# Patient Record
Sex: Female | Born: 1945 | Race: White | Hispanic: No | Marital: Married | Smoking: Never smoker
Health system: Southern US, Community
[De-identification: ages and names within clinical notes are randomized; demographics above are authoritative.]

## PROBLEM LIST (undated history)

## (undated) DIAGNOSIS — R011 Cardiac murmur, unspecified: Secondary | ICD-10-CM

## (undated) DIAGNOSIS — I10 Essential (primary) hypertension: Secondary | ICD-10-CM

## (undated) DIAGNOSIS — F419 Anxiety disorder, unspecified: Secondary | ICD-10-CM

## (undated) DIAGNOSIS — M1990 Unspecified osteoarthritis, unspecified site: Secondary | ICD-10-CM

## (undated) DIAGNOSIS — G43909 Migraine, unspecified, not intractable, without status migrainosus: Secondary | ICD-10-CM

## (undated) DIAGNOSIS — M810 Age-related osteoporosis without current pathological fracture: Secondary | ICD-10-CM

## (undated) DIAGNOSIS — E785 Hyperlipidemia, unspecified: Secondary | ICD-10-CM

## (undated) DIAGNOSIS — K649 Unspecified hemorrhoids: Secondary | ICD-10-CM

## (undated) HISTORY — DX: Unspecified osteoarthritis, unspecified site: M19.90

## (undated) HISTORY — PX: CERVICAL FUSION: SHX112

## (undated) HISTORY — DX: Migraine, unspecified, not intractable, without status migrainosus: G43.909

## (undated) HISTORY — DX: Essential (primary) hypertension: I10

## (undated) HISTORY — DX: Age-related osteoporosis without current pathological fracture: M81.0

## (undated) HISTORY — DX: Unspecified hemorrhoids: K64.9

## (undated) HISTORY — PX: UPPER GASTROINTESTINAL ENDOSCOPY: SHX188

## (undated) HISTORY — PX: HEMORRHOID BANDING: SHX5850

## (undated) HISTORY — DX: Hyperlipidemia, unspecified: E78.5

## (undated) HISTORY — DX: Anxiety disorder, unspecified: F41.9

## (undated) HISTORY — PX: COLONOSCOPY: SHX174

## (undated) HISTORY — PX: OTHER SURGICAL HISTORY: SHX169

## (undated) HISTORY — PX: LUMBAR DISC SURGERY: SHX700

## (undated) HISTORY — DX: Cardiac murmur, unspecified: R01.1

## (undated) HISTORY — PX: BUNIONECTOMY: SHX129

## (undated) HISTORY — PX: SPINE SURGERY: SHX786

## (undated) HISTORY — PX: POLYPECTOMY: SHX149

---

## 2008-10-15 ENCOUNTER — Ambulatory Visit: Payer: Self-pay | Admitting: Internal Medicine

## 2008-10-23 ENCOUNTER — Encounter: Payer: Self-pay | Admitting: Internal Medicine

## 2008-10-23 ENCOUNTER — Ambulatory Visit: Payer: Self-pay | Admitting: Internal Medicine

## 2008-10-26 ENCOUNTER — Encounter: Payer: Self-pay | Admitting: Internal Medicine

## 2009-04-05 ENCOUNTER — Encounter: Admission: RE | Admit: 2009-04-05 | Discharge: 2009-04-05 | Payer: Self-pay | Admitting: Chiropractic Medicine

## 2010-10-24 ENCOUNTER — Ambulatory Visit (AMBULATORY_SURGERY_CENTER): Payer: Managed Care, Other (non HMO) | Admitting: *Deleted

## 2010-10-24 ENCOUNTER — Encounter: Payer: Self-pay | Admitting: Internal Medicine

## 2010-10-24 VITALS — Ht 63.0 in | Wt 107.0 lb

## 2010-10-24 DIAGNOSIS — Z86010 Personal history of colonic polyps: Secondary | ICD-10-CM

## 2010-10-24 MED ORDER — PEG-KCL-NACL-NASULF-NA ASC-C 100 G PO SOLR: ORAL | Status: DC

## 2010-11-07 ENCOUNTER — Encounter: Payer: Self-pay | Admitting: Internal Medicine

## 2010-11-07 ENCOUNTER — Ambulatory Visit (AMBULATORY_SURGERY_CENTER): Payer: Managed Care, Other (non HMO) | Admitting: Internal Medicine

## 2010-11-07 VITALS — BP 129/82 | HR 58 | Temp 95.6°F | Resp 17 | Ht 63.0 in | Wt 105.0 lb

## 2010-11-07 DIAGNOSIS — Z1211 Encounter for screening for malignant neoplasm of colon: Secondary | ICD-10-CM

## 2010-11-07 DIAGNOSIS — Z85038 Personal history of other malignant neoplasm of large intestine: Secondary | ICD-10-CM

## 2010-11-07 DIAGNOSIS — D126 Benign neoplasm of colon, unspecified: Secondary | ICD-10-CM

## 2010-11-07 DIAGNOSIS — Z800 Family history of malignant neoplasm of digestive organs: Secondary | ICD-10-CM

## 2010-11-07 DIAGNOSIS — Z86010 Personal history of colonic polyps: Secondary | ICD-10-CM

## 2010-11-07 MED ORDER — SODIUM CHLORIDE 0.9 % IV SOLN: 500.0000 mL | INTRAVENOUS | Status: DC

## 2010-11-07 NOTE — Patient Instructions (Signed)
Discharged instructions given with verbal understanding. Hand outs on polyps and diverticulosis given. Resume previous medications. 

## 2010-11-10 ENCOUNTER — Telehealth: Payer: Self-pay | Admitting: *Deleted

## 2010-11-10 NOTE — Telephone Encounter (Signed)

## 2011-04-14 ENCOUNTER — Other Ambulatory Visit: Payer: Self-pay | Admitting: Rehabilitation

## 2011-04-14 DIAGNOSIS — M542 Cervicalgia: Secondary | ICD-10-CM

## 2011-04-20 ENCOUNTER — Ambulatory Visit
Admission: RE | Admit: 2011-04-20 | Discharge: 2011-04-20 | Disposition: A | Discharge status: Home / Self Care | Payer: Managed Care, Other (non HMO) | Source: Ambulatory Visit | Attending: Rehabilitation | Admitting: Rehabilitation

## 2011-04-20 DIAGNOSIS — M542 Cervicalgia: Secondary | ICD-10-CM

## 2013-05-10 DIAGNOSIS — L03019 Cellulitis of unspecified finger: Secondary | ICD-10-CM | POA: Insufficient documentation

## 2013-08-28 ENCOUNTER — Encounter: Payer: Self-pay | Admitting: Internal Medicine

## 2013-10-23 ENCOUNTER — Ambulatory Visit (AMBULATORY_SURGERY_CENTER): Payer: Self-pay

## 2013-10-23 VITALS — Ht 63.0 in | Wt 105.0 lb

## 2013-10-23 DIAGNOSIS — Z860100 Personal history of colon polyps, unspecified: Secondary | ICD-10-CM

## 2013-10-23 MED ORDER — MOVIPREP 100 G PO SOLR: 1.0000 | Freq: Once | ORAL | Status: DC

## 2013-10-23 NOTE — Progress Notes (Signed)
No past problems with anesthesia No home oxygen No diet/weight loss meds No allergies to eggs or soy Has email. Emmi instructions given for colonoscopy

## 2013-10-24 ENCOUNTER — Telehealth: Payer: Self-pay | Admitting: Internal Medicine

## 2013-10-24 NOTE — Telephone Encounter (Signed)
Called pt back, pt states insurance did not cover prep and had a list that they would cover, advised pt I could give her a prep free if she wanted to come by and pick it up from 4th floor, pt states she will pick it up Thursday 10/26/13, left prep with pt's name on it at 4th floor front desk.-adm

## 2013-11-06 ENCOUNTER — Encounter: Payer: Self-pay | Admitting: Internal Medicine

## 2013-11-06 ENCOUNTER — Ambulatory Visit (AMBULATORY_SURGERY_CENTER): Payer: Medicare Other | Admitting: Internal Medicine

## 2013-11-06 VITALS — BP 111/75 | HR 52 | Temp 96.0°F | Resp 10 | Ht 63.0 in | Wt 105.0 lb

## 2013-11-06 DIAGNOSIS — D128 Benign neoplasm of rectum: Secondary | ICD-10-CM

## 2013-11-06 DIAGNOSIS — Z86010 Personal history of colonic polyps: Secondary | ICD-10-CM

## 2013-11-06 DIAGNOSIS — D129 Benign neoplasm of anus and anal canal: Secondary | ICD-10-CM

## 2013-11-06 DIAGNOSIS — D126 Benign neoplasm of colon, unspecified: Secondary | ICD-10-CM

## 2013-11-06 MED ORDER — SODIUM CHLORIDE 0.9 % IV SOLN: 500.0000 mL | INTRAVENOUS | Status: DC

## 2013-11-06 NOTE — Progress Notes (Signed)
Report to pacu rn, vss, bbs=clear 

## 2013-11-06 NOTE — Progress Notes (Signed)
Called to room to assist during endoscopic procedure.  Patient ID and intended procedure confirmed with present staff. Received instructions for my participation in the procedure from the performing physician.  

## 2013-11-06 NOTE — Patient Instructions (Signed)
Discharge instructions given with verbal understanding. Handouts on polyps and diverticulosis. Resume previous medications. YOU HAD AN ENDOSCOPIC PROCEDURE TODAY AT THE [facility]: Refer to the procedure report that was given to you for any specific questions about what was found during the examination.  If the procedure report does not answer your questions, please call your gastroenterologist to clarify.  If you requested that your care partner not be given the details of your procedure findings, then the procedure report has been included in a sealed envelope for you to review at your convenience later.  YOU SHOULD EXPECT: Some feelings of bloating in the abdomen. Passage of more gas than usual.  Walking can help get rid of the air that was put into your GI tract during the procedure and reduce the bloating. If you had a lower endoscopy (such as a colonoscopy or flexible sigmoidoscopy) you may notice spotting of blood in your stool or on the toilet paper. If you underwent a bowel prep for your procedure, then you may not have a normal bowel movement for a few days.  DIET: Your first meal following the procedure should be a light meal and then it is ok to progress to your normal diet.  A half-sandwich or bowl of soup is an example of a good first meal.  Heavy or fried foods are harder to digest and may make you feel nauseous or bloated.  Likewise meals heavy in dairy and vegetables can cause extra gas to form and this can also increase the bloating.  Drink plenty of fluids but you should avoid alcoholic beverages for 24 hours.  ACTIVITY: Your care partner should take you home directly after the procedure.  You should plan to take it easy, moving slowly for the rest of the day.  You can resume normal activity the day after the procedure however you should NOT DRIVE or use heavy machinery for 24 hours (because of the sedation medicines used during the test).    SYMPTOMS TO REPORT  IMMEDIATELY: A gastroenterologist can be reached at any hour.  During normal business hours, 8:30 AM to 5:00 PM Monday through Friday, call [phone].  After hours and on weekends, please call the GI answering service at [phone] who will take a message and have the physician on call contact you.   Following lower endoscopy (colonoscopy or flexible sigmoidoscopy):  Excessive amounts of blood in the stool  Significant tenderness or worsening of abdominal pains  Swelling of the abdomen that is new, acute  Fever of 100F or higher  FOLLOW UP: If any biopsies were taken you will be contacted by phone or by letter within the next 1-3 weeks.  Call your gastroenterologist if you have not heard about the biopsies in 3 weeks.  Our staff will call the home number listed on your records the next business day following your procedure to check on you and address any questions or concerns that you may have at that time regarding the information given to you following your procedure. This is a courtesy call and so if there is no answer at the home number and we have not heard from you through the emergency physician on call, we will assume that you have returned to your regular daily activities without incident.  SIGNATURES/CONFIDENTIALITY: You and/or your care partner have signed paperwork which will be entered into your electronic medical record.  These signatures attest to the fact that that the information above on your After Visit Summary   has been reviewed and is understood.  Full responsibility of the confidentiality of this discharge information lies with you and/or your care-partner. 

## 2013-11-06 NOTE — Op Note (Signed)
[facility]  [address] [place], [id number]   COLONOSCOPY PROCEDURE REPORT  PATIENT: [name], Katelyn  MR#: [id number] BIRTHDATE: 1945/07/05 , 68  yrs. old GENDER: Female ENDOSCOPIST: Eustace Quail, MD REFERRED LF:[id number] Program Recall PROCEDURE DATE:  11/06/2013 PROCEDURE:   Colonoscopy with snare polypectomy x 4 First Screening Colonoscopy - Avg.  risk and is 50 yrs.  old or older - No.  Prior Negative Screening - Now for repeat screening. N/A  History of Adenoma - Now for follow-up colonoscopy & has been > or = to 3 yrs.  Yes hx of adenoma.  Has been 3 or more years since last colonoscopy.  Polyps Removed Today? Yes. ASA CLASS:   Class II INDICATIONS:Patient's personal history of adenomatous colon polyps. Multiple prior colonoscopies with multiple adenomas in [place] and [place].Marland Kitchen Last exam May 2012. Mother with colon cancer at age 67. MEDICATIONS: MAC sedation, administered by CRNA and propofol (Diprivan) 300mg  IV  DESCRIPTION OF PROCEDURE:   After the risks benefits and alternatives of the procedure were thoroughly explained, informed consent was obtained.  A digital rectal exam revealed hemorrhoids. The [id number]  endoscope was introduced through the anus and advanced to the cecum, which was identified by both the appendix and ileocecal valve. No adverse events experienced.   The quality of the prep was good, using MoviPrep  The instrument was then slowly withdrawn as the colon was fully examined.  COLON FINDINGS: Four polyps were found at the cecum, in the ascending colon, and transverse colon.  A polypectomy was performed with a cold snare.  The resection was complete and the polyp tissue was completely retrieved.   Mild diverticulosis was noted.   The colon mucosa was otherwise normal.  Retroflexed views revealed internal hemorrhoids. The time to cecum=4 minutes 30 seconds. Withdrawal time=16 minutes 35 seconds.  The scope was withdrawn  and the procedure completed. COMPLICATIONS: There were no complications.  ENDOSCOPIC IMPRESSION: 1.   Four polyps were found in the colon; polypectomy was performed with a cold snare 2.   Mild diverticulosis was noted 3.   The colon mucosa was otherwise normal  RECOMMENDATIONS: 1. Repeat Colonoscopy in 3 years.   eSigned:  Eustace Quail, MD 11/06/2013 9:42 AM   cc: Florina Ou, MD and The Patient

## 2013-11-07 ENCOUNTER — Telehealth: Payer: Self-pay

## 2013-11-07 NOTE — Telephone Encounter (Signed)
  Follow up Call-  Call back number 11/06/2013  Post procedure Call Back phone  # [phone]  Permission to leave phone message Yes     Patient questions:  Do you have a fever, pain , or abdominal swelling? no Pain Score  0 *  Have you tolerated food without any problems? yes  Have you been able to return to your normal activities? yes  Do you have any questions about your discharge instructions: Diet   no Medications  no Follow up visit  no  Do you have questions or concerns about your Care? no  Actions: * If pain score is 4 or above: No action needed, pain <4.   No problems per the pt. maw

## 2013-11-10 ENCOUNTER — Encounter: Payer: Self-pay | Admitting: Internal Medicine

## 2014-04-12 ENCOUNTER — Other Ambulatory Visit: Payer: Self-pay | Admitting: Orthopaedic Surgery

## 2014-04-12 DIAGNOSIS — M4712 Other spondylosis with myelopathy, cervical region: Secondary | ICD-10-CM

## 2014-04-21 ENCOUNTER — Ambulatory Visit
Admission: RE | Admit: 2014-04-21 | Discharge: 2014-04-21 | Disposition: A | Discharge status: Home / Self Care | Payer: Medicare Other | Source: Ambulatory Visit | Attending: Orthopaedic Surgery | Admitting: Orthopaedic Surgery

## 2014-04-21 DIAGNOSIS — M4712 Other spondylosis with myelopathy, cervical region: Secondary | ICD-10-CM

## 2014-05-31 ENCOUNTER — Encounter: Payer: Self-pay | Admitting: *Deleted

## 2014-08-01 DIAGNOSIS — M4712 Other spondylosis with myelopathy, cervical region: Secondary | ICD-10-CM | POA: Diagnosis not present

## 2014-08-29 DIAGNOSIS — M5416 Radiculopathy, lumbar region: Secondary | ICD-10-CM | POA: Diagnosis not present

## 2014-08-29 DIAGNOSIS — M4726 Other spondylosis with radiculopathy, lumbar region: Secondary | ICD-10-CM | POA: Diagnosis not present

## 2014-08-29 DIAGNOSIS — M47892 Other spondylosis, cervical region: Secondary | ICD-10-CM | POA: Diagnosis not present

## 2014-08-30 ENCOUNTER — Other Ambulatory Visit: Payer: Self-pay | Admitting: Rehabilitation

## 2014-08-30 DIAGNOSIS — M545 Low back pain: Secondary | ICD-10-CM

## 2014-09-05 DIAGNOSIS — M5126 Other intervertebral disc displacement, lumbar region: Secondary | ICD-10-CM | POA: Diagnosis not present

## 2014-09-05 DIAGNOSIS — M47816 Spondylosis without myelopathy or radiculopathy, lumbar region: Secondary | ICD-10-CM | POA: Diagnosis not present

## 2014-09-05 DIAGNOSIS — M419 Scoliosis, unspecified: Secondary | ICD-10-CM | POA: Diagnosis not present

## 2014-09-10 DIAGNOSIS — M47892 Other spondylosis, cervical region: Secondary | ICD-10-CM | POA: Diagnosis not present

## 2014-09-10 DIAGNOSIS — M4726 Other spondylosis with radiculopathy, lumbar region: Secondary | ICD-10-CM | POA: Diagnosis not present

## 2014-09-10 DIAGNOSIS — M961 Postlaminectomy syndrome, not elsewhere classified: Secondary | ICD-10-CM | POA: Diagnosis not present

## 2014-09-10 DIAGNOSIS — M545 Low back pain: Secondary | ICD-10-CM | POA: Diagnosis not present

## 2014-09-19 DIAGNOSIS — M5416 Radiculopathy, lumbar region: Secondary | ICD-10-CM | POA: Diagnosis not present

## 2014-09-20 ENCOUNTER — Other Ambulatory Visit: Payer: Medicare Other

## 2014-09-24 ENCOUNTER — Ambulatory Visit (INDEPENDENT_AMBULATORY_CARE_PROVIDER_SITE_OTHER): Payer: BLUE CROSS/BLUE SHIELD | Admitting: Family Medicine

## 2014-09-24 ENCOUNTER — Encounter: Payer: Self-pay | Admitting: Family Medicine

## 2014-09-24 VITALS — BP 100/70 | HR 60 | Temp 97.7°F | Resp 14 | Ht 64.0 in | Wt 106.0 lb

## 2014-09-24 DIAGNOSIS — Z7689 Persons encountering health services in other specified circumstances: Secondary | ICD-10-CM

## 2014-09-24 DIAGNOSIS — M4712 Other spondylosis with myelopathy, cervical region: Secondary | ICD-10-CM | POA: Diagnosis not present

## 2014-09-24 DIAGNOSIS — M810 Age-related osteoporosis without current pathological fracture: Secondary | ICD-10-CM

## 2014-09-24 DIAGNOSIS — Z0000 Encounter for general adult medical examination without abnormal findings: Secondary | ICD-10-CM | POA: Diagnosis not present

## 2014-09-24 DIAGNOSIS — M4126 Other idiopathic scoliosis, lumbar region: Secondary | ICD-10-CM | POA: Diagnosis not present

## 2014-09-24 DIAGNOSIS — Z23 Encounter for immunization: Secondary | ICD-10-CM

## 2014-09-24 DIAGNOSIS — Z7189 Other specified counseling: Secondary | ICD-10-CM | POA: Diagnosis not present

## 2014-09-24 DIAGNOSIS — M4726 Other spondylosis with radiculopathy, lumbar region: Secondary | ICD-10-CM | POA: Diagnosis not present

## 2014-09-24 DIAGNOSIS — E785 Hyperlipidemia, unspecified: Secondary | ICD-10-CM | POA: Diagnosis not present

## 2014-09-24 MED ORDER — ZOLPIDEM TARTRATE 10 MG PO TABS: 10.0000 mg | ORAL_TABLET | Freq: Every evening | ORAL | Status: DC | PRN

## 2014-09-24 NOTE — Progress Notes (Signed)
Subjective:    Patient ID: Katelyn [name], female    DOB: [birth year], 69 y.o.   MRN: [id number]  HPI Patient is a very pleasant 69 year old white female who is here today to establish care. Her only concern is her chronic neck pain. She's had 2 surgeries on her neck. The most recent surgery was performed in November 2015 by Dr. Patrice Paradise.  She is currently using Dilaudid 2 mg by mouth twice a day for pain. She is tried anti-inflammatories in the past, muscle relaxers, Cymbalta, Lyrica, and gabapentin without benefit. 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 It is multilevel degenerative disc disease with the most significant impingement at L4. She recently underwent epidural steroid injection at L4 with minimal benefit. She is scheduled to see her neurosurgeon next week to discuss her next option. Her past medical history is also significant for osteoporosis, hypertension, hyperlipidemia. Her most recent colonoscopy was in 2015. They found 4 polyps. She is due again in 2018. Her mammogram is not due until this summer. She is due for repeat bone density. Her last Pap smear was 2 years ago and is not due again until next year. Past Medical History  Diagnosis Date  . Arthritis   . Hypertension   . Hyperlipidemia   . Osteoporosis    Past Surgical History  Procedure Laterality Date  . Colonoscopy    . Polypectomy    . Upper gastrointestinal endoscopy    . Bunionectomy      right  . Cervical fusion      C5-6-7  . Spine surgery     Current Outpatient Prescriptions on File Prior to Visit  Medication Sig Dispense Refill  . Ascorbic Acid (VITAMIN C) 500 MG tablet Take 500 mg by mouth daily.      Marland Kitchen aspirin 81 MG tablet Take 81 mg by mouth daily.      . B Complex Vitamins (VITAMIN B COMPLEX PO) Take 1 tablet by mouth daily.      . calcium carbonate (OS-CAL) 600 MG TABS Take 600 mg by mouth 2 (two) times daily with a meal.      . fish oil-omega-3 fatty acids 1000 MG capsule Take 1  g by mouth 2 (two) times daily.      . Garlic 6712 MG CAPS Take 1 capsule by mouth daily.      Marland Kitchen glucosamine-chondroitin 500-400 MG tablet Take 1 tablet by mouth daily.      . hydrochlorothiazide 25 MG tablet Take 25 mg by mouth daily.      Marland Kitchen Lysine 500 MG CAPS Take 1 capsule by mouth daily.      . magnesium oxide (MAG-OX) 400 MG tablet Take 400 mg by mouth daily.      . Multiple Vitamins-Minerals (MULTIVITAMIN WITH MINERALS) tablet Take 1 tablet by mouth daily.      Marland Kitchen POTASSIUM GLUCONATE PO Take 1 tablet by mouth daily.      . simvastatin (ZOCOR) 20 MG tablet Take 20 mg by mouth at bedtime.      . SUMAtriptan (IMITREX) 100 MG tablet Take 50 mg by mouth every 2 (two) hours as needed for migraine or headache (cutsx tablet in half). May repeat in 2 hours if headache persists or recurs.    . vitamin E 400 UNIT capsule Take 400 Units by mouth daily.       No current facility-administered medications on file prior to visit.   Allergies  Allergen Reactions  . Prednisone Hives  .  Hydrocodone Palpitations  . Red Dye Rash   History   Social History  . Marital Status: Married    Spouse Name: N/A  . Number of Children: N/A  . Years of Education: N/A   Occupational History  . Not on file.   Social History Main Topics  . Smoking status: Never Smoker   . Smokeless tobacco: Never Used  . Alcohol Use: 4.2 oz/week    7 Glasses of wine per week  . Drug Use: No  . Sexual Activity: Yes   Other Topics Concern  . Not on file   Social History Narrative   Family History  Problem Relation Age of Onset  . Colon polyps Mother   . Arthritis Mother   . Hearing loss Mother   . Hyperlipidemia Mother   . Hypertension Mother   . Stroke Mother   . Colon polyps Father   . Arthritis Father   . Heart disease Father   . Hyperlipidemia Father   . Colon cancer Neg Hx   . Pancreatic cancer Neg Hx   . Stomach cancer Neg Hx       Review of Systems  All other systems reviewed and are  negative.      Objective:   Physical Exam  Constitutional: She is oriented to person, place, and time. She appears well-developed and well-nourished. No distress.  HENT:  Head: Normocephalic and atraumatic.  Right Ear: External ear normal.  Left Ear: External ear normal.  Nose: Nose normal.  Mouth/Throat: Oropharynx is clear and moist. No oropharyngeal exudate.  Eyes: Conjunctivae and EOM are normal. Pupils are equal, round, and reactive to light. Right eye exhibits no discharge. Left eye exhibits no discharge. No scleral icterus.  Neck: Normal range of motion. Neck supple. No JVD present. No tracheal deviation present. No thyromegaly present.  Cardiovascular: Normal rate, regular rhythm and intact distal pulses.  Exam reveals no gallop and no friction rub.   No murmur heard. Pulmonary/Chest: Effort normal and breath sounds normal. No stridor. No respiratory distress. She has no wheezes. She has no rales. She exhibits no tenderness.  Abdominal: Soft. Bowel sounds are normal. She exhibits no distension and no mass. There is no tenderness. There is no rebound and no guarding.  Musculoskeletal: Normal range of motion. She exhibits no edema or tenderness.  Lymphadenopathy:    She has no cervical adenopathy.  Neurological: She is alert and oriented to person, place, and time. She has normal reflexes. She displays normal reflexes. No cranial nerve deficit. She exhibits normal muscle tone. Coordination normal.  Skin: Skin is warm. No rash noted. She is not diaphoretic. No erythema. No pallor.  Psychiatric: She has a normal mood and affect. Her behavior is normal. Judgment and thought content normal.  Vitals reviewed.         Assessment & Plan:  Establishing care with new doctor, encounter for - Plan: CBC with Differential/Platelet, COMPLETE METABOLIC PANEL WITH GFR, Lipid panel, Vit D  25 hydroxy (rtn osteoporosis monitoring), TSH  Routine general medical examination at a health care  facility - Plan: CBC with Differential/Platelet, COMPLETE METABOLIC PANEL WITH GFR, Lipid panel, Vit D  25 hydroxy (rtn osteoporosis monitoring), TSH  HLD (hyperlipidemia) - Plan: COMPLETE METABOLIC PANEL WITH GFR, Lipid panel  Osteoporosis - Plan: Vit D  25 hydroxy (rtn osteoporosis monitoring), TSH, DG Bone Density  Need for prophylactic vaccination against Streptococcus pneumoniae (pneumococcus) - Plan: Pneumococcal polysaccharide vaccine 23-valent greater than or equal to 2yo subcutaneous/IM  Patient's  physical exam today is normal. I will schedule the patient for a bone density test. I will also check a CBC, CMP, fasting lipid panel, TSH, and vitamin D level. Patient received Pneumovax 23 today. I will give her Prevnar 37 Year. Her shingles vaccine is up-to-date. We did discuss the tetanus shot but she declined it at the present time.  I will gladly refill her Dilaudid in the future if necessary depending upon what she decides with her orthopedic surgeon.

## 2014-09-25 ENCOUNTER — Other Ambulatory Visit: Payer: Self-pay | Admitting: Orthopaedic Surgery

## 2014-09-25 DIAGNOSIS — M4712 Other spondylosis with myelopathy, cervical region: Secondary | ICD-10-CM

## 2014-09-25 LAB — COMPLETE METABOLIC PANEL WITH GFR
ALK PHOS: 73 U/L (ref 39–117)
ALT: 26 U/L (ref 0–35)
AST: 24 U/L (ref 0–37)
Albumin: 4.5 g/dL (ref 3.5–5.2)
BILIRUBIN TOTAL: 0.5 mg/dL (ref 0.2–1.2)
BUN: 13 mg/dL (ref 6–23)
CO2: 31 mEq/L (ref 19–32)
Calcium: 10.1 mg/dL (ref 8.4–10.5)
Chloride: 100 mEq/L (ref 96–112)
Creat: 0.65 mg/dL (ref 0.50–1.10)
GFR, Est African American: >89 mL/min
GFR, Est Non African American: >89 mL/min
Glucose, Bld: 90 mg/dL (ref 70–99)
POTASSIUM: 4.7 meq/L (ref 3.5–5.3)
SODIUM: 139 meq/L (ref 135–145)
Total Protein: 7 g/dL (ref 6.0–8.3)

## 2014-09-25 LAB — LIPID PANEL
CHOL/HDL RATIO: 1.9 ratio
CHOLESTEROL: 225 mg/dL — AB (ref 0–200)
HDL: 120 mg/dL (ref >=46)
LDL Cholesterol: 91 mg/dL (ref 0–99)
Triglycerides: 68 mg/dL (ref <150)
VLDL: 14 mg/dL (ref 0–40)

## 2014-09-25 LAB — CBC WITH DIFFERENTIAL/PLATELET
BASOS ABS: 0.1 10*3/uL (ref 0.0–0.1)
Basophils Relative: 1 % (ref 0–1)
EOS PCT: 2 % (ref 0–5)
Eosinophils Absolute: 0.1 10*3/uL (ref 0.0–0.7)
HEMATOCRIT: 42 % (ref 36.0–46.0)
HEMOGLOBIN: 14.1 g/dL (ref 12.0–15.0)
LYMPHS PCT: 40 % (ref 12–46)
Lymphs Abs: 2 10*3/uL (ref 0.7–4.0)
MCH: 30.5 pg (ref 26.0–34.0)
MCHC: 33.6 g/dL (ref 30.0–36.0)
MCV: 90.7 fL (ref 78.0–100.0)
MONOS PCT: 9 % (ref 3–12)
MPV: 10.4 fL (ref 8.6–12.4)
Monocytes Absolute: 0.5 10*3/uL (ref 0.1–1.0)
Neutro Abs: 2.4 10*3/uL (ref 1.7–7.7)
Neutrophils Relative %: 48 % (ref 43–77)
PLATELETS: 247 10*3/uL (ref 150–400)
RBC: 4.63 MIL/uL (ref 3.87–5.11)
RDW: 14.3 % (ref 11.5–15.5)
WBC: 5.1 10*3/uL (ref 4.0–10.5)

## 2014-09-25 LAB — VITAMIN D 25 HYDROXY (VIT D DEFICIENCY, FRACTURES): Vit D, 25-Hydroxy: 50 ng/mL (ref 30–100)

## 2014-09-25 LAB — TSH: TSH: 0.641 u[IU]/mL (ref 0.350–4.500)

## 2014-09-26 ENCOUNTER — Ambulatory Visit
Admission: RE | Admit: 2014-09-26 | Discharge: 2014-09-26 | Disposition: A | Discharge status: Home / Self Care | Payer: Medicare Other | Source: Ambulatory Visit | Attending: Orthopaedic Surgery | Admitting: Orthopaedic Surgery

## 2014-09-26 ENCOUNTER — Encounter: Payer: Self-pay | Admitting: Family Medicine

## 2014-09-26 DIAGNOSIS — M4713 Other spondylosis with myelopathy, cervicothoracic region: Secondary | ICD-10-CM | POA: Diagnosis not present

## 2014-09-26 DIAGNOSIS — M4322 Fusion of spine, cervical region: Secondary | ICD-10-CM | POA: Diagnosis not present

## 2014-09-26 DIAGNOSIS — M4712 Other spondylosis with myelopathy, cervical region: Secondary | ICD-10-CM

## 2014-10-04 DIAGNOSIS — M419 Scoliosis, unspecified: Secondary | ICD-10-CM | POA: Diagnosis not present

## 2014-10-04 DIAGNOSIS — M542 Cervicalgia: Secondary | ICD-10-CM | POA: Diagnosis not present

## 2014-10-04 DIAGNOSIS — M5001 Cervical disc disorder with myelopathy,  high cervical region: Secondary | ICD-10-CM | POA: Diagnosis not present

## 2014-10-09 DIAGNOSIS — M47816 Spondylosis without myelopathy or radiculopathy, lumbar region: Secondary | ICD-10-CM | POA: Diagnosis not present

## 2014-10-16 DIAGNOSIS — M47816 Spondylosis without myelopathy or radiculopathy, lumbar region: Secondary | ICD-10-CM | POA: Diagnosis not present

## 2014-10-17 DIAGNOSIS — M542 Cervicalgia: Secondary | ICD-10-CM | POA: Diagnosis not present

## 2014-10-17 DIAGNOSIS — M150 Primary generalized (osteo)arthritis: Secondary | ICD-10-CM | POA: Diagnosis not present

## 2014-10-17 DIAGNOSIS — M6249 Contracture of muscle, multiple sites: Secondary | ICD-10-CM | POA: Diagnosis not present

## 2014-10-17 DIAGNOSIS — M6281 Muscle weakness (generalized): Secondary | ICD-10-CM | POA: Diagnosis not present

## 2014-10-23 DIAGNOSIS — M6281 Muscle weakness (generalized): Secondary | ICD-10-CM | POA: Diagnosis not present

## 2014-10-23 DIAGNOSIS — M150 Primary generalized (osteo)arthritis: Secondary | ICD-10-CM | POA: Diagnosis not present

## 2014-10-23 DIAGNOSIS — M6249 Contracture of muscle, multiple sites: Secondary | ICD-10-CM | POA: Diagnosis not present

## 2014-10-23 DIAGNOSIS — M542 Cervicalgia: Secondary | ICD-10-CM | POA: Diagnosis not present

## 2014-10-24 DIAGNOSIS — M47816 Spondylosis without myelopathy or radiculopathy, lumbar region: Secondary | ICD-10-CM | POA: Diagnosis not present

## 2014-10-24 DIAGNOSIS — M545 Low back pain: Secondary | ICD-10-CM | POA: Diagnosis not present

## 2014-10-30 DIAGNOSIS — M542 Cervicalgia: Secondary | ICD-10-CM | POA: Diagnosis not present

## 2014-10-30 DIAGNOSIS — M6281 Muscle weakness (generalized): Secondary | ICD-10-CM | POA: Diagnosis not present

## 2014-10-30 DIAGNOSIS — M6249 Contracture of muscle, multiple sites: Secondary | ICD-10-CM | POA: Diagnosis not present

## 2014-10-30 DIAGNOSIS — M150 Primary generalized (osteo)arthritis: Secondary | ICD-10-CM | POA: Diagnosis not present

## 2014-11-01 DIAGNOSIS — M6249 Contracture of muscle, multiple sites: Secondary | ICD-10-CM | POA: Diagnosis not present

## 2014-11-01 DIAGNOSIS — M150 Primary generalized (osteo)arthritis: Secondary | ICD-10-CM | POA: Diagnosis not present

## 2014-11-01 DIAGNOSIS — M542 Cervicalgia: Secondary | ICD-10-CM | POA: Diagnosis not present

## 2014-11-01 DIAGNOSIS — M6281 Muscle weakness (generalized): Secondary | ICD-10-CM | POA: Diagnosis not present

## 2014-11-05 DIAGNOSIS — M150 Primary generalized (osteo)arthritis: Secondary | ICD-10-CM | POA: Diagnosis not present

## 2014-11-05 DIAGNOSIS — M6281 Muscle weakness (generalized): Secondary | ICD-10-CM | POA: Diagnosis not present

## 2014-11-05 DIAGNOSIS — M6249 Contracture of muscle, multiple sites: Secondary | ICD-10-CM | POA: Diagnosis not present

## 2014-11-05 DIAGNOSIS — M542 Cervicalgia: Secondary | ICD-10-CM | POA: Diagnosis not present

## 2014-11-12 ENCOUNTER — Encounter: Payer: Self-pay | Admitting: *Deleted

## 2014-11-28 DIAGNOSIS — Z79899 Other long term (current) drug therapy: Secondary | ICD-10-CM | POA: Diagnosis not present

## 2014-11-28 DIAGNOSIS — M47816 Spondylosis without myelopathy or radiculopathy, lumbar region: Secondary | ICD-10-CM | POA: Diagnosis not present

## 2014-11-28 DIAGNOSIS — M461 Sacroiliitis, not elsewhere classified: Secondary | ICD-10-CM | POA: Diagnosis not present

## 2014-11-28 DIAGNOSIS — G894 Chronic pain syndrome: Secondary | ICD-10-CM | POA: Diagnosis not present

## 2014-11-28 DIAGNOSIS — Z79891 Long term (current) use of opiate analgesic: Secondary | ICD-10-CM | POA: Diagnosis not present

## 2014-11-28 DIAGNOSIS — M542 Cervicalgia: Secondary | ICD-10-CM | POA: Diagnosis not present

## 2014-12-03 ENCOUNTER — Telehealth: Payer: Self-pay | Admitting: *Deleted

## 2014-12-03 ENCOUNTER — Telehealth: Payer: Self-pay | Admitting: Family Medicine

## 2014-12-03 ENCOUNTER — Other Ambulatory Visit: Payer: Self-pay

## 2014-12-03 ENCOUNTER — Other Ambulatory Visit: Payer: Self-pay | Admitting: Family Medicine

## 2014-12-03 MED ORDER — HYDROCHLOROTHIAZIDE 25 MG PO TABS: 25.0000 mg | ORAL_TABLET | Freq: Every day | ORAL | Status: DC

## 2014-12-03 MED ORDER — SUMATRIPTAN SUCCINATE 100 MG PO TABS
50.0000 mg | ORAL_TABLET | ORAL | Status: DC | PRN
Start: 2014-12-03 — End: 2014-12-03

## 2014-12-03 MED ORDER — SIMVASTATIN 20 MG PO TABS: 20.0000 mg | ORAL_TABLET | Freq: Every day | ORAL | Status: DC

## 2014-12-03 MED ORDER — SUMATRIPTAN SUCCINATE 100 MG PO TABS: 50.0000 mg | ORAL_TABLET | ORAL | Status: DC | PRN

## 2014-12-03 NOTE — Telephone Encounter (Signed)
Pt called stating that she has appt scheduled at [facility] on September 12 and wanted to have orders faxed to them before her appt. Order was faxed today.

## 2014-12-03 NOTE — Telephone Encounter (Signed)
Medication called/sent to requested pharmacy  

## 2014-12-03 NOTE — Telephone Encounter (Signed)
Patient is calling to ask if she can get referral for a bone density test if possible  [phone]

## 2014-12-03 NOTE — Telephone Encounter (Signed)
See phone noted dated 12/03/14

## 2014-12-03 NOTE — Telephone Encounter (Signed)
[phone] Pt is needing refills on hydrochlorothiazide 25 MG tablet  simvastatin (ZOCOR) 20 MG tablet  SUMAtriptan (IMITREX) 100 MG tablet [facility] [place]

## 2014-12-10 ENCOUNTER — Other Ambulatory Visit: Payer: Self-pay | Admitting: Family Medicine

## 2014-12-10 ENCOUNTER — Telehealth: Payer: Self-pay | Admitting: Family Medicine

## 2014-12-10 MED ORDER — SUMATRIPTAN SUCCINATE 100 MG PO TABS: 50.0000 mg | ORAL_TABLET | ORAL | Status: DC | PRN

## 2014-12-10 NOTE — Telephone Encounter (Signed)
Medication called/sent to requested pharmacy  

## 2014-12-10 NOTE — Telephone Encounter (Signed)
Patient states that she needs her imitrex called into [facility] in [place].

## 2015-01-01 DIAGNOSIS — M5124 Other intervertebral disc displacement, thoracic region: Secondary | ICD-10-CM | POA: Diagnosis not present

## 2015-01-01 DIAGNOSIS — M542 Cervicalgia: Secondary | ICD-10-CM | POA: Diagnosis not present

## 2015-01-01 DIAGNOSIS — M546 Pain in thoracic spine: Secondary | ICD-10-CM | POA: Diagnosis not present

## 2015-01-01 DIAGNOSIS — M47897 Other spondylosis, lumbosacral region: Secondary | ICD-10-CM | POA: Diagnosis not present

## 2015-01-28 ENCOUNTER — Telehealth: Payer: Self-pay | Admitting: Family Medicine

## 2015-01-28 DIAGNOSIS — M810 Age-related osteoporosis without current pathological fracture: Secondary | ICD-10-CM

## 2015-01-28 NOTE — Telephone Encounter (Signed)
error 

## 2015-02-04 DIAGNOSIS — M5000 Cervical disc disorder with myelopathy, unspecified cervical region: Secondary | ICD-10-CM | POA: Diagnosis not present

## 2015-02-04 DIAGNOSIS — M5032 Other cervical disc degeneration, mid-cervical region: Secondary | ICD-10-CM | POA: Diagnosis not present

## 2015-02-04 DIAGNOSIS — M47817 Spondylosis without myelopathy or radiculopathy, lumbosacral region: Secondary | ICD-10-CM | POA: Diagnosis not present

## 2015-02-20 ENCOUNTER — Other Ambulatory Visit: Payer: Self-pay | Admitting: *Deleted

## 2015-02-20 DIAGNOSIS — Z1231 Encounter for screening mammogram for malignant neoplasm of breast: Secondary | ICD-10-CM

## 2015-02-28 DIAGNOSIS — M5124 Other intervertebral disc displacement, thoracic region: Secondary | ICD-10-CM | POA: Diagnosis not present

## 2015-02-28 DIAGNOSIS — M47816 Spondylosis without myelopathy or radiculopathy, lumbar region: Secondary | ICD-10-CM | POA: Diagnosis not present

## 2015-02-28 DIAGNOSIS — M5032 Other cervical disc degeneration, mid-cervical region: Secondary | ICD-10-CM | POA: Diagnosis not present

## 2015-03-01 ENCOUNTER — Telehealth: Payer: Self-pay | Admitting: Family Medicine

## 2015-03-01 MED ORDER — SIMVASTATIN 20 MG PO TABS: 20.0000 mg | ORAL_TABLET | Freq: Every day | ORAL | Status: DC

## 2015-03-01 MED ORDER — HYDROCHLOROTHIAZIDE 25 MG PO TABS
25.0000 mg | ORAL_TABLET | Freq: Every day | ORAL | Status: DC
Start: 2015-03-01 — End: 2015-05-03

## 2015-03-01 NOTE — Telephone Encounter (Signed)
meds refilled and Dexa appt verified

## 2015-03-04 DIAGNOSIS — M810 Age-related osteoporosis without current pathological fracture: Secondary | ICD-10-CM | POA: Diagnosis not present

## 2015-03-04 DIAGNOSIS — Z1231 Encounter for screening mammogram for malignant neoplasm of breast: Secondary | ICD-10-CM | POA: Diagnosis not present

## 2015-03-04 LAB — HM DEXA SCAN

## 2015-03-04 LAB — HM MAMMOGRAPHY: HM MAMMO: NEGATIVE

## 2015-03-06 DIAGNOSIS — M5124 Other intervertebral disc displacement, thoracic region: Secondary | ICD-10-CM | POA: Diagnosis not present

## 2015-03-06 DIAGNOSIS — M5134 Other intervertebral disc degeneration, thoracic region: Secondary | ICD-10-CM | POA: Diagnosis not present

## 2015-03-06 DIAGNOSIS — M546 Pain in thoracic spine: Secondary | ICD-10-CM | POA: Diagnosis not present

## 2015-03-06 DIAGNOSIS — M4804 Spinal stenosis, thoracic region: Secondary | ICD-10-CM | POA: Diagnosis not present

## 2015-03-06 DIAGNOSIS — M47814 Spondylosis without myelopathy or radiculopathy, thoracic region: Secondary | ICD-10-CM | POA: Diagnosis not present

## 2015-03-06 DIAGNOSIS — M5032 Other cervical disc degeneration, mid-cervical region: Secondary | ICD-10-CM | POA: Diagnosis not present

## 2015-03-08 ENCOUNTER — Encounter: Payer: Self-pay | Admitting: Family Medicine

## 2015-03-14 DIAGNOSIS — M542 Cervicalgia: Secondary | ICD-10-CM | POA: Diagnosis not present

## 2015-03-14 DIAGNOSIS — M545 Low back pain: Secondary | ICD-10-CM | POA: Diagnosis not present

## 2015-03-14 DIAGNOSIS — M4126 Other idiopathic scoliosis, lumbar region: Secondary | ICD-10-CM | POA: Diagnosis not present

## 2015-03-15 ENCOUNTER — Encounter: Payer: Self-pay | Admitting: Family Medicine

## 2015-03-15 DIAGNOSIS — M1990 Unspecified osteoarthritis, unspecified site: Secondary | ICD-10-CM | POA: Insufficient documentation

## 2015-03-15 DIAGNOSIS — I10 Essential (primary) hypertension: Secondary | ICD-10-CM | POA: Insufficient documentation

## 2015-03-15 DIAGNOSIS — E785 Hyperlipidemia, unspecified: Secondary | ICD-10-CM | POA: Insufficient documentation

## 2015-03-15 DIAGNOSIS — M810 Age-related osteoporosis without current pathological fracture: Secondary | ICD-10-CM | POA: Insufficient documentation

## 2015-03-27 ENCOUNTER — Telehealth: Payer: Self-pay | Admitting: Family Medicine

## 2015-03-27 MED ORDER — ALENDRONATE SODIUM 70 MG PO TABS: 70.0000 mg | ORAL_TABLET | ORAL | Status: DC

## 2015-03-27 NOTE — Telephone Encounter (Signed)
Bone density scan done on 03/04/15 - and per Dr. Dennard Schaumann pt has osteoporosis and recommends Fosamax 70mg  q week.  Pt aware and med sent to [facility]

## 2015-04-15 DIAGNOSIS — M5124 Other intervertebral disc displacement, thoracic region: Secondary | ICD-10-CM | POA: Diagnosis not present

## 2015-04-15 DIAGNOSIS — M5001 Cervical disc disorder with myelopathy,  high cervical region: Secondary | ICD-10-CM | POA: Diagnosis not present

## 2015-04-15 DIAGNOSIS — M961 Postlaminectomy syndrome, not elsewhere classified: Secondary | ICD-10-CM | POA: Diagnosis not present

## 2015-04-15 DIAGNOSIS — M542 Cervicalgia: Secondary | ICD-10-CM | POA: Diagnosis not present

## 2015-04-16 DIAGNOSIS — M960 Pseudarthrosis after fusion or arthrodesis: Secondary | ICD-10-CM | POA: Diagnosis not present

## 2015-04-16 DIAGNOSIS — M4313 Spondylolisthesis, cervicothoracic region: Secondary | ICD-10-CM | POA: Diagnosis present

## 2015-04-16 DIAGNOSIS — M4804 Spinal stenosis, thoracic region: Secondary | ICD-10-CM | POA: Diagnosis not present

## 2015-04-16 DIAGNOSIS — M40209 Unspecified kyphosis, site unspecified: Secondary | ICD-10-CM | POA: Diagnosis present

## 2015-04-16 DIAGNOSIS — I10 Essential (primary) hypertension: Secondary | ICD-10-CM | POA: Diagnosis present

## 2015-04-16 DIAGNOSIS — M4010 Other secondary kyphosis, site unspecified: Secondary | ICD-10-CM | POA: Diagnosis not present

## 2015-04-16 DIAGNOSIS — M961 Postlaminectomy syndrome, not elsewhere classified: Secondary | ICD-10-CM | POA: Diagnosis not present

## 2015-04-16 DIAGNOSIS — R51 Headache: Secondary | ICD-10-CM | POA: Diagnosis present

## 2015-04-16 DIAGNOSIS — M5134 Other intervertebral disc degeneration, thoracic region: Secondary | ICD-10-CM | POA: Diagnosis not present

## 2015-04-16 DIAGNOSIS — E785 Hyperlipidemia, unspecified: Secondary | ICD-10-CM | POA: Diagnosis present

## 2015-04-16 DIAGNOSIS — E871 Hypo-osmolality and hyponatremia: Secondary | ICD-10-CM | POA: Diagnosis not present

## 2015-04-16 DIAGNOSIS — Z4889 Encounter for other specified surgical aftercare: Secondary | ICD-10-CM | POA: Diagnosis not present

## 2015-04-16 DIAGNOSIS — G8929 Other chronic pain: Secondary | ICD-10-CM | POA: Diagnosis present

## 2015-04-16 DIAGNOSIS — M5124 Other intervertebral disc displacement, thoracic region: Secondary | ICD-10-CM | POA: Diagnosis not present

## 2015-04-16 DIAGNOSIS — R54 Age-related physical debility: Secondary | ICD-10-CM | POA: Diagnosis not present

## 2015-04-16 DIAGNOSIS — M4800 Spinal stenosis, site unspecified: Secondary | ICD-10-CM | POA: Diagnosis not present

## 2015-04-16 DIAGNOSIS — G8928 Other chronic postprocedural pain: Secondary | ICD-10-CM | POA: Diagnosis not present

## 2015-04-16 DIAGNOSIS — M4803 Spinal stenosis, cervicothoracic region: Secondary | ICD-10-CM | POA: Diagnosis not present

## 2015-04-16 DIAGNOSIS — M47814 Spondylosis without myelopathy or radiculopathy, thoracic region: Secondary | ICD-10-CM | POA: Diagnosis present

## 2015-04-19 DIAGNOSIS — G43909 Migraine, unspecified, not intractable, without status migrainosus: Secondary | ICD-10-CM | POA: Diagnosis present

## 2015-04-19 DIAGNOSIS — T402X5A Adverse effect of other opioids, initial encounter: Secondary | ICD-10-CM | POA: Diagnosis present

## 2015-04-19 DIAGNOSIS — Z4889 Encounter for other specified surgical aftercare: Secondary | ICD-10-CM | POA: Diagnosis not present

## 2015-04-19 DIAGNOSIS — F419 Anxiety disorder, unspecified: Secondary | ICD-10-CM | POA: Diagnosis present

## 2015-04-19 DIAGNOSIS — K5903 Drug induced constipation: Secondary | ICD-10-CM | POA: Diagnosis present

## 2015-04-19 DIAGNOSIS — M40209 Unspecified kyphosis, site unspecified: Secondary | ICD-10-CM | POA: Diagnosis present

## 2015-04-19 DIAGNOSIS — I10 Essential (primary) hypertension: Secondary | ICD-10-CM | POA: Diagnosis present

## 2015-04-19 DIAGNOSIS — M4803 Spinal stenosis, cervicothoracic region: Secondary | ICD-10-CM | POA: Diagnosis not present

## 2015-04-19 DIAGNOSIS — M5134 Other intervertebral disc degeneration, thoracic region: Secondary | ICD-10-CM | POA: Diagnosis not present

## 2015-04-19 DIAGNOSIS — M4800 Spinal stenosis, site unspecified: Secondary | ICD-10-CM | POA: Diagnosis present

## 2015-04-19 DIAGNOSIS — G8929 Other chronic pain: Secondary | ICD-10-CM | POA: Diagnosis present

## 2015-04-19 DIAGNOSIS — E785 Hyperlipidemia, unspecified: Secondary | ICD-10-CM | POA: Diagnosis present

## 2015-04-19 DIAGNOSIS — M961 Postlaminectomy syndrome, not elsewhere classified: Secondary | ICD-10-CM | POA: Diagnosis present

## 2015-04-27 DIAGNOSIS — M5134 Other intervertebral disc degeneration, thoracic region: Secondary | ICD-10-CM | POA: Diagnosis not present

## 2015-04-27 DIAGNOSIS — G8929 Other chronic pain: Secondary | ICD-10-CM | POA: Diagnosis not present

## 2015-04-27 DIAGNOSIS — M4800 Spinal stenosis, site unspecified: Secondary | ICD-10-CM | POA: Diagnosis not present

## 2015-04-27 DIAGNOSIS — E785 Hyperlipidemia, unspecified: Secondary | ICD-10-CM | POA: Diagnosis not present

## 2015-04-27 DIAGNOSIS — M961 Postlaminectomy syndrome, not elsewhere classified: Secondary | ICD-10-CM | POA: Diagnosis not present

## 2015-04-27 DIAGNOSIS — M40209 Unspecified kyphosis, site unspecified: Secondary | ICD-10-CM | POA: Diagnosis not present

## 2015-04-27 DIAGNOSIS — I10 Essential (primary) hypertension: Secondary | ICD-10-CM | POA: Diagnosis not present

## 2015-04-27 DIAGNOSIS — Z4889 Encounter for other specified surgical aftercare: Secondary | ICD-10-CM | POA: Diagnosis not present

## 2015-05-01 DIAGNOSIS — Z4889 Encounter for other specified surgical aftercare: Secondary | ICD-10-CM | POA: Diagnosis not present

## 2015-05-01 DIAGNOSIS — M5134 Other intervertebral disc degeneration, thoracic region: Secondary | ICD-10-CM | POA: Diagnosis not present

## 2015-05-01 DIAGNOSIS — M4800 Spinal stenosis, site unspecified: Secondary | ICD-10-CM | POA: Diagnosis not present

## 2015-05-01 DIAGNOSIS — M961 Postlaminectomy syndrome, not elsewhere classified: Secondary | ICD-10-CM | POA: Diagnosis not present

## 2015-05-01 DIAGNOSIS — G8929 Other chronic pain: Secondary | ICD-10-CM | POA: Diagnosis not present

## 2015-05-01 DIAGNOSIS — M40209 Unspecified kyphosis, site unspecified: Secondary | ICD-10-CM | POA: Diagnosis not present

## 2015-05-03 ENCOUNTER — Encounter: Payer: Self-pay | Admitting: Family Medicine

## 2015-05-03 ENCOUNTER — Ambulatory Visit (INDEPENDENT_AMBULATORY_CARE_PROVIDER_SITE_OTHER): Payer: Medicare Other | Admitting: Family Medicine

## 2015-05-03 VITALS — BP 108/64 | HR 68 | Temp 98.5°F | Resp 16 | Ht 63.0 in | Wt 106.0 lb

## 2015-05-03 DIAGNOSIS — I10 Essential (primary) hypertension: Secondary | ICD-10-CM | POA: Diagnosis not present

## 2015-05-03 DIAGNOSIS — Z09 Encounter for follow-up examination after completed treatment for conditions other than malignant neoplasm: Secondary | ICD-10-CM

## 2015-05-03 MED ORDER — SIMVASTATIN 20 MG PO TABS: 20.0000 mg | ORAL_TABLET | Freq: Every day | ORAL | Status: DC

## 2015-05-03 MED ORDER — HYDROCHLOROTHIAZIDE 25 MG PO TABS: 25.0000 mg | ORAL_TABLET | Freq: Every day | ORAL | Status: DC

## 2015-05-03 MED ORDER — DICLOFENAC SODIUM 75 MG PO TBEC: 75.0000 mg | DELAYED_RELEASE_TABLET | Freq: Two times a day (BID) | ORAL | Status: DC

## 2015-05-03 NOTE — Progress Notes (Signed)
Subjective:    Patient ID: Katelyn [name], female    DOB: [birth year], 69 y.o.   MRN: [id number]  HPI 09/24/14 Patient is a very pleasant 69 year old white female who is here today to establish care. Her only concern is her chronic neck pain. She's had 2 surgeries on her neck. The most recent surgery was performed in November 2015 by Dr. Patrice Paradise.  She is currently using Dilaudid 2 mg by mouth twice a day for pain. She is tried anti-inflammatories in the past, muscle relaxers, Cymbalta, Lyrica, and gabapentin without benefit. 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 It is multilevel degenerative disc disease with the most significant impingement at L4. She recently underwent epidural steroid injection at L4 with minimal benefit. She is scheduled to see her neurosurgeon next week to discuss her next option. Her past medical history is also significant for osteoporosis, hypertension, hyperlipidemia. Her most recent colonoscopy was in 2015. They found 4 polyps. She is due again in 2018. Her mammogram is not due until this summer. She is due for repeat bone density. Her last Pap smear was 2 years ago and is not due again until next year.  At that time, my plan was: Patient's physical exam today is normal. I will schedule the patient for a bone density test. I will also check a CBC, CMP, fasting lipid panel, TSH, and vitamin D level. Patient received Pneumovax 23 today. I will give her Prevnar 52 Year. Her shingles vaccine is up-to-date. We did discuss the tetanus shot but she declined it at the present time.  I will gladly refill her Dilaudid in the future if necessary depending upon what she decides with her orthopedic surgeon.  05/03/15 2 weeks ago, the patient underwent fusion of her cervical spine and her upper thoracic spine under the care of Dr. Patrice Paradise.  She is using Dilaudid 4 mg every 4 hours in addition to baclofen and receiving very little pain relief. However she is very hesitant to  take any stronger medication. She has edema in her neck which appears to be dependent edema. It is equal and symmetric bilaterally. There is no swelling of the jugular vein. There is no JVD. There is no swelling of the face. There is no evidence of superior vena cava syndrome. I believe this is dependent edema due to her inability to move her neck in all and also just healing from her recent surgery. She denies any chest pain shortness of breath or dyspnea on exertion. Her biggest concern is her significant pain Past Medical History  Diagnosis Date  . Hypertension   . Hyperlipidemia   . Osteoporosis   . Arthritis     cervical and lumbar DDD   Past Surgical History  Procedure Laterality Date  . Colonoscopy    . Polypectomy    . Upper gastrointestinal endoscopy    . Bunionectomy      right  . Cervical fusion      C5-6-7  . Spine surgery       Dr. Patrice Paradise (x2, last Nov/15)   Current Outpatient Prescriptions on File Prior to Visit  Medication Sig Dispense Refill  . alendronate (FOSAMAX) 70 MG tablet Take 1 tablet (70 mg total) by mouth every 7 (seven) days. Take with a full glass of water on an empty stomach. 12 tablet 3  . Ascorbic Acid (VITAMIN C) 500 MG tablet Take 500 mg by mouth daily.      Marland Kitchen aspirin 81 MG tablet Take  81 mg by mouth daily.      . B Complex Vitamins (VITAMIN B COMPLEX PO) Take 1 tablet by mouth daily.      . calcium carbonate (OS-CAL) 600 MG TABS Take 600 mg by mouth 2 (two) times daily with a meal.      . fish oil-omega-3 fatty acids 1000 MG capsule Take 1 g by mouth 2 (two) times daily.      Marland Kitchen gabapentin (NEURONTIN) 300 MG capsule Take 300 mg by mouth 2 (two) times daily.   1  . Garlic 123XX123 MG CAPS Take 1 capsule by mouth daily.      Marland Kitchen glucosamine-chondroitin 500-400 MG tablet Take 1 tablet by mouth daily.      . hydrochlorothiazide (HYDRODIURIL) 25 MG tablet Take 1 tablet (25 mg total) by mouth daily. 90 tablet 1  . HYDROmorphone (DILAUDID) 2 MG tablet Take 2 mg by  mouth 2 (two) times daily as needed.   0  . Lysine 500 MG CAPS Take 1 capsule by mouth daily.      . magnesium oxide (MAG-OX) 400 MG tablet Take 400 mg by mouth daily.      . Multiple Vitamins-Minerals (MULTIVITAMIN WITH MINERALS) tablet Take 1 tablet by mouth daily.      Marland Kitchen POTASSIUM GLUCONATE PO Take 1 tablet by mouth daily.      . simvastatin (ZOCOR) 20 MG tablet Take 1 tablet (20 mg total) by mouth at bedtime. 90 tablet 1  . SUMAtriptan (IMITREX) 100 MG tablet Take 0.5 tablets (50 mg total) by mouth every 2 (two) hours as needed for migraine or headache. 10 tablet 5  . vitamin E 400 UNIT capsule Take 400 Units by mouth daily.      Marland Kitchen zolpidem (AMBIEN) 10 MG tablet Take 1 tablet (10 mg total) by mouth at bedtime as needed for sleep. 30 tablet 0   No current facility-administered medications on file prior to visit.   Allergies  Allergen Reactions  . Prednisone Hives  . Hydrocodone Palpitations  . Red Dye Rash   Social History   Social History  . Marital Status: Married    Spouse Name: N/A  . Number of Children: N/A  . Years of Education: N/A   Occupational History  . Not on file.   Social History Main Topics  . Smoking status: Never Smoker   . Smokeless tobacco: Never Used  . Alcohol Use: 4.2 oz/week    7 Glasses of wine per week  . Drug Use: No  . Sexual Activity: Yes   Other Topics Concern  . Not on file   Social History Narrative   Family History  Problem Relation Age of Onset  . Colon polyps Mother   . Arthritis Mother   . Hearing loss Mother   . Hyperlipidemia Mother   . Hypertension Mother   . Stroke Mother   . Colon polyps Father   . Arthritis Father   . Heart disease Father   . Hyperlipidemia Father   . Colon cancer Neg Hx   . Pancreatic cancer Neg Hx   . Stomach cancer Neg Hx       Review of Systems  All other systems reviewed and are negative.      Objective:   Physical Exam  Constitutional: She is oriented to person, place, and time. She  appears well-developed and well-nourished. No distress.  HENT:  Head: Normocephalic.  Neck: No JVD present.  Cardiovascular: Normal rate, regular rhythm and intact distal pulses.  Exam reveals no gallop and no friction rub.   No murmur heard. Pulmonary/Chest: Effort normal and breath sounds normal. No stridor. No respiratory distress. She has no wheezes. She has no rales. She exhibits no tenderness.  Abdominal: Soft. Bowel sounds are normal. She exhibits no distension and no mass. There is no tenderness. There is no rebound and no guarding.  Musculoskeletal: She exhibits no edema.       Cervical back: She exhibits decreased range of motion, tenderness, bony tenderness, swelling, pain and spasm.  Neurological: She is alert and oriented to person, place, and time. She has normal reflexes. No cranial nerve deficit. She exhibits normal muscle tone. Coordination normal.  Skin: She is not diaphoretic.  Vitals reviewed.         Assessment & Plan:  Hospital discharge follow-up - Plan: CBC with Differential/Platelet, COMPLETE METABOLIC PANEL WITH GFR  Continue Dilaudid in addition to baclofen. She would like adding a standing dose anti-inflammatory such as diclofenac 75 mg by mouth twice a day. We'll also try warm compresses to relieve the muscle spasms. However if in one week, the patient experiences no benefit at all, I would be willing to start the patient on a fentanyl patch. I will start the patient a 25 g per 72 hours and increase as needed to provide pain relief. She will call us back next week and let us know how the medication is helping with her pain. I will monitor her kidney function as well as her liver function tests prior to making any changes in her pain medication

## 2015-05-04 LAB — CBC WITH DIFFERENTIAL/PLATELET
BASOS ABS: 0.1 10*3/uL (ref 0.0–0.1)
Basophils Relative: 1 % (ref 0–1)
Eosinophils Absolute: 0.1 10*3/uL (ref 0.0–0.7)
Eosinophils Relative: 2 % (ref 0–5)
HEMATOCRIT: 34.5 % — AB (ref 36.0–46.0)
Hemoglobin: 11.8 g/dL — ABNORMAL LOW (ref 12.0–15.0)
LYMPHS ABS: 1.4 10*3/uL (ref 0.7–4.0)
LYMPHS PCT: 22 % (ref 12–46)
MCH: 31.1 pg (ref 26.0–34.0)
MCHC: 34.2 g/dL (ref 30.0–36.0)
MCV: 90.8 fL (ref 78.0–100.0)
MPV: 9.2 fL (ref 8.6–12.4)
Monocytes Absolute: 0.8 10*3/uL (ref 0.1–1.0)
Monocytes Relative: 12 % (ref 3–12)
NEUTROS PCT: 63 % (ref 43–77)
Neutro Abs: 4 10*3/uL (ref 1.7–7.7)
Platelets: 454 10*3/uL — ABNORMAL HIGH (ref 150–400)
RBC: 3.8 MIL/uL — ABNORMAL LOW (ref 3.87–5.11)
RDW: 13.1 % (ref 11.5–15.5)
WBC: 6.4 10*3/uL (ref 4.0–10.5)

## 2015-05-04 LAB — COMPLETE METABOLIC PANEL WITH GFR
ALBUMIN: 3.7 g/dL (ref 3.6–5.1)
ALT: 32 U/L — AB (ref 6–29)
AST: 24 U/L (ref 10–35)
Alkaline Phosphatase: 88 U/L (ref 33–130)
BILIRUBIN TOTAL: 0.4 mg/dL (ref 0.2–1.2)
BUN: 7 mg/dL (ref 7–25)
CALCIUM: 10.1 mg/dL (ref 8.6–10.4)
CO2: 30 mmol/L (ref 20–31)
CREATININE: 0.56 mg/dL (ref 0.50–0.99)
Chloride: 98 mmol/L (ref 98–110)
GFR, Est Non African American: >89 mL/min (ref >=60)
Glucose, Bld: 96 mg/dL (ref 70–99)
Potassium: 4.1 mmol/L (ref 3.5–5.3)
Sodium: 139 mmol/L (ref 135–146)
TOTAL PROTEIN: 6.2 g/dL (ref 6.1–8.1)

## 2015-05-05 DIAGNOSIS — M40209 Unspecified kyphosis, site unspecified: Secondary | ICD-10-CM | POA: Diagnosis not present

## 2015-05-05 DIAGNOSIS — M4800 Spinal stenosis, site unspecified: Secondary | ICD-10-CM | POA: Diagnosis not present

## 2015-05-05 DIAGNOSIS — M961 Postlaminectomy syndrome, not elsewhere classified: Secondary | ICD-10-CM | POA: Diagnosis not present

## 2015-05-05 DIAGNOSIS — Z4889 Encounter for other specified surgical aftercare: Secondary | ICD-10-CM | POA: Diagnosis not present

## 2015-05-05 DIAGNOSIS — G8929 Other chronic pain: Secondary | ICD-10-CM | POA: Diagnosis not present

## 2015-05-05 DIAGNOSIS — M5134 Other intervertebral disc degeneration, thoracic region: Secondary | ICD-10-CM | POA: Diagnosis not present

## 2015-05-07 DIAGNOSIS — M4010 Other secondary kyphosis, site unspecified: Secondary | ICD-10-CM | POA: Diagnosis not present

## 2015-05-07 DIAGNOSIS — M791 Myalgia: Secondary | ICD-10-CM | POA: Diagnosis not present

## 2015-05-07 DIAGNOSIS — M542 Cervicalgia: Secondary | ICD-10-CM | POA: Diagnosis not present

## 2015-05-09 DIAGNOSIS — M4800 Spinal stenosis, site unspecified: Secondary | ICD-10-CM | POA: Diagnosis not present

## 2015-05-09 DIAGNOSIS — M40209 Unspecified kyphosis, site unspecified: Secondary | ICD-10-CM | POA: Diagnosis not present

## 2015-05-09 DIAGNOSIS — Z4889 Encounter for other specified surgical aftercare: Secondary | ICD-10-CM | POA: Diagnosis not present

## 2015-05-09 DIAGNOSIS — G8929 Other chronic pain: Secondary | ICD-10-CM | POA: Diagnosis not present

## 2015-05-09 DIAGNOSIS — M5134 Other intervertebral disc degeneration, thoracic region: Secondary | ICD-10-CM | POA: Diagnosis not present

## 2015-05-09 DIAGNOSIS — M961 Postlaminectomy syndrome, not elsewhere classified: Secondary | ICD-10-CM | POA: Diagnosis not present

## 2015-05-14 DIAGNOSIS — M542 Cervicalgia: Secondary | ICD-10-CM | POA: Diagnosis not present

## 2015-05-15 DIAGNOSIS — M961 Postlaminectomy syndrome, not elsewhere classified: Secondary | ICD-10-CM | POA: Diagnosis not present

## 2015-05-15 DIAGNOSIS — G8929 Other chronic pain: Secondary | ICD-10-CM | POA: Diagnosis not present

## 2015-05-15 DIAGNOSIS — M40209 Unspecified kyphosis, site unspecified: Secondary | ICD-10-CM | POA: Diagnosis not present

## 2015-05-15 DIAGNOSIS — M4800 Spinal stenosis, site unspecified: Secondary | ICD-10-CM | POA: Diagnosis not present

## 2015-05-15 DIAGNOSIS — M5134 Other intervertebral disc degeneration, thoracic region: Secondary | ICD-10-CM | POA: Diagnosis not present

## 2015-05-15 DIAGNOSIS — Z4889 Encounter for other specified surgical aftercare: Secondary | ICD-10-CM | POA: Diagnosis not present

## 2015-05-19 DIAGNOSIS — M961 Postlaminectomy syndrome, not elsewhere classified: Secondary | ICD-10-CM | POA: Diagnosis not present

## 2015-05-19 DIAGNOSIS — Z4889 Encounter for other specified surgical aftercare: Secondary | ICD-10-CM | POA: Diagnosis not present

## 2015-05-19 DIAGNOSIS — M5134 Other intervertebral disc degeneration, thoracic region: Secondary | ICD-10-CM | POA: Diagnosis not present

## 2015-05-19 DIAGNOSIS — G8929 Other chronic pain: Secondary | ICD-10-CM | POA: Diagnosis not present

## 2015-05-19 DIAGNOSIS — M4800 Spinal stenosis, site unspecified: Secondary | ICD-10-CM | POA: Diagnosis not present

## 2015-05-19 DIAGNOSIS — M40209 Unspecified kyphosis, site unspecified: Secondary | ICD-10-CM | POA: Diagnosis not present

## 2015-05-20 ENCOUNTER — Encounter: Payer: Self-pay | Admitting: Family Medicine

## 2015-05-20 ENCOUNTER — Ambulatory Visit (INDEPENDENT_AMBULATORY_CARE_PROVIDER_SITE_OTHER): Payer: Medicare Other | Admitting: Family Medicine

## 2015-05-20 VITALS — BP 118/68 | HR 70 | Temp 98.4°F | Resp 14 | Ht 63.0 in | Wt 103.0 lb

## 2015-05-20 DIAGNOSIS — F4323 Adjustment disorder with mixed anxiety and depressed mood: Secondary | ICD-10-CM

## 2015-05-20 MED ORDER — ESCITALOPRAM OXALATE 5 MG PO TABS: 5.0000 mg | ORAL_TABLET | Freq: Every day | ORAL | Status: DC

## 2015-05-20 MED ORDER — TRAMADOL HCL 50 MG PO TABS: 50.0000 mg | ORAL_TABLET | Freq: Three times a day (TID) | ORAL | Status: DC | PRN

## 2015-05-20 MED ORDER — ALPRAZOLAM 0.5 MG PO TABS: 0.5000 mg | ORAL_TABLET | Freq: Two times a day (BID) | ORAL | Status: DC | PRN

## 2015-05-20 NOTE — Patient Instructions (Signed)
Take the lexapro 5mg  at bedtime  Use the xanax twice a day as needed F/U 2 weeks with Dr. Buelah Manis

## 2015-05-20 NOTE — Progress Notes (Signed)
Patient ID: Katelyn [name], female   DOB: 1945-12-25, 69 y.o.   MRN: [id number]   Subjective:    Patient ID: Katelyn [name], female    DOB: [birth year], 69 y.o.   MRN: [id number]  Patient presents for Anxiety  patient here to discuss anxiety medication. Since she had her surgery which is then about 6-8 weeks ago she's had severe anxiety. She had difficulty with high-dose narcotic medications. I reviewed her primary care provider's last note. She That up with being on the high-dose medication of Dilantin and fentanyl and she actually stopped herself 3 much cold [place] off the medications. She had cold chills nausea she was very anxious and fidgety interest did not feel well this weekend. Now she has more significant anxiety. She finds herself tearful at the [place] whim thinking about all that she has been through with her surgery. Evidently she was supposed to have a 2 hour surgery to fix one disc and she ended up having multiple fusions done which she was not prepared 4. She has history of depression and anxiety in the past she was treated with Paxil. Her husband has Xanax at home she did take some of this and it helped for the weekend however she still had difficulty sleeping. She has a nurse that comes to the house every day to check on her.    Review Of Systems:  GEN- denies fatigue, fever, weight loss,weakness, recent illness HEENT- denies eye drainage, change in vision, nasal discharge, CVS- denies chest pain, palpitations RESP- denies SOB, cough, wheeze ABD- denies N/V, change in stools, abd pain MSK- +joint pain  Neuro- denies headache, dizziness, syncope, seizure activity       Objective:    BP 118/68 mmHg  Pulse 70  Temp(Src) 98.4 F (36.9 C) (Oral)  Resp 14  Ht 5\' 3"  (1.6 m)  Wt 103 lb (46.72 kg)  BMI 18.25 kg/m2 GEN- NAD, alert and oriented x3 Psych- anxious and tearful, normal speech, well groomed, very pleasant, no SI/HI       Assessment & Plan:      Problem  List Items Addressed This Visit    None    Visit Diagnoses    Situational mixed anxiety and depressive disorder    -  Primary    Tried to give reassurance, a lot of symptoms were from narcotics. Now with high anxiety and depressed mood. Wll give trial of lexapro 5mg , will give xanax 0.5mg  to bridge her. F/U in 2 weeks        Note: This dictation was prepared with Dragon dictation along with smaller phrase technology. Any transcriptional errors that result from this process are unintentional.

## 2015-05-22 ENCOUNTER — Telehealth: Payer: Self-pay | Admitting: Family Medicine

## 2015-05-22 DIAGNOSIS — M961 Postlaminectomy syndrome, not elsewhere classified: Secondary | ICD-10-CM | POA: Diagnosis not present

## 2015-05-22 DIAGNOSIS — M4800 Spinal stenosis, site unspecified: Secondary | ICD-10-CM | POA: Diagnosis not present

## 2015-05-22 DIAGNOSIS — Z4889 Encounter for other specified surgical aftercare: Secondary | ICD-10-CM | POA: Diagnosis not present

## 2015-05-22 DIAGNOSIS — M5134 Other intervertebral disc degeneration, thoracic region: Secondary | ICD-10-CM | POA: Diagnosis not present

## 2015-05-22 DIAGNOSIS — G8929 Other chronic pain: Secondary | ICD-10-CM | POA: Diagnosis not present

## 2015-05-22 DIAGNOSIS — M40209 Unspecified kyphosis, site unspecified: Secondary | ICD-10-CM | POA: Diagnosis not present

## 2015-05-22 NOTE — Telephone Encounter (Signed)
Called and spoke to pt and she is feeling much better this afternoon and the nurse came out today and think maybe she is have some withdrawal symptoms from all the pain meds she has been on with her surgery so she is going to soak in a warm bath with epson salts and if she gets worse or no better she will call to set up an appointment.

## 2015-05-22 NOTE — Telephone Encounter (Signed)
Patient is calling to speak with you regarding some aching in her legs she is having  [phone]

## 2015-05-29 ENCOUNTER — Telehealth: Payer: Self-pay | Admitting: *Deleted

## 2015-05-29 DIAGNOSIS — G8929 Other chronic pain: Secondary | ICD-10-CM | POA: Diagnosis not present

## 2015-05-29 DIAGNOSIS — M5134 Other intervertebral disc degeneration, thoracic region: Secondary | ICD-10-CM | POA: Diagnosis not present

## 2015-05-29 DIAGNOSIS — M961 Postlaminectomy syndrome, not elsewhere classified: Secondary | ICD-10-CM | POA: Diagnosis not present

## 2015-05-29 DIAGNOSIS — Z4889 Encounter for other specified surgical aftercare: Secondary | ICD-10-CM | POA: Diagnosis not present

## 2015-05-29 DIAGNOSIS — M40209 Unspecified kyphosis, site unspecified: Secondary | ICD-10-CM | POA: Diagnosis not present

## 2015-05-29 DIAGNOSIS — M4800 Spinal stenosis, site unspecified: Secondary | ICD-10-CM | POA: Diagnosis not present

## 2015-05-29 NOTE — Telephone Encounter (Signed)
Okay to take the ibuprofen, would not recommend any other medications at this time

## 2015-05-29 NOTE — Telephone Encounter (Signed)
Received call from [facility] RN.  Reports that patient has aching in BLE. States that she thinks is may be neuropathy.   Requested MD to advise on medication. IBU has interaction with anxiety medication.     MD please advise.

## 2015-05-29 NOTE — Telephone Encounter (Signed)
Call placed to patient and patient made aware.   Patient states that achiness is not as bad as HH RN made it sound, and she will take a "wait and see" approach.

## 2015-06-03 ENCOUNTER — Ambulatory Visit (INDEPENDENT_AMBULATORY_CARE_PROVIDER_SITE_OTHER): Payer: Medicare Other | Admitting: Family Medicine

## 2015-06-03 ENCOUNTER — Encounter: Payer: Self-pay | Admitting: Family Medicine

## 2015-06-03 VITALS — BP 112/72 | HR 68 | Temp 97.9°F | Resp 16 | Ht 63.0 in | Wt 102.0 lb

## 2015-06-03 DIAGNOSIS — M791 Myalgia, unspecified site: Secondary | ICD-10-CM

## 2015-06-03 DIAGNOSIS — F4323 Adjustment disorder with mixed anxiety and depressed mood: Secondary | ICD-10-CM

## 2015-06-03 NOTE — Progress Notes (Signed)
Patient ID: Katelyn [name], female   DOB: 1945/11/24, 69 y.o.   MRN: [id number]   Subjective:    Patient ID: Katelyn [name], female    DOB: 07-Jun-1946, 69 y.o.   MRN: [id number]  Patient presents for F/U Anxiety and Muscle Aches  Patient here to follow-up situational inside a depression. She was started on Lexapro 5 mg along with Xanax bridge at our visit 2 weeks ago. She's had a lot of difficulty because she was on high-dose narcotic medications and she had withdrawal problems from this. She had neck surgery which she's had difficulty healing from. He was not sleeping well and she felt anxious all the time. She is here today for follow-up on medication. She never started the Lexapro. She'll want to start one thing at a time. She's been taking Xanax twice a day until the past couple days when she decreased herself down to one tablet which she would split throughout the day. She states that her nerves are better when she does get some restlessness in her legs at night.. She thinks this may be from coming off of the narcotic medication. She not have any other medication changes. I reviewed her meds in detail she has been on the cholesterol meds for quite some time she is on magnesium. She's no longer on gabapentin she is not taking Fosamax. Of note she is also not on anti-inflammatory she stopped this about a week ago as well.   Review Of Systems:  GEN- denies fatigue, fever, weight loss,weakness, recent illness HEENT- denies eye drainage, change in vision, nasal discharge, CVS- denies chest pain, palpitations RESP- denies SOB, cough, wheeze MSK- + joint pain, muscle aches, injury Neuro- denies headache, dizziness, syncope, seizure activity       Objective:    BP 112/72 mmHg  Pulse 68  Temp(Src) 97.9 F (36.6 C) (Oral)  Resp 16  Ht 5\' 3"  (1.6 m)  Wt 102 lb (46.267 kg)  BMI 18.07 kg/m2 GEN- NAD, alert and oriented x3 CVS- RRR,no murmur RESP-CTAB Neck- no spasm,improved ROM MSK- Lower  ext- NT, good ROM hips/knees/ankles, no effusion Neuro- CNII-XII intact, normal tone LE Psych- Not depressed appearing, very pleasant, smiling        Assessment & Plan:        Problem List Items Addressed This Visit    None    Visit Diagnoses    Situational mixed anxiety and depressive disorder    -  Primary    Improved mood today, will continue the xanax, she is down to 1 tab a day, will take 1/2 of that at bedtime to help with insomnia and relaxation. For now hold on the lexapro    Myalgia        Mild myalgia worse at night, I think this is due to coming off of many medications and  She seems restless at night, I think it is in her best interest not to add anything else to her regimen and let the dust settle on the events which have happened recently and her weaning off the high dose narcotics, she is in agreement with this.       Note: This dictation was prepared with Dragon dictation along with smaller phrase technology. Any transcriptional errors that result from this process are unintentional.

## 2015-06-03 NOTE — Patient Instructions (Signed)
Check the Vitamin E dose, 400mg  a day is good Take 1/2 tablet of Xanax at bedtime and you can use the other half during the day  F/U as needed

## 2015-06-06 DIAGNOSIS — G8929 Other chronic pain: Secondary | ICD-10-CM | POA: Diagnosis not present

## 2015-06-06 DIAGNOSIS — M961 Postlaminectomy syndrome, not elsewhere classified: Secondary | ICD-10-CM | POA: Diagnosis not present

## 2015-06-06 DIAGNOSIS — M4800 Spinal stenosis, site unspecified: Secondary | ICD-10-CM | POA: Diagnosis not present

## 2015-06-06 DIAGNOSIS — M5134 Other intervertebral disc degeneration, thoracic region: Secondary | ICD-10-CM | POA: Diagnosis not present

## 2015-06-06 DIAGNOSIS — M40209 Unspecified kyphosis, site unspecified: Secondary | ICD-10-CM | POA: Diagnosis not present

## 2015-06-06 DIAGNOSIS — Z4889 Encounter for other specified surgical aftercare: Secondary | ICD-10-CM | POA: Diagnosis not present

## 2015-06-11 DIAGNOSIS — R54 Age-related physical debility: Secondary | ICD-10-CM | POA: Diagnosis not present

## 2015-06-12 DIAGNOSIS — M961 Postlaminectomy syndrome, not elsewhere classified: Secondary | ICD-10-CM | POA: Diagnosis not present

## 2015-06-12 DIAGNOSIS — Z4889 Encounter for other specified surgical aftercare: Secondary | ICD-10-CM | POA: Diagnosis not present

## 2015-06-12 DIAGNOSIS — M40209 Unspecified kyphosis, site unspecified: Secondary | ICD-10-CM | POA: Diagnosis not present

## 2015-06-12 DIAGNOSIS — G8929 Other chronic pain: Secondary | ICD-10-CM | POA: Diagnosis not present

## 2015-06-12 DIAGNOSIS — M5134 Other intervertebral disc degeneration, thoracic region: Secondary | ICD-10-CM | POA: Diagnosis not present

## 2015-06-12 DIAGNOSIS — M4800 Spinal stenosis, site unspecified: Secondary | ICD-10-CM | POA: Diagnosis not present

## 2015-06-19 DIAGNOSIS — M4800 Spinal stenosis, site unspecified: Secondary | ICD-10-CM | POA: Diagnosis not present

## 2015-06-19 DIAGNOSIS — M40209 Unspecified kyphosis, site unspecified: Secondary | ICD-10-CM | POA: Diagnosis not present

## 2015-06-19 DIAGNOSIS — G8929 Other chronic pain: Secondary | ICD-10-CM | POA: Diagnosis not present

## 2015-06-19 DIAGNOSIS — M5134 Other intervertebral disc degeneration, thoracic region: Secondary | ICD-10-CM | POA: Diagnosis not present

## 2015-06-19 DIAGNOSIS — Z4889 Encounter for other specified surgical aftercare: Secondary | ICD-10-CM | POA: Diagnosis not present

## 2015-06-19 DIAGNOSIS — M961 Postlaminectomy syndrome, not elsewhere classified: Secondary | ICD-10-CM | POA: Diagnosis not present

## 2015-06-25 DIAGNOSIS — G8929 Other chronic pain: Secondary | ICD-10-CM | POA: Diagnosis not present

## 2015-06-25 DIAGNOSIS — M4800 Spinal stenosis, site unspecified: Secondary | ICD-10-CM | POA: Diagnosis not present

## 2015-06-25 DIAGNOSIS — M5134 Other intervertebral disc degeneration, thoracic region: Secondary | ICD-10-CM | POA: Diagnosis not present

## 2015-06-25 DIAGNOSIS — M961 Postlaminectomy syndrome, not elsewhere classified: Secondary | ICD-10-CM | POA: Diagnosis not present

## 2015-06-25 DIAGNOSIS — Z4889 Encounter for other specified surgical aftercare: Secondary | ICD-10-CM | POA: Diagnosis not present

## 2015-06-25 DIAGNOSIS — M40209 Unspecified kyphosis, site unspecified: Secondary | ICD-10-CM | POA: Diagnosis not present

## 2015-06-27 DIAGNOSIS — M4322 Fusion of spine, cervical region: Secondary | ICD-10-CM | POA: Diagnosis not present

## 2015-07-10 ENCOUNTER — Telehealth: Payer: Self-pay | Admitting: Family Medicine

## 2015-07-10 MED ORDER — DICLOFENAC SODIUM 75 MG PO TBEC: 75.0000 mg | DELAYED_RELEASE_TABLET | Freq: Two times a day (BID) | ORAL | Status: DC

## 2015-07-10 NOTE — Telephone Encounter (Signed)
Pt called c/o pain from her back surgery. She does not want to take narcotics and states that the Diclofenac Sodium 75 mg helped her very much and would like to know if Dr. Dennard Schaumann could call her in another prescription. She uses the [facility] in [place].  You can reach her @ [phone]

## 2015-07-10 NOTE — Telephone Encounter (Signed)
Medication called/sent to requested pharmacy - pt aware 

## 2015-07-15 DIAGNOSIS — M791 Myalgia: Secondary | ICD-10-CM | POA: Diagnosis not present

## 2015-08-21 DIAGNOSIS — M791 Myalgia: Secondary | ICD-10-CM | POA: Diagnosis not present

## 2015-08-21 DIAGNOSIS — M4322 Fusion of spine, cervical region: Secondary | ICD-10-CM | POA: Diagnosis not present

## 2015-08-21 DIAGNOSIS — M542 Cervicalgia: Secondary | ICD-10-CM | POA: Diagnosis not present

## 2015-09-11 ENCOUNTER — Ambulatory Visit (INDEPENDENT_AMBULATORY_CARE_PROVIDER_SITE_OTHER): Payer: Medicare Other | Admitting: Family Medicine

## 2015-09-11 ENCOUNTER — Encounter: Payer: Self-pay | Admitting: Family Medicine

## 2015-09-11 VITALS — BP 160/96 | HR 84 | Temp 98.1°F | Resp 16 | Ht 63.0 in | Wt 105.0 lb

## 2015-09-11 DIAGNOSIS — F411 Generalized anxiety disorder: Secondary | ICD-10-CM | POA: Diagnosis not present

## 2015-09-11 DIAGNOSIS — I493 Ventricular premature depolarization: Secondary | ICD-10-CM | POA: Diagnosis not present

## 2015-09-11 DIAGNOSIS — I10 Essential (primary) hypertension: Secondary | ICD-10-CM

## 2015-09-11 NOTE — Assessment & Plan Note (Addendum)
Wosrening BP,in setting of recent steroids but also significant anxiety. I will check her last make sure she does not have any glucose or renal problems. I think the more she took her blood pressure at home her anxiety causing her to increase even further. Will put her on by systolic 5 mg today and she will follow-up on Monday for recheck. He'll continue the hydrochlorothiazide. I also recommend that she take the Xanax to see if this will help calm her nerves to help her rest.

## 2015-09-11 NOTE — Progress Notes (Signed)
Patient ID: Katelyn [name], female   DOB: [birth year], 70 y.o.   MRN: [id number]    Subjective:    Patient ID: Katelyn [name], female    DOB: [birth year], 70 y.o.   MRN: [id number]  Patient presents for High Blood Pressure and Jitteriness Patient here with episodes of jitteriness and her blood pressure running up. The only thing that has changed the past couple months is that she has had some trigger point injections performed by her surgeon Dr. Claiborne Billings. These were done with steroid. She had trigger point injections done on January 21 after that she began having this caffeine high like feeling where she felt very jittery and on age her blood pressure was also running up into the 150s over 90s she's had difficulty getting it back down. She typically takes hydrochlorothiazide 25 mg once a day and this controls her blood pressure very well. She had another set of injections done on March 4 and she had the same experience and her blood pressures remained high since then. Of course when she checks it at home it continues to go up and she comes very anxious and has found the numbers going higher and higher. She denies any chest pain no shortness of breath just feels kind jittery and on edge. She is not taking any of the depression or anxiety medications. In the past that prescribed Lexapro but she never started that and she's not had seen next since December. She does take Ambien only when she goes on trips that she took some about a week ago but since then has not been on this medication. The only medication she is using for her chronic pain is topical icy hot and Tylenol    Review Of Systems: per above   GEN- denies fatigue, fever, weight loss,weakness, recent illness HEENT- denies eye drainage, change in vision, nasal discharge, CVS- denies chest pain, palpitations RESP- denies SOB, cough, wheeze ABD- denies N/V, change in stools, abd pain GU- denies dysuria, hematuria, dribbling, incontinence MSK- denies  joint pain, muscle aches, injury Neuro- denies headache, dizziness, syncope, seizure activity       Objective:    BP 160/96 mmHg  Pulse 84  Temp(Src) 98.1 F (36.7 C) (Oral)  Resp 16  Ht 5\' 3"  (1.6 m)  Wt 105 lb (47.628 kg)  BMI 18.60 kg/m2 GEN- NAD, alert and oriented x3 HEENT- PERRL, EOMI, non injected sclera, pink conjunctiva, MMM, oropharynx clear Neck- Supple, no thyromegaly CVS- RRR, PVC, no murmur RESP-CTAB ABD-NABS,soft,NT,ND Neuro-CNII-XII intact, no deficits, no tremor Psych- anxious appearing, not depressed, good eye contact  EXT- No edema Pulses- Radial  2+   EKG- nsr, few PVC     Assessment & Plan:      Problem List Items Addressed This Visit    HTN (hypertension) - Primary    Wosrening BP,in setting of recent steroids but also significant anxiety. I will check her last make sure she does not have any glucose or renal problems. I think the more she took her blood pressure at home her anxiety causing her to increase even further. Will put her on by systolic 5 mg today and she will follow-up on Monday for recheck. He'll continue the hydrochlorothiazide. I also recommend that she take the Xanax to see if this will help calm her nerves to help her rest.      Relevant Orders   CBC with Differential/Platelet   Comprehensive metabolic panel   TSH   EKG 12-Lead (Completed)   GAD (generalized  anxiety disorder)    She is chronic pain issues and now she's been monitoring her blood pressure and this has been elevating causing more anxiety. She has alprazolam at home she can use this as needed. I did prescribe Lexapro in the past but she did not want to take this.       Other Visit Diagnoses    PVC (premature ventricular contraction)        Few ectopic beats, pt was not aware,        Note: This dictation was prepared with Dragon dictation along with smaller phrase technology. Any transcriptional errors that result from this process are unintentional.

## 2015-09-11 NOTE — Patient Instructions (Signed)
F/U Monday for recheck  

## 2015-09-11 NOTE — Assessment & Plan Note (Signed)
She is chronic pain issues and now she's been monitoring her blood pressure and this has been elevating causing more anxiety. She has alprazolam at home she can use this as needed. I did prescribe Lexapro in the past but she did not want to take this.

## 2015-09-12 LAB — COMPREHENSIVE METABOLIC PANEL
ALT: 31 U/L — ABNORMAL HIGH (ref 6–29)
AST: 25 U/L (ref 10–35)
Albumin: 4.3 g/dL (ref 3.6–5.1)
Alkaline Phosphatase: 60 U/L (ref 33–130)
BUN: 10 mg/dL (ref 7–25)
CHLORIDE: 99 mmol/L (ref 98–110)
CO2: 30 mmol/L (ref 20–31)
Calcium: 10.2 mg/dL (ref 8.6–10.4)
Creat: 0.65 mg/dL (ref 0.50–0.99)
GLUCOSE: 95 mg/dL (ref 70–99)
POTASSIUM: 4.1 mmol/L (ref 3.5–5.3)
Sodium: 139 mmol/L (ref 135–146)
Total Bilirubin: 0.5 mg/dL (ref 0.2–1.2)
Total Protein: 6.8 g/dL (ref 6.1–8.1)

## 2015-09-12 LAB — CBC WITH DIFFERENTIAL/PLATELET
Basophils Absolute: 0 10*3/uL (ref 0.0–0.1)
Basophils Relative: 0 % (ref 0–1)
EOS ABS: 0.1 10*3/uL (ref 0.0–0.7)
EOS PCT: 1 % (ref 0–5)
HCT: 41.9 % (ref 36.0–46.0)
Hemoglobin: 14 g/dL (ref 12.0–15.0)
LYMPHS ABS: 1.7 10*3/uL (ref 0.7–4.0)
Lymphocytes Relative: 26 % (ref 12–46)
MCH: 29.9 pg (ref 26.0–34.0)
MCHC: 33.4 g/dL (ref 30.0–36.0)
MCV: 89.3 fL (ref 78.0–100.0)
MONO ABS: 0.5 10*3/uL (ref 0.1–1.0)
MONOS PCT: 8 % (ref 3–12)
MPV: 9.7 fL (ref 8.6–12.4)
Neutro Abs: 4.3 10*3/uL (ref 1.7–7.7)
Neutrophils Relative %: 65 % (ref 43–77)
PLATELETS: 283 10*3/uL (ref 150–400)
RBC: 4.69 MIL/uL (ref 3.87–5.11)
RDW: 15.6 % — AB (ref 11.5–15.5)
WBC: 6.6 10*3/uL (ref 4.0–10.5)

## 2015-09-12 LAB — TSH: TSH: 0.73 mIU/L

## 2015-09-16 ENCOUNTER — Ambulatory Visit (INDEPENDENT_AMBULATORY_CARE_PROVIDER_SITE_OTHER): Payer: Medicare Other | Admitting: Family Medicine

## 2015-09-16 ENCOUNTER — Other Ambulatory Visit: Payer: Self-pay | Admitting: Family Medicine

## 2015-09-16 ENCOUNTER — Encounter: Payer: Self-pay | Admitting: Family Medicine

## 2015-09-16 VITALS — BP 130/82 | HR 72 | Temp 97.8°F | Resp 18 | Ht 63.0 in | Wt 104.0 lb

## 2015-09-16 DIAGNOSIS — I10 Essential (primary) hypertension: Secondary | ICD-10-CM

## 2015-09-16 DIAGNOSIS — F411 Generalized anxiety disorder: Secondary | ICD-10-CM

## 2015-09-16 DIAGNOSIS — G894 Chronic pain syndrome: Secondary | ICD-10-CM

## 2015-09-16 DIAGNOSIS — M1990 Unspecified osteoarthritis, unspecified site: Secondary | ICD-10-CM

## 2015-09-16 MED ORDER — CELECOXIB 100 MG PO CAPS: 100.0000 mg | ORAL_CAPSULE | Freq: Every day | ORAL | Status: DC | PRN

## 2015-09-16 MED ORDER — ESCITALOPRAM OXALATE 5 MG PO TABS: 5.0000 mg | ORAL_TABLET | Freq: Every day | ORAL | Status: DC

## 2015-09-16 NOTE — Assessment & Plan Note (Signed)
Lexapro 5 mg Will Bridges with her current alprazolam dose. She is supposed to be seen her primary care provider in a few weeks for physical exam her medication can be followed up at that time

## 2015-09-16 NOTE — Assessment & Plan Note (Addendum)
Trial of Celebrex 100 mg a day if needed.  approx 15 minutes spent with pt > 50% spent on counseling and medications

## 2015-09-16 NOTE — Patient Instructions (Addendum)
Start lexapro at bedtime  Use the xanax only if needed for stress Stop bystolic  Your labs look good Celebex for pain  Release of records- Dr. Rennis Harding- last 2 office visit notes  F/U as previous

## 2015-09-16 NOTE — Telephone Encounter (Signed)
Refill appropriate and filled per protocol. 

## 2015-09-16 NOTE — Assessment & Plan Note (Addendum)
Her blood pressure is much improved she will discontinue her current medications. We will stop the bystolic

## 2015-09-16 NOTE — Progress Notes (Signed)
Patient ID: Katelyn [name], female   DOB: 02/28/1946, 70 y.o.   MRN: [id number]    Subjective:    Patient ID: Katelyn [name], female    DOB: 05-15-1946, 70 y.o.   MRN: [id number]  Patient presents for F/U Patient here for interim follow-up. She was seen last week with hypertension and anxiety. She took the by salt for a few days and her blood pressure return on average 120 to 1:30 over 70s. She also takes Xanax and that time and felt much calmer. She stopped taking otherwise diastolic after about 4 days and continue with the Xanax to half a tablet typically once a day and her blood pressures remain low. Today she is ready to start Lexapro she does that her anxiety is uncontrolled and contributing to her elevated blood pressures. Note her labs were normal.  She's also asking for something nonnarcotic to help with her pain. She does take ibuprofen and Aleve but this has not helped very much. She cannot afford Lidoderm patches    Review Of Systems:  GEN- denies fatigue, fever, weight loss,weakness, recent illness HEENT- denies eye drainage, change in vision, nasal discharge, CVS- denies chest pain, palpitations RESP- denies SOB, cough, wheeze ABD- denies N/V, change in stools, abd pain GU- denies dysuria, hematuria, dribbling, incontinence MSK- denies joint pain, muscle aches, injury Neuro- denies headache, dizziness, syncope, seizure activity       Objective:    BP 130/82 mmHg  Pulse 72  Temp(Src) 97.8 F (36.6 C) (Oral)  Resp 18  Ht 5\' 3"  (1.6 m)  Wt 104 lb (47.174 kg)  BMI 18.43 kg/m2 GEN- NAD, alert and oriented x3 Psych- smiling and cheerful today, no SI, well groomed, mild anxiety       Assessment & Plan:      Problem List Items Addressed This Visit    HTN (hypertension)    Her blood pressure is much improved she will discontinue her current medications. We will stop the bystolic      GAD (generalized anxiety disorder) - Primary    Lexapro 5 mg Will Bridges with  her current alprazolam dose. She is supposed to be seen her primary care provider in a few weeks for physical exam her medication can be followed up at that time      Chronic pain syndrome    Trial of Celebrex 100 mg a day if needed.      Arthritis   Relevant Medications   celecoxib (CELEBREX) 100 MG capsule      Note: This dictation was prepared with Dragon dictation along with smaller phrase technology. Any transcriptional errors that result from this process are unintentional.

## 2015-09-23 ENCOUNTER — Telehealth: Payer: Self-pay | Admitting: Family Medicine

## 2015-09-23 NOTE — Telephone Encounter (Signed)
Received call from patient.   Reports that she has been taking Celebrex for 1 week, but noted some sx around Wednesday of last week. Reports that she has some panic attack like sx- intermittent tachycardia with SOB, but these episodes do not last more than 3 minutes, and then her heart rate returns to normal.   MD please advise.

## 2015-09-23 NOTE — Telephone Encounter (Signed)
Patient having some side effects from the celebrex that was prescribed, would like a call back regarding this  [phone]

## 2015-09-23 NOTE — Telephone Encounter (Signed)
Call placed to patient. LMTRC.  

## 2015-09-23 NOTE — Telephone Encounter (Signed)
I highly recommend she take the lexapro again, this will take time to get in her system She can also take her xanax like we discussed to help bridge her with the medication

## 2015-09-23 NOTE — Telephone Encounter (Signed)
Call placed to patient and patient made aware.   Advised that if patient continues to have questions or concerns, she can schedule appt with MD.

## 2015-09-23 NOTE — Telephone Encounter (Signed)
This shouldn't cause these symptoms, this is mostly break through anxiety make sure she is taking her lexapro, do this for 2 weeks, straight and then she can try the Celebrex again

## 2015-09-23 NOTE — Telephone Encounter (Signed)
Call placed to patient.   Reports that she is not taking the Lexapro. Reports that she noted increased anxiety, therefore she stopped taking the Lexapro on Thursday.   Reports that she took a Celebrex this AM for pain and noted tachycardia about 20 minutes after taking medication.   MD please advise.

## 2015-09-24 ENCOUNTER — Telehealth: Payer: Self-pay | Admitting: Family Medicine

## 2015-09-24 NOTE — Telephone Encounter (Signed)
[phone] Patient is calling to say that she is going to get back on her lexapro, and thank you for the help yesterday

## 2015-09-24 NOTE — Telephone Encounter (Signed)
MD to be made aware.  

## 2015-09-24 NOTE — Telephone Encounter (Signed)
noted 

## 2015-10-09 DIAGNOSIS — M4322 Fusion of spine, cervical region: Secondary | ICD-10-CM | POA: Diagnosis not present

## 2015-10-09 DIAGNOSIS — M542 Cervicalgia: Secondary | ICD-10-CM | POA: Diagnosis not present

## 2015-10-09 DIAGNOSIS — M791 Myalgia: Secondary | ICD-10-CM | POA: Diagnosis not present

## 2015-10-16 DIAGNOSIS — M542 Cervicalgia: Secondary | ICD-10-CM | POA: Diagnosis not present

## 2015-10-16 DIAGNOSIS — M6281 Muscle weakness (generalized): Secondary | ICD-10-CM | POA: Diagnosis not present

## 2015-10-17 ENCOUNTER — Ambulatory Visit (INDEPENDENT_AMBULATORY_CARE_PROVIDER_SITE_OTHER): Payer: Medicare Other | Admitting: Family Medicine

## 2015-10-17 ENCOUNTER — Telehealth: Payer: Self-pay | Admitting: Family Medicine

## 2015-10-17 ENCOUNTER — Encounter: Payer: Self-pay | Admitting: Family Medicine

## 2015-10-17 VITALS — BP 120/100 | HR 80 | Temp 97.7°F | Resp 14 | Ht 63.0 in | Wt 105.0 lb

## 2015-10-17 DIAGNOSIS — M542 Cervicalgia: Secondary | ICD-10-CM

## 2015-10-17 DIAGNOSIS — E785 Hyperlipidemia, unspecified: Secondary | ICD-10-CM | POA: Diagnosis not present

## 2015-10-17 DIAGNOSIS — G894 Chronic pain syndrome: Secondary | ICD-10-CM | POA: Diagnosis not present

## 2015-10-17 DIAGNOSIS — G43011 Migraine without aura, intractable, with status migrainosus: Secondary | ICD-10-CM

## 2015-10-17 DIAGNOSIS — Z0000 Encounter for general adult medical examination without abnormal findings: Secondary | ICD-10-CM

## 2015-10-17 DIAGNOSIS — Z79899 Other long term (current) drug therapy: Secondary | ICD-10-CM | POA: Diagnosis not present

## 2015-10-17 DIAGNOSIS — Z23 Encounter for immunization: Secondary | ICD-10-CM

## 2015-10-17 LAB — LIPID PANEL
CHOL/HDL RATIO: 1.5 ratio (ref <=5.0)
Cholesterol: 209 mg/dL — ABNORMAL HIGH (ref 125–200)
HDL: 138 mg/dL (ref >=46)
LDL CALC: 59 mg/dL (ref <130)
TRIGLYCERIDES: 62 mg/dL (ref <150)
VLDL: 12 mg/dL (ref <30)

## 2015-10-17 MED ORDER — PROPRANOLOL HCL 20 MG PO TABS: 20.0000 mg | ORAL_TABLET | Freq: Three times a day (TID) | ORAL | Status: DC

## 2015-10-17 MED ORDER — CYCLOBENZAPRINE HCL 10 MG PO TABS: 10.0000 mg | ORAL_TABLET | Freq: Three times a day (TID) | ORAL | Status: DC | PRN

## 2015-10-17 MED ORDER — ZOLPIDEM TARTRATE 10 MG PO TABS: 10.0000 mg | ORAL_TABLET | Freq: Every evening | ORAL | Status: DC | PRN

## 2015-10-17 NOTE — Addendum Note (Signed)
Addended by: Shary Decamp B on: 10/17/2015 12:40 PM   Modules accepted: Orders

## 2015-10-17 NOTE — Progress Notes (Signed)
Subjective:    Patient ID: Katelyn [name], female    DOB: 1945/12/05, 70 y.o.   MRN: [id number]  HPI 09/24/14 Patient is a very pleasant 70 year old white female who is here today to establish care. Her only concern is her chronic neck pain. She's had 2 surgeries on her neck. The most recent surgery was performed in November 2015 by Dr. Patrice Paradise.  She is currently using Dilaudid 2 mg by mouth twice a day for pain. She is tried anti-inflammatories in the past, muscle relaxers, Cymbalta, Lyrica, and gabapentin without benefit. 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 It is multilevel degenerative disc disease with the most significant impingement at L4. She recently underwent epidural steroid injection at L4 with minimal benefit. She is scheduled to see her neurosurgeon next week to discuss her next option. Her past medical history is also significant for osteoporosis, hypertension, hyperlipidemia. Her most recent colonoscopy was in 2015. They found 4 polyps. She is due again in 2018. Her mammogram is not due until this summer. She is due for repeat bone density. Her last Pap smear was 2 years ago and is not due again until next year.  At that time, my plan was: Patient's physical exam today is normal. I will schedule the patient for a bone density test. I will also check a CBC, CMP, fasting lipid panel, TSH, and vitamin D level. Patient received Pneumovax 23 today. I will give her Prevnar 35 Year. Her shingles vaccine is up-to-date. We did discuss the tetanus shot but she declined it at the present time.  I will gladly refill her Dilaudid in the future if necessary depending upon what she decides with her orthopedic surgeon.  05/03/67 2 weeks ago, the patient underwent fusion of her cervical spine and her upper thoracic spine under the care of Dr. Patrice Paradise.  She is using Dilaudid 4 mg every 4 hours in addition to baclofen and receiving very little pain relief. However she is very hesitant to  take any stronger medication. She has edema in her neck which appears to be dependent edema. It is equal and symmetric bilaterally. There is no swelling of the jugular vein. There is no JVD. There is no swelling of the face. There is no evidence of superior vena cava syndrome. I believe this is dependent edema due to her inability to move her neck in all and also just healing from her recent surgery. She denies any chest pain shortness of breath or dyspnea on exertion. Her biggest concern is her significant pain.  At that time, my plan was: Continue Dilaudid in addition to baclofen. She would like adding a standing dose anti-inflammatory such as diclofenac 75 mg by mouth twice a day. We'll also try warm compresses to relieve the muscle spasms. However if in one week, the patient experiences no benefit at all, I would be willing to start the patient on a fentanyl patch. I will start the patient a 25 g per 72 hours and increase as needed to provide pain relief. She will call us back next week and let us know how the medication is helping with her pain. I will monitor her kidney function as well as her liver function tests prior to making any changes in her pain medication  10/17/15 I have not seen the patient since that time. She is here today scheduled for a physical. In the interval time she is been seen my partner has been treating her for generalized anxiety disorder using  a combination of Lexapro and Xanax. She recently discontinued Lexapro because she felt that it was making her anxiety worse. She rarely takes Xanax.  She is here today for a physical exam but she is extremely tearful complaining of her chronic neck pain which has been well documented in the chart. She reports a pulling pain from the trapezius down to the inferior portion of her incision on both sides. She reports decreased range of motion and chronic stiffness and spasm. She also reports burning searing pain along the incision that is  unrelenting. At the present time she is taking Celebrex with little or no benefit. She also reports daily intractable migraine headaches. The headaches are located in her occiput as well as above her eyes. They're pounding in nature. They're associated with photophobia and phonophobia. She's had migraine headaches since her 18s. Her last mammogram was in September and she is not due again until September. She is over the age where she needs a hysterectomy here at colonoscopy is up-to-date. She is due for Prevnar 13. Her last bone density was last year and revealed osteoporosis. Recommend Fosamax however he never started the medication because she then underwent neck surgery. Past Medical History  Diagnosis Date  . Hypertension   . Hyperlipidemia   . Osteoporosis   . Arthritis     cervical and lumbar DDD   Past Surgical History  Procedure Laterality Date  . Colonoscopy    . Polypectomy    . Upper gastrointestinal endoscopy    . Bunionectomy      right  . Cervical fusion      C5-6-7  . Spine surgery       Dr. Patrice Paradise (x2, last Nov/15)   Current Outpatient Prescriptions on File Prior to Visit  Medication Sig Dispense Refill  . ALPRAZolam (XANAX) 0.5 MG tablet Take 1 tablet (0.5 mg total) by mouth 2 (two) times daily as needed for anxiety. 60 tablet 1  . Ascorbic Acid (VITAMIN C) 500 MG tablet Take 500 mg by mouth daily.      Marland Kitchen aspirin 81 MG tablet Take 81 mg by mouth daily.      . B Complex Vitamins (VITAMIN B COMPLEX PO) Take 1 tablet by mouth daily.      . calcium carbonate (OS-CAL) 600 MG TABS Take 600 mg by mouth 2 (two) times daily with a meal.      . fish oil-omega-3 fatty acids 1000 MG capsule Take 1 g by mouth 2 (two) times daily.      . Garlic 123XX123 MG CAPS Take 1 capsule by mouth daily.      Marland Kitchen glucosamine-chondroitin 500-400 MG tablet Take 1 tablet by mouth daily.      . hydrochlorothiazide (HYDRODIURIL) 25 MG tablet Take 1 tablet (25 mg total) by mouth daily. 90 tablet 3  .  Lysine 500 MG CAPS Take 1 capsule by mouth daily.      . magnesium oxide (MAG-OX) 400 MG tablet Take 400 mg by mouth daily.      . Multiple Vitamins-Minerals (MULTIVITAMIN WITH MINERALS) tablet Take 1 tablet by mouth daily.      Marland Kitchen POTASSIUM GLUCONATE PO Take 1 tablet by mouth daily.      . simvastatin (ZOCOR) 20 MG tablet Take 1 tablet (20 mg total) by mouth at bedtime. 90 tablet 3  . SUMAtriptan (IMITREX) 100 MG tablet Take 0.5 tablets (50 mg total) by mouth every 2 (two) hours as needed for migraine or headache. 10 tablet  5  . vitamin E 400 UNIT capsule Take 400 Units by mouth daily.      . celecoxib (CELEBREX) 100 MG capsule TAKE 1 CAPSULE BY MOUTH EVERY DAY AS NEEDED (Patient not taking: Reported on 10/17/2015) 90 capsule 3   No current facility-administered medications on file prior to visit.   Allergies  Allergen Reactions  . Prednisone Hives  . Hydrocodone Palpitations  . Red Dye Rash   Social History   Social History  . Marital Status: Married    Spouse Name: N/A  . Number of Children: N/A  . Years of Education: N/A   Occupational History  . Not on file.   Social History Main Topics  . Smoking status: Never Smoker   . Smokeless tobacco: Never Used  . Alcohol Use: 4.2 oz/week    7 Glasses of wine per week  . Drug Use: No  . Sexual Activity: Yes   Other Topics Concern  . Not on file   Social History Narrative   Family History  Problem Relation Age of Onset  . Colon polyps Mother   . Arthritis Mother   . Hearing loss Mother   . Hyperlipidemia Mother   . Hypertension Mother   . Stroke Mother   . Colon polyps Father   . Arthritis Father   . Heart disease Father   . Hyperlipidemia Father   . Colon cancer Neg Hx   . Pancreatic cancer Neg Hx   . Stomach cancer Neg Hx       Review of Systems  All other systems reviewed and are negative.      Objective:   Physical Exam  Constitutional: She is oriented to person, place, and time. She appears  well-developed and well-nourished. No distress.  HENT:  Head: Normocephalic and atraumatic.  Right Ear: External ear normal.  Left Ear: External ear normal.  Nose: Nose normal.  Mouth/Throat: Oropharynx is clear and moist. No oropharyngeal exudate.  Eyes: Conjunctivae and EOM are normal. Pupils are equal, round, and reactive to light. Right eye exhibits no discharge. Left eye exhibits no discharge. No scleral icterus.  Neck: No JVD present. No tracheal deviation present. No thyromegaly present.  Cardiovascular: Normal rate, regular rhythm and intact distal pulses.  Exam reveals no gallop and no friction rub.   No murmur heard. Pulmonary/Chest: Effort normal and breath sounds normal. No stridor. No respiratory distress. She has no wheezes. She has no rales. She exhibits no tenderness.  Abdominal: Soft. Bowel sounds are normal. She exhibits no distension and no mass. There is no tenderness. There is no rebound and no guarding.  Musculoskeletal: She exhibits no edema.       Cervical back: She exhibits decreased range of motion, tenderness, bony tenderness, swelling, pain and spasm.  Lymphadenopathy:    She has no cervical adenopathy.  Neurological: She is alert and oriented to person, place, and time. She has normal reflexes. No cranial nerve deficit. She exhibits normal muscle tone. Coordination normal.  Skin: Skin is warm and dry. No rash noted. She is not diaphoretic. No erythema. No pallor.  Psychiatric: Her speech is normal and behavior is normal. Judgment and thought content normal. Her mood appears anxious. Cognition and memory are normal.  Vitals reviewed.         Assessment & Plan:  Intractable migraine without aura and with status migrainosus - Plan: propranolol (INDERAL) 20 MG tablet  Neck pain - Plan: cyclobenzaprine (FLEXERIL) 10 MG tablet  Routine general medical examination at a  health care facility - Plan: Lipid panel  Chronic pain syndrome  Recommended a mammogram  this fall. Colonoscopy is up-to-date. She does not require a Pap smear. I recommended that she start Fosamax for her osteoporosis and recheck a bone density in 2 years. I will start her on propranolol 20 mg by mouth 3 times a day for migraine headaches to see if we can decrease the frequency of her headaches. I am also hopeful that this may help some of her anxiety that she may be having an help address her elevated diastolic blood pressure. I will start her on Flexeril 10 mg every 8 hours as needed for muscle spasms in the neck. I will see her back in 3 weeks to discuss possibly starting her on Topamax for migraine prevention as well as for neuropathic pain in her neck versus trying Lyrica depending upon her results but the medicines I'm giving her today. She received Prevnar 13. I will also check a fasting lipid panel and complete her fasting lab work

## 2015-10-17 NOTE — Telephone Encounter (Signed)
Pt would like to speak with someone about the price of the prescriptions called in for her today. Pt was advised that the Flexaril and Propranolol are both generic.  [phone]

## 2015-10-18 ENCOUNTER — Other Ambulatory Visit: Payer: Self-pay | Admitting: Family Medicine

## 2015-10-18 DIAGNOSIS — M6281 Muscle weakness (generalized): Secondary | ICD-10-CM | POA: Diagnosis not present

## 2015-10-18 DIAGNOSIS — M545 Low back pain: Secondary | ICD-10-CM | POA: Diagnosis not present

## 2015-10-18 MED ORDER — PROPRANOLOL HCL 20 MG PO TABS: 20.0000 mg | ORAL_TABLET | Freq: Three times a day (TID) | ORAL | Status: DC

## 2015-10-18 MED ORDER — CYCLOBENZAPRINE HCL 10 MG PO TABS
10.0000 mg | ORAL_TABLET | Freq: Three times a day (TID) | ORAL | Status: DC | PRN
Start: 2015-10-18 — End: 2016-01-29

## 2015-10-18 NOTE — Telephone Encounter (Signed)
Pt was concerned about the cost of the medications while they are generic they were expensive (per pt) - flexeril was sent to [facility] as it is on [facility] and 30 days supply of propranolol was sent to [facility]. Pt is aware.

## 2015-10-21 ENCOUNTER — Encounter: Payer: Self-pay | Admitting: Family Medicine

## 2015-10-23 DIAGNOSIS — M545 Low back pain: Secondary | ICD-10-CM | POA: Diagnosis not present

## 2015-10-23 DIAGNOSIS — M6281 Muscle weakness (generalized): Secondary | ICD-10-CM | POA: Diagnosis not present

## 2015-10-25 DIAGNOSIS — M545 Low back pain: Secondary | ICD-10-CM | POA: Diagnosis not present

## 2015-10-25 DIAGNOSIS — M6281 Muscle weakness (generalized): Secondary | ICD-10-CM | POA: Diagnosis not present

## 2015-10-28 DIAGNOSIS — M6281 Muscle weakness (generalized): Secondary | ICD-10-CM | POA: Diagnosis not present

## 2015-10-28 DIAGNOSIS — M545 Low back pain: Secondary | ICD-10-CM | POA: Diagnosis not present

## 2015-10-30 DIAGNOSIS — M545 Low back pain: Secondary | ICD-10-CM | POA: Diagnosis not present

## 2015-10-30 DIAGNOSIS — M6281 Muscle weakness (generalized): Secondary | ICD-10-CM | POA: Diagnosis not present

## 2015-11-04 ENCOUNTER — Other Ambulatory Visit: Payer: Self-pay | Admitting: Family Medicine

## 2015-11-04 NOTE — Telephone Encounter (Signed)
Refill appropriate and filled per protocol. 

## 2015-11-06 DIAGNOSIS — M545 Low back pain: Secondary | ICD-10-CM | POA: Diagnosis not present

## 2015-11-06 DIAGNOSIS — M6281 Muscle weakness (generalized): Secondary | ICD-10-CM | POA: Diagnosis not present

## 2015-11-07 ENCOUNTER — Encounter: Payer: Self-pay | Admitting: Family Medicine

## 2015-11-07 ENCOUNTER — Ambulatory Visit (INDEPENDENT_AMBULATORY_CARE_PROVIDER_SITE_OTHER): Payer: Medicare Other | Admitting: Family Medicine

## 2015-11-07 VITALS — BP 140/82 | HR 88 | Temp 98.2°F | Resp 16 | Ht 63.0 in | Wt 106.0 lb

## 2015-11-07 DIAGNOSIS — G894 Chronic pain syndrome: Secondary | ICD-10-CM

## 2015-11-07 DIAGNOSIS — F411 Generalized anxiety disorder: Secondary | ICD-10-CM

## 2015-11-07 DIAGNOSIS — M542 Cervicalgia: Secondary | ICD-10-CM | POA: Diagnosis not present

## 2015-11-07 DIAGNOSIS — G43011 Migraine without aura, intractable, with status migrainosus: Secondary | ICD-10-CM

## 2015-11-07 DIAGNOSIS — I10 Essential (primary) hypertension: Secondary | ICD-10-CM | POA: Diagnosis not present

## 2015-11-07 NOTE — Progress Notes (Signed)
Subjective:    Patient ID: Katelyn [name], female    DOB: 1945/12/14, 70 y.o.   MRN: [id number]  HPI 09/24/14 Patient is a very pleasant 70 year old white female who is here today to establish care. Her only concern is her chronic neck pain. She's had 2 surgeries on her neck. The most recent surgery was performed in November 2015 by Dr. Patrice Paradise.  She is currently using Dilaudid 2 mg by mouth twice a day for pain. She is tried anti-inflammatories in the past, muscle relaxers, Cymbalta, Lyrica, and gabapentin without benefit. 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 It is multilevel degenerative disc disease with the most significant impingement at L4. She recently underwent epidural steroid injection at L4 with minimal benefit. She is scheduled to see her neurosurgeon next week to discuss her next option. Her past medical history is also significant for osteoporosis, hypertension, hyperlipidemia. Her most recent colonoscopy was in 2015. They found 4 polyps. She is due again in 2018. Her mammogram is not due until this summer. She is due for repeat bone density. Her last Pap smear was 2 years ago and is not due again until next year.  At that time, my plan was: Patient's physical exam today is normal. I will schedule the patient for a bone density test. I will also check a CBC, CMP, fasting lipid panel, TSH, and vitamin D level. Patient received Pneumovax 23 today. I will give her Prevnar 83 Year. Her shingles vaccine is up-to-date. We did discuss the tetanus shot but she declined it at the present time.  I will gladly refill her Dilaudid in the future if necessary depending upon what she decides with her orthopedic surgeon.  05/03/15 2 weeks ago, the patient underwent fusion of her cervical spine and her upper thoracic spine under the care of Dr. Patrice Paradise.  She is using Dilaudid 4 mg every 4 hours in addition to baclofen and receiving very little pain relief. However she is very hesitant to  take any stronger medication. She has edema in her neck which appears to be dependent edema. It is equal and symmetric bilaterally. There is no swelling of the jugular vein. There is no JVD. There is no swelling of the face. There is no evidence of superior vena cava syndrome. I believe this is dependent edema due to her inability to move her neck in all and also just healing from her recent surgery. She denies any chest pain shortness of breath or dyspnea on exertion. Her biggest concern is her significant pain.  At that time, my plan was: Continue Dilaudid in addition to baclofen. She would like adding a standing dose anti-inflammatory such as diclofenac 75 mg by mouth twice a day. We'll also try warm compresses to relieve the muscle spasms. However if in one week, the patient experiences no benefit at all, I would be willing to start the patient on a fentanyl patch. I will start the patient a 25 g per 72 hours and increase as needed to provide pain relief. She will call us back next week and let us know how the medication is helping with her pain. I will monitor her kidney function as well as her liver function tests prior to making any changes in her pain medication  10/17/15 I have not seen the patient since that time. She is here today scheduled for a physical. In the interval time she is been seen my partner has been treating her for generalized anxiety disorder using  a combination of Lexapro and Xanax. She recently discontinued Lexapro because she felt that it was making her anxiety worse. She rarely takes Xanax.  She is here today for a physical exam but she is extremely tearful complaining of her chronic neck pain which has been well documented in the chart. She reports a pulling pain from the trapezius down to the inferior portion of her incision on both sides. She reports decreased range of motion and chronic stiffness and spasm. She also reports burning searing pain along the incision that is  unrelenting. At the present time she is taking Celebrex with little or no benefit. She also reports daily intractable migraine headaches. The headaches are located in her occiput as well as above her eyes. They're pounding in nature. They're associated with photophobia and phonophobia. She's had migraine headaches since her 68s. Her last mammogram was in September and she is not due again until September. She is over the age where she needs a hysterectomy here at colonoscopy is up-to-date. She is due for Prevnar 13. Her last bone density was last year and revealed osteoporosis. Recommend Fosamax however he never started the medication because she then underwent neck surgery.  At that time, my plan was: Recommended a mammogram this fall. Colonoscopy is up-to-date. She does not require a Pap smear. I recommended that she start Fosamax for her osteoporosis and recheck a bone density in 2 years. I will start her on propranolol 20 mg by mouth 3 times a day for migraine headaches to see if we can decrease the frequency of her headaches. I am also hopeful that this may help some of her anxiety that she may be having an help address her elevated diastolic blood pressure. I will start her on Flexeril 10 mg every 8 hours as needed for muscle spasms in the neck. I will see her back in 3 weeks to discuss possibly starting her on Topamax for migraine prevention as well as for neuropathic pain in her neck versus trying Lyrica depending upon her results from the medicines I'm giving her today. She received Prevnar 13. I will also check a fasting lipid panel and complete her fasting lab work.  11/07/15 Patient started the Flexeril which is helped her neck substantially. She is taking it 1-2 times a day. It does make her feel discombobulated slightly. She took the propranolol probably one week because she attributed a rapid heartbeat to the propranolol. She is extremely anxious about taking the medication. I explained to the  patient that the propranolol would actually help a rapid heartbeat but she is convinced that the propranolol seem to trigger it. When she was on propranolol however the headache seem better even though she was only taking it for one week. Her blood pressures at home have been ranging between 120 and 137/80-93 Past Medical History  Diagnosis Date  . Hypertension   . Hyperlipidemia   . Osteoporosis   . Arthritis     cervical and lumbar DDD   Past Surgical History  Procedure Laterality Date  . Colonoscopy    . Polypectomy    . Upper gastrointestinal endoscopy    . Bunionectomy      right  . Cervical fusion      C5-6-7  . Spine surgery       Dr. Patrice Paradise (x2, last Nov/15)   Current Outpatient Prescriptions on File Prior to Visit  Medication Sig Dispense Refill  . ALPRAZolam (XANAX) 0.5 MG tablet Take 1 tablet (0.5 mg total)  by mouth 2 (two) times daily as needed for anxiety. 60 tablet 1  . Ascorbic Acid (VITAMIN C) 500 MG tablet Take 500 mg by mouth daily.      Marland Kitchen aspirin 81 MG tablet Take 81 mg by mouth daily.      . B Complex Vitamins (VITAMIN B COMPLEX PO) Take 1 tablet by mouth daily.      . calcium carbonate (OS-CAL) 600 MG TABS Take 600 mg by mouth 2 (two) times daily with a meal.      . celecoxib (CELEBREX) 100 MG capsule TAKE 1 CAPSULE BY MOUTH EVERY DAY AS NEEDED (Patient not taking: Reported on 10/17/2015) 90 capsule 3  . cyclobenzaprine (FLEXERIL) 10 MG tablet Take 1 tablet (10 mg total) by mouth 3 (three) times daily as needed for muscle spasms. 90 tablet 0  . fish oil-omega-3 fatty acids 1000 MG capsule Take 1 g by mouth 2 (two) times daily.      . Garlic 123XX123 MG CAPS Take 1 capsule by mouth daily.      Marland Kitchen glucosamine-chondroitin 500-400 MG tablet Take 1 tablet by mouth daily.      . hydrochlorothiazide (HYDRODIURIL) 25 MG tablet Take 1 tablet (25 mg total) by mouth daily. 90 tablet 3  . Lysine 500 MG CAPS Take 1 capsule by mouth daily.      . magnesium oxide (MAG-OX) 400 MG  tablet Take 400 mg by mouth daily.      . Multiple Vitamins-Minerals (MULTIVITAMIN WITH MINERALS) tablet Take 1 tablet by mouth daily.      Marland Kitchen POTASSIUM GLUCONATE PO Take 1 tablet by mouth daily.      . propranolol (INDERAL) 20 MG tablet TAKE 1 TABLET(20 MG) BY MOUTH THREE TIMES DAILY 30 tablet 0  . simvastatin (ZOCOR) 20 MG tablet Take 1 tablet (20 mg total) by mouth at bedtime. 90 tablet 3  . SUMAtriptan (IMITREX) 100 MG tablet TAKE 1/2 TABLET(50 MG) BY MOUTH EVERY 2 HOURS AS NEEDED FOR MIGRAINE OR HEADACHE 10 tablet 0  . vitamin E 400 UNIT capsule Take 400 Units by mouth daily.      Marland Kitchen zolpidem (AMBIEN) 10 MG tablet Take 1 tablet (10 mg total) by mouth at bedtime as needed for sleep. 30 tablet 0   No current facility-administered medications on file prior to visit.   Allergies  Allergen Reactions  . Prednisone Hives  . Hydrocodone Palpitations  . Red Dye Rash   Social History   Social History  . Marital Status: Married    Spouse Name: N/A  . Number of Children: N/A  . Years of Education: N/A   Occupational History  . Not on file.   Social History Main Topics  . Smoking status: Never Smoker   . Smokeless tobacco: Never Used  . Alcohol Use: 4.2 oz/week    7 Glasses of wine per week  . Drug Use: No  . Sexual Activity: Yes   Other Topics Concern  . Not on file   Social History Narrative   Family History  Problem Relation Age of Onset  . Colon polyps Mother   . Arthritis Mother   . Hearing loss Mother   . Hyperlipidemia Mother   . Hypertension Mother   . Stroke Mother   . Colon polyps Father   . Arthritis Father   . Heart disease Father   . Hyperlipidemia Father   . Colon cancer Neg Hx   . Pancreatic cancer Neg Hx   . Stomach cancer  Neg Hx       Review of Systems  All other systems reviewed and are negative.      Objective:   Physical Exam  Constitutional: She appears well-developed and well-nourished.  HENT:  Head: Normocephalic and atraumatic.    Cardiovascular: Normal rate, regular rhythm and intact distal pulses.  Exam reveals no gallop and no friction rub.   No murmur heard. Pulmonary/Chest: Effort normal and breath sounds normal. No respiratory distress. She has no wheezes. She has no rales. She exhibits no tenderness.  Musculoskeletal: She exhibits no edema.       Cervical back: She exhibits decreased range of motion, tenderness, bony tenderness, swelling, pain and spasm.  Skin: Skin is warm and dry. No rash noted. No erythema. No pallor.  Psychiatric: Her speech is normal and behavior is normal. Judgment and thought content normal. Her mood appears anxious. Cognition and memory are normal.  Vitals reviewed.         Assessment & Plan:  Neck pain  Intractable migraine without aura and with status migrainosus  Essential hypertension  Chronic pain syndrome  GAD (generalized anxiety disorder)  As I explained to the patient, I truly believe this is a situation of "mind over matter". I believe she was convinced that the medications were going to give her side effects and when the Flexeril made her feel sedated, she became anxious triggering PVCs which she attributed to the propranolol. I don't believe the propranolol actually triggered the side effects. As I explained to the patient if anything it should help with the side effects. Since she is now tolerating the Flexeril well with no problems, I urge her to try the propranolol again. I believe this will help with the PVCs. I believe will help with her migraines. I believe it will help with her mildly elevated blood pressure. And I believe it may even help some with some of the anxiety that she feels by blocking natural adrenaline. She will try again.

## 2015-11-12 DIAGNOSIS — M545 Low back pain: Secondary | ICD-10-CM | POA: Diagnosis not present

## 2015-11-12 DIAGNOSIS — M542 Cervicalgia: Secondary | ICD-10-CM | POA: Diagnosis not present

## 2015-11-12 DIAGNOSIS — M6281 Muscle weakness (generalized): Secondary | ICD-10-CM | POA: Diagnosis not present

## 2015-11-14 DIAGNOSIS — M545 Low back pain: Secondary | ICD-10-CM | POA: Diagnosis not present

## 2015-11-14 DIAGNOSIS — M542 Cervicalgia: Secondary | ICD-10-CM | POA: Diagnosis not present

## 2015-11-14 DIAGNOSIS — M6281 Muscle weakness (generalized): Secondary | ICD-10-CM | POA: Diagnosis not present

## 2015-11-19 ENCOUNTER — Telehealth: Payer: Self-pay | Admitting: Family Medicine

## 2015-11-19 DIAGNOSIS — M6281 Muscle weakness (generalized): Secondary | ICD-10-CM | POA: Diagnosis not present

## 2015-11-19 DIAGNOSIS — M542 Cervicalgia: Secondary | ICD-10-CM | POA: Diagnosis not present

## 2015-11-19 DIAGNOSIS — M545 Low back pain: Secondary | ICD-10-CM | POA: Diagnosis not present

## 2015-11-19 NOTE — Telephone Encounter (Signed)
Pt called to advise Dr. Dennard Schaumann that the Propranolol is making her heart race. She experienced has experienced this with this medication before. She is very concerned and wants to know if she should discontinue use.

## 2015-11-20 NOTE — Telephone Encounter (Signed)
Pt states that she had the symptoms over the weekend.  Today she is asymptomatic and she took the medication this morning.  BP was 122/78 yesterday however she has not taken it today.  Pulse was 67.  Instructed patient to not take it tomorrow until she hears from [place].  She will check BP in the morning

## 2015-11-21 ENCOUNTER — Ambulatory Visit (INDEPENDENT_AMBULATORY_CARE_PROVIDER_SITE_OTHER): Payer: Medicare Other | Admitting: Family Medicine

## 2015-11-21 VITALS — BP 138/84 | HR 76 | Temp 98.5°F | Resp 14 | Ht 66.0 in | Wt 103.0 lb

## 2015-11-21 DIAGNOSIS — R079 Chest pain, unspecified: Secondary | ICD-10-CM

## 2015-11-21 NOTE — Telephone Encounter (Signed)
Pt seen today

## 2015-11-21 NOTE — Telephone Encounter (Signed)
Do not take the propranolol again.

## 2015-11-21 NOTE — Progress Notes (Signed)
Subjective:    Patient ID: Katelyn [name], female    DOB: 1945/12/05, 70 y.o.   MRN: [id number]  HPI 09/24/14 Patient is a very pleasant 70 year old white female who is here today to establish care. Her only concern is her chronic neck pain. She's had 2 surgeries on her neck. The most recent surgery was performed in November 2015 by Dr. Patrice Paradise.  She is currently using Dilaudid 2 mg by mouth twice a day for pain. She is tried anti-inflammatories in the past, muscle relaxers, Cymbalta, Lyrica, and gabapentin without benefit. 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 It is multilevel degenerative disc disease with the most significant impingement at L4. She recently underwent epidural steroid injection at L4 with minimal benefit. She is scheduled to see her neurosurgeon next week to discuss her next option. Her past medical history is also significant for osteoporosis, hypertension, hyperlipidemia. Her most recent colonoscopy was in 2015. They found 4 polyps. She is due again in 2018. Her mammogram is not due until this summer. She is due for repeat bone density. Her last Pap smear was 2 years ago and is not due again until next year.  At that time, my plan was: Patient's physical exam today is normal. I will schedule the patient for a bone density test. I will also check a CBC, CMP, fasting lipid panel, TSH, and vitamin D level. Patient received Pneumovax 23 today. I will give her Prevnar 35 Year. Her shingles vaccine is up-to-date. We did discuss the tetanus shot but she declined it at the present time.  I will gladly refill her Dilaudid in the future if necessary depending upon what she decides with her orthopedic surgeon.  05/03/15 2 weeks ago, the patient underwent fusion of her cervical spine and her upper thoracic spine under the care of Dr. Patrice Paradise.  She is using Dilaudid 4 mg every 4 hours in addition to baclofen and receiving very little pain relief. However she is very hesitant to  take any stronger medication. She has edema in her neck which appears to be dependent edema. It is equal and symmetric bilaterally. There is no swelling of the jugular vein. There is no JVD. There is no swelling of the face. There is no evidence of superior vena cava syndrome. I believe this is dependent edema due to her inability to move her neck in all and also just healing from her recent surgery. She denies any chest pain shortness of breath or dyspnea on exertion. Her biggest concern is her significant pain.  At that time, my plan was: Continue Dilaudid in addition to baclofen. She would like adding a standing dose anti-inflammatory such as diclofenac 75 mg by mouth twice a day. We'll also try warm compresses to relieve the muscle spasms. However if in one week, the patient experiences no benefit at all, I would be willing to start the patient on a fentanyl patch. I will start the patient a 25 g per 72 hours and increase as needed to provide pain relief. She will call us back next week and let us know how the medication is helping with her pain. I will monitor her kidney function as well as her liver function tests prior to making any changes in her pain medication  10/17/15 I have not seen the patient since that time. She is here today scheduled for a physical. In the interval time she is been seen my partner has been treating her for generalized anxiety disorder using  a combination of Lexapro and Xanax. She recently discontinued Lexapro because she felt that it was making her anxiety worse. She rarely takes Xanax.  She is here today for a physical exam but she is extremely tearful complaining of her chronic neck pain which has been well documented in the chart. She reports a pulling pain from the trapezius down to the inferior portion of her incision on both sides. She reports decreased range of motion and chronic stiffness and spasm. She also reports burning searing pain along the incision that is  unrelenting. At the present time she is taking Celebrex with little or no benefit. She also reports daily intractable migraine headaches. The headaches are located in her occiput as well as above her eyes. They're pounding in nature. They're associated with photophobia and phonophobia. She's had migraine headaches since her 70s. Her last mammogram was in September and she is not due again until September. She is over the age where she needs a hysterectomy here at colonoscopy is up-to-date. She is due for Prevnar 13. Her last bone density was last year and revealed osteoporosis. Recommend Fosamax however he never started the medication because she then underwent neck surgery.  At that time, my plan was: Recommended a mammogram this fall. Colonoscopy is up-to-date. She does not require a Pap smear. I recommended that she start Fosamax for her osteoporosis and recheck a bone density in 2 years. I will start her on propranolol 20 mg by mouth 3 times a day for migraine headaches to see if we can decrease the frequency of her headaches. I am also hopeful that this may help some of her anxiety that she may be having an help address her elevated diastolic blood pressure. I will start her on Flexeril 10 mg every 8 hours as needed for muscle spasms in the neck. I will see her back in 3 weeks to discuss possibly starting her on Topamax for migraine prevention as well as for neuropathic pain in her neck versus trying Lyrica depending upon her results from the medicines I'm giving her today. She received Prevnar 13. I will also check a fasting lipid panel and complete her fasting lab work.  11/07/15 Patient started the Flexeril which is helped her neck substantially. She is taking it 1-2 times a day. It does make her feel discombobulated slightly. She took the propranolol probably one week because she attributed a rapid heartbeat to the propranolol. She is extremely anxious about taking the medication. I explained to the  patient that the propranolol would actually help a rapid heartbeat but she is convinced that the propranolol seem to trigger it. When she was on propranolol however the headache seem better even though she was only taking it for one week. Her blood pressures at home have been ranging between 120 and 137/80-93.  At that time, my plan was: As I explained to the patient, I truly believe this is a situation of "mind over matter". I believe she was convinced that the medications were going to give her side effects and when the Flexeril made her feel sedated, she became anxious triggering PVCs which she attributed to the propranolol. I don't believe the propranolol actually triggered the side effects. As I explained to the patient if anything it should help with the side effects. Since she is now tolerating the Flexeril well with no problems, I urge her to try the propranolol again. I believe this will help with the PVCs. I believe will help with her migraines.  I believe it will help with her mildly elevated blood pressure. And I believe it may even help some with some of the anxiety that she feels by blocking natural adrenaline. She will try again. 11/21/15 Patient is worked in today due to chest pain. She walked in complaining of discomfort in the center of her chest.  It feels tight. However she appears calm she is not diaphoretic. She is in no apparent pain. Her EKG today shows normal sinus rhythm with no ST segment changes or signs of ischemia or infarction. She denies any angina or shortness of breath. Symptoms began after she resumed propranolol. It continues to cause her PVCs and tachycardia or loose she believes that it does. She seems extremely anxious and nervous. She denies any nausea or vomiting. She denies any radiation of the pain into her jaw or into her arm. She denies any pleurisy Past Medical History  Diagnosis Date  . Hypertension   . Hyperlipidemia   . Osteoporosis   . Arthritis     cervical  and lumbar DDD   Past Surgical History  Procedure Laterality Date  . Colonoscopy    . Polypectomy    . Upper gastrointestinal endoscopy    . Bunionectomy      right  . Cervical fusion      C5-6-7  . Spine surgery       Dr. Patrice Paradise (x2, last Nov/15)   Current Outpatient Prescriptions on File Prior to Visit  Medication Sig Dispense Refill  . ALPRAZolam (XANAX) 0.5 MG tablet Take 1 tablet (0.5 mg total) by mouth 2 (two) times daily as needed for anxiety. 60 tablet 1  . Ascorbic Acid (VITAMIN C) 500 MG tablet Take 500 mg by mouth daily.      Marland Kitchen aspirin 81 MG tablet Take 81 mg by mouth daily.      . B Complex Vitamins (VITAMIN B COMPLEX PO) Take 1 tablet by mouth daily.      . calcium carbonate (OS-CAL) 600 MG TABS Take 600 mg by mouth 2 (two) times daily with a meal.      . cyclobenzaprine (FLEXERIL) 10 MG tablet Take 1 tablet (10 mg total) by mouth 3 (three) times daily as needed for muscle spasms. 90 tablet 0  . fish oil-omega-3 fatty acids 1000 MG capsule Take 1 g by mouth 2 (two) times daily.      . Garlic 123XX123 MG CAPS Take 1 capsule by mouth daily.      Marland Kitchen glucosamine-chondroitin 500-400 MG tablet Take 1 tablet by mouth daily.      . hydrochlorothiazide (HYDRODIURIL) 25 MG tablet Take 1 tablet (25 mg total) by mouth daily. 90 tablet 3  . Lysine 500 MG CAPS Take 1 capsule by mouth daily.      . magnesium oxide (MAG-OX) 400 MG tablet Take 400 mg by mouth daily.      . Multiple Vitamins-Minerals (MULTIVITAMIN WITH MINERALS) tablet Take 1 tablet by mouth daily.      Marland Kitchen POTASSIUM GLUCONATE PO Take 1 tablet by mouth daily.      . simvastatin (ZOCOR) 20 MG tablet Take 1 tablet (20 mg total) by mouth at bedtime. 90 tablet 3  . SUMAtriptan (IMITREX) 100 MG tablet TAKE 1/2 TABLET(50 MG) BY MOUTH EVERY 2 HOURS AS NEEDED FOR MIGRAINE OR HEADACHE 10 tablet 0  . vitamin E 400 UNIT capsule Take 400 Units by mouth daily.      Marland Kitchen zolpidem (AMBIEN) 10 MG tablet Take 1 tablet (  10 mg total) by mouth at  bedtime as needed for sleep. 30 tablet 0   No current facility-administered medications on file prior to visit.   Allergies  Allergen Reactions  . Prednisone Hives  . Hydrocodone Palpitations  . Red Dye Rash   Social History   Social History  . Marital Status: Married    Spouse Name: N/A  . Number of Children: N/A  . Years of Education: N/A   Occupational History  . Not on file.   Social History Main Topics  . Smoking status: Never Smoker   . Smokeless tobacco: Never Used  . Alcohol Use: 4.2 oz/week    7 Glasses of wine per week  . Drug Use: No  . Sexual Activity: Yes   Other Topics Concern  . Not on file   Social History Narrative   Family History  Problem Relation Age of Onset  . Colon polyps Mother   . Arthritis Mother   . Hearing loss Mother   . Hyperlipidemia Mother   . Hypertension Mother   . Stroke Mother   . Colon polyps Father   . Arthritis Father   . Heart disease Father   . Hyperlipidemia Father   . Colon cancer Neg Hx   . Pancreatic cancer Neg Hx   . Stomach cancer Neg Hx       Review of Systems  All other systems reviewed and are negative.      Objective:   Physical Exam  Constitutional: She appears well-developed and well-nourished.  HENT:  Head: Normocephalic and atraumatic.  Cardiovascular: Normal rate, regular rhythm and intact distal pulses.  Exam reveals no gallop and no friction rub.   No murmur heard. Pulmonary/Chest: Effort normal and breath sounds normal. No respiratory distress. She has no wheezes. She has no rales. She exhibits no tenderness.  Musculoskeletal: She exhibits no edema.       Cervical back: She exhibits decreased range of motion, tenderness, bony tenderness, swelling, pain and spasm.  Skin: Skin is warm and dry. No rash noted. No erythema. No pallor.  Psychiatric: Her speech is normal and behavior is normal. Judgment and thought content normal. Her mood appears anxious. Cognition and memory are normal.  Vitals  reviewed.         Assessment & Plan:  Chest pain, unspecified - Plan: EKG 12-Lead I do not believe is cardiovascular in nature. Her blood pressure is excellent. Her exam today is completely normal. There is no peripheral edema. There is no JVD. She is not diaphoretic. She does have an occasional PVC that I auscultate. I believe the patient is dealing with anxiety attacks. I recommend that she try Xanax 0.5 mg every 8 hours as needed for anxiety. If this helps, I would revisit starting her on a daily medication help manage her anxiety. Dr. Buelah Manis has recommended this in the past and she has not been successfully able to continue with his medications due to side effects.

## 2015-11-26 ENCOUNTER — Telehealth: Payer: Self-pay | Admitting: Family Medicine

## 2015-11-26 NOTE — Telephone Encounter (Signed)
Pt is still having issues with her heart rate and would like to know what she needs to do.  Please call [phone]

## 2015-11-27 NOTE — Telephone Encounter (Signed)
Spoke with patient regarding heart palpitations.  Patient is upset that we did not call her back in a timely manner.  She does not feel like the medication is helping and she feels worse.  She is short of breath and her chest feels heavy.  Advised patient to go to the emergency room and be evaluated.  Patient agreed.

## 2015-12-02 ENCOUNTER — Ambulatory Visit (INDEPENDENT_AMBULATORY_CARE_PROVIDER_SITE_OTHER): Payer: Medicare Other | Admitting: Family Medicine

## 2015-12-02 ENCOUNTER — Encounter: Payer: Self-pay | Admitting: Family Medicine

## 2015-12-02 VITALS — BP 128/68 | HR 76 | Temp 98.1°F | Resp 12 | Ht 63.0 in | Wt 104.0 lb

## 2015-12-02 DIAGNOSIS — F411 Generalized anxiety disorder: Secondary | ICD-10-CM

## 2015-12-02 DIAGNOSIS — G43009 Migraine without aura, not intractable, without status migrainosus: Secondary | ICD-10-CM | POA: Diagnosis not present

## 2015-12-02 DIAGNOSIS — G43909 Migraine, unspecified, not intractable, without status migrainosus: Secondary | ICD-10-CM | POA: Insufficient documentation

## 2015-12-02 MED ORDER — ESCITALOPRAM OXALATE 5 MG PO TABS: 5.0000 mg | ORAL_TABLET | Freq: Every day | ORAL | Status: DC

## 2015-12-02 NOTE — Patient Instructions (Signed)
Lexapro at bedtime Take 1 tablet of xanax in the morning, if needed take 1/2 tablet between 3-5pm, then take the 2nd half if needed around bedtime  Take the propanolol three times a day   F/U 4 weeks

## 2015-12-03 ENCOUNTER — Encounter: Payer: Self-pay | Admitting: Family Medicine

## 2015-12-03 NOTE — Progress Notes (Signed)
Patient ID: Katelyn [name], female   DOB: February 06, 1946, 70 y.o.   MRN: [id number]    Subjective:    Patient ID: Katelyn [name], female    DOB: 04-Jan-1946, 70 y.o.   MRN: [id number]  Patient presents for Medication Management Patient discuss her medications. She states that she is now, reality as she does have significant anxiety and actually restarted her Lexapro 5 mg back last Thursday. She is also using Xanax once in the appropriate way to take it. She only takes a half a tablet in the morning she is very anxious by noon she does better with one full tablet in the morning. She is also on propanolol for migraine headaches which a lot of this stems from stress and tension from her neck pain which is chronic. Shortly no how she can take the propanolol. She understands that a lot of her side effects are more somatic in anxiety related.    Review Of Systems:  GEN- denies fatigue, fever, weight loss,weakness, recent illness HEENT- denies eye drainage, change in vision, nasal discharge, CVS- denies chest pain, palpitations RESP- denies SOB, cough, wheeze ABD- denies N/V, change in stools, abd pain GU- denies dysuria, hematuria, dribbling, incontinence MSK- + joint pain, muscle aches, injury Neuro- denies headache, dizziness, syncope, seizure activity       Objective:    BP 128/68 mmHg  Pulse 76  Temp(Src) 98.1 F (36.7 C) (Oral)  Resp 12  Ht 5\' 3"  (1.6 m)  Wt 104 lb (47.174 kg)  BMI 18.43 kg/m2 GEN- NAD, alert and oriented x3 Psych- anxious appearing, not depressed, good eye contact, very polite, no apparent hallucinations, no SI        Assessment & Plan:      Problem List Items Addressed This Visit    Migraines    We'll continue the propanolol 3 times a day F think that this will also help with her anxiety we'll be able to back down on the use of the alprazolam which she gets on a good dose of Lexapro as well. He agrees to take these medications for at least the next 4 weeks  so we get a good trial medicines before she discontinues them early which she often does.      Relevant Medications   escitalopram (LEXAPRO) 5 MG tablet   GAD (generalized anxiety disorder) - Primary    She is in agreement with taking the Lexapro. She will continue the 5 mg Will follow-up in 4 weeks. I did broach the subject of therapy to help with her stressors and anxieties but she declines this.         Note: This dictation was prepared with Dragon dictation along with smaller phrase technology. Any transcriptional errors that result from this process are unintentional.

## 2015-12-03 NOTE — Assessment & Plan Note (Signed)
She is in agreement with taking the Lexapro. She will continue the 5 mg Will follow-up in 4 weeks. I did broach the subject of therapy to help with her stressors and anxieties but she declines this.

## 2015-12-03 NOTE — Assessment & Plan Note (Signed)
We'll continue the propanolol 3 times a day F think that this will also help with her anxiety we'll be able to back down on the use of the alprazolam which she gets on a good dose of Lexapro as well. He agrees to take these medications for at least the next 4 weeks so we get a good trial medicines before she discontinues them early which she often does.

## 2015-12-04 DIAGNOSIS — M542 Cervicalgia: Secondary | ICD-10-CM | POA: Diagnosis not present

## 2015-12-04 DIAGNOSIS — M545 Low back pain: Secondary | ICD-10-CM | POA: Diagnosis not present

## 2015-12-04 DIAGNOSIS — M6281 Muscle weakness (generalized): Secondary | ICD-10-CM | POA: Diagnosis not present

## 2015-12-05 DIAGNOSIS — M6281 Muscle weakness (generalized): Secondary | ICD-10-CM | POA: Diagnosis not present

## 2015-12-05 DIAGNOSIS — M542 Cervicalgia: Secondary | ICD-10-CM | POA: Diagnosis not present

## 2015-12-05 DIAGNOSIS — M545 Low back pain: Secondary | ICD-10-CM | POA: Diagnosis not present

## 2015-12-09 DIAGNOSIS — M542 Cervicalgia: Secondary | ICD-10-CM | POA: Diagnosis not present

## 2015-12-09 DIAGNOSIS — M545 Low back pain: Secondary | ICD-10-CM | POA: Diagnosis not present

## 2015-12-09 DIAGNOSIS — M6281 Muscle weakness (generalized): Secondary | ICD-10-CM | POA: Diagnosis not present

## 2015-12-11 DIAGNOSIS — M6281 Muscle weakness (generalized): Secondary | ICD-10-CM | POA: Diagnosis not present

## 2015-12-11 DIAGNOSIS — M542 Cervicalgia: Secondary | ICD-10-CM | POA: Diagnosis not present

## 2015-12-11 DIAGNOSIS — M545 Low back pain: Secondary | ICD-10-CM | POA: Diagnosis not present

## 2015-12-12 ENCOUNTER — Other Ambulatory Visit: Payer: Self-pay | Admitting: Family Medicine

## 2015-12-12 NOTE — Telephone Encounter (Signed)
Refill appropriate and filled per protocol. 

## 2015-12-16 DIAGNOSIS — M6281 Muscle weakness (generalized): Secondary | ICD-10-CM | POA: Diagnosis not present

## 2015-12-16 DIAGNOSIS — M545 Low back pain: Secondary | ICD-10-CM | POA: Diagnosis not present

## 2015-12-16 DIAGNOSIS — M542 Cervicalgia: Secondary | ICD-10-CM | POA: Diagnosis not present

## 2015-12-18 DIAGNOSIS — M6281 Muscle weakness (generalized): Secondary | ICD-10-CM | POA: Diagnosis not present

## 2015-12-18 DIAGNOSIS — M542 Cervicalgia: Secondary | ICD-10-CM | POA: Diagnosis not present

## 2015-12-18 DIAGNOSIS — M545 Low back pain: Secondary | ICD-10-CM | POA: Diagnosis not present

## 2015-12-18 DIAGNOSIS — M4322 Fusion of spine, cervical region: Secondary | ICD-10-CM | POA: Diagnosis not present

## 2015-12-18 DIAGNOSIS — M5106 Intervertebral disc disorders with myelopathy, lumbar region: Secondary | ICD-10-CM | POA: Diagnosis not present

## 2015-12-19 ENCOUNTER — Telehealth: Payer: Self-pay | Admitting: Family Medicine

## 2015-12-19 NOTE — Telephone Encounter (Signed)
Having some inflammation in neck and back since surgery last October.   Has some Diclofenac at home and wants to know if it would be OK for her to use along with other medications?

## 2015-12-20 NOTE — Telephone Encounter (Signed)
Call placed to patient and patient made aware per VM.  

## 2015-12-20 NOTE — Telephone Encounter (Signed)
Yes, take with food

## 2015-12-31 ENCOUNTER — Ambulatory Visit (INDEPENDENT_AMBULATORY_CARE_PROVIDER_SITE_OTHER): Payer: Medicare Other | Admitting: Family Medicine

## 2015-12-31 ENCOUNTER — Telehealth: Payer: Self-pay | Admitting: *Deleted

## 2015-12-31 ENCOUNTER — Encounter: Payer: Self-pay | Admitting: Family Medicine

## 2015-12-31 VITALS — BP 126/74 | HR 80 | Temp 98.9°F | Resp 16 | Ht 63.0 in | Wt 106.0 lb

## 2015-12-31 DIAGNOSIS — G43009 Migraine without aura, not intractable, without status migrainosus: Secondary | ICD-10-CM | POA: Diagnosis not present

## 2015-12-31 DIAGNOSIS — F411 Generalized anxiety disorder: Secondary | ICD-10-CM

## 2015-12-31 MED ORDER — PROPRANOLOL HCL 10 MG PO TABS: 10.0000 mg | ORAL_TABLET | Freq: Three times a day (TID) | ORAL | Status: DC

## 2015-12-31 MED ORDER — ESCITALOPRAM OXALATE 5 MG PO TABS: 5.0000 mg | ORAL_TABLET | Freq: Every day | ORAL | Status: DC

## 2015-12-31 MED ORDER — SUMATRIPTAN SUCCINATE 100 MG PO TABS: ORAL_TABLET | ORAL | Status: DC

## 2015-12-31 MED ORDER — ALPRAZOLAM 0.5 MG PO TABS: 0.5000 mg | ORAL_TABLET | Freq: Two times a day (BID) | ORAL | Status: DC | PRN

## 2015-12-31 MED ORDER — TRAMADOL HCL 50 MG PO TABS: 50.0000 mg | ORAL_TABLET | Freq: Two times a day (BID) | ORAL | Status: DC | PRN

## 2015-12-31 NOTE — Telephone Encounter (Signed)
Patient in office for F/U.   Reports that Propanolol is too expensive.   Call placed to pharmacy. Was advised that Propanolol 20mg  PO TID was filled on 10/18/2015 and co-pay was $34 for 30 days.   MD to be made aware.

## 2015-12-31 NOTE — Progress Notes (Signed)
Patient ID: Katelyn [name], female   DOB: 12-03-1945, 70 y.o.   MRN: [id number]    Subjective:    Patient ID: Katelyn [name], female    DOB: [birth year], 70 y.o.   MRN: [id number]  Patient presents for F/U Patient for follow-up on her generalized anxiety she's been taking her Lexapro 5 mg daily along with Xanax 0.5 typically half a tablet in the morning. She states that this is helped with her anxiety she and her husband can tell a difference. She continues to have severe headaches and neck pain the propanolol has not helped very much but she still uses it as a this is also helping with her anxiety and the background. She has been taking imitrex almost daily. She went to her neurosurgeon who prescribed meloxicam she's not had any improvement with it. She is also planning to have a CT of her neck to be done in the next week or 2. She is asking for something for pain. She did notice the end of visit that the propanolol was costing her about $60    Review Of Systems:  GEN- denies fatigue, fever, weight loss,weakness, recent illness HEENT- denies eye drainage, change in vision, nasal discharge, CVS- denies chest pain, palpitations RESP- denies SOB, cough, wheeze ABD- denies N/V, change in stools, abd pain GU- denies dysuria, hematuria, dribbling, incontinence MSK- + joint pain, muscle aches, injury Neuro- denies headache, dizziness, syncope, seizure activity       Objective:    BP 126/74 mmHg  Pulse 80  Temp(Src) 98.9 F (37.2 C) (Oral)  Resp 16  Ht 5\' 3"  (1.6 m)  Wt 106 lb (48.081 kg)  BMI 18.78 kg/m2 GEN- NAD, alert and oriented x3 Neck- Supple, stiff ROM  CVS- RRR, no murmur RESP-CTAB Psych- very pleasant, not anxious appearing, not depressed  Pulses- Radial, 2+        Assessment & Plan:      Problem List Items Addressed This Visit    Migraines - Primary    She continues to have headaches but now is also getting rebound headaches from overuse of Imitrex. Have given  her tramadol there is still a lot of psychological component to her chronic pain she also has issues with multiple neck surgeries. We'll await the CT scan results from her neurosurgeon      Relevant Medications   meloxicam (MOBIC) 15 MG tablet   propranolol (INDERAL) 10 MG tablet   traMADol (ULTRAM) 50 MG tablet   SUMAtriptan (IMITREX) 100 MG tablet   escitalopram (LEXAPRO) 5 MG tablet   GAD (generalized anxiety disorder)    Improved with the Lexapro. I'm not going to push the dose that she seems to be doing fairly well at the 5 mg. She will also continue the benzodiazepine as prescribed         Note: This dictation was prepared with Dragon dictation along with smaller phrase technology. Any transcriptional errors that result from this process are unintentional.

## 2015-12-31 NOTE — Assessment & Plan Note (Signed)
Improved with the Lexapro. I'm not going to push the dose that she seems to be doing fairly well at the 5 mg. She will also continue the benzodiazepine as prescribed

## 2015-12-31 NOTE — Assessment & Plan Note (Signed)
She continues to have headaches but now is also getting rebound headaches from overuse of Imitrex. Have given her tramadol there is still a lot of psychological component to her chronic pain she also has issues with multiple neck surgeries. We'll await the CT scan results from her neurosurgeon

## 2015-12-31 NOTE — Patient Instructions (Addendum)
Reserve the Imitrex for severe headaches only  Ultram as needed twice a day  Continue all other medications F/U 3 months

## 2015-12-31 NOTE — Telephone Encounter (Signed)
Call pt the cost is $34, her dose was actually 20mg  TID, we can change to 20mg  twice a day and see how she does with the use of the ultram like we discussed today for her headaches

## 2016-01-01 ENCOUNTER — Other Ambulatory Visit: Payer: Self-pay | Admitting: Orthopaedic Surgery

## 2016-01-01 DIAGNOSIS — M4322 Fusion of spine, cervical region: Secondary | ICD-10-CM

## 2016-01-01 MED ORDER — PROPRANOLOL HCL 20 MG PO TABS: 20.0000 mg | ORAL_TABLET | Freq: Two times a day (BID) | ORAL | Status: DC

## 2016-01-01 NOTE — Telephone Encounter (Signed)
Call placed to patient.   Reports that she will take Propanolol 20mg  BID with tramadol as needed.   Prescription sent to pharmacy.

## 2016-01-06 ENCOUNTER — Ambulatory Visit
Admission: RE | Admit: 2016-01-06 | Discharge: 2016-01-06 | Disposition: A | Discharge status: Home / Self Care | Payer: Medicare Other | Source: Ambulatory Visit | Attending: Orthopaedic Surgery | Admitting: Orthopaedic Surgery

## 2016-01-06 DIAGNOSIS — M4322 Fusion of spine, cervical region: Secondary | ICD-10-CM | POA: Diagnosis not present

## 2016-01-08 DIAGNOSIS — M542 Cervicalgia: Secondary | ICD-10-CM | POA: Diagnosis not present

## 2016-01-08 DIAGNOSIS — M4322 Fusion of spine, cervical region: Secondary | ICD-10-CM | POA: Diagnosis not present

## 2016-01-29 ENCOUNTER — Ambulatory Visit (INDEPENDENT_AMBULATORY_CARE_PROVIDER_SITE_OTHER): Payer: Medicare Other | Admitting: Neurology

## 2016-01-29 ENCOUNTER — Encounter: Payer: Self-pay | Admitting: Neurology

## 2016-01-29 VITALS — BP 145/91 | HR 68 | Ht 63.0 in | Wt 106.6 lb

## 2016-01-29 DIAGNOSIS — R519 Headache, unspecified: Secondary | ICD-10-CM

## 2016-01-29 DIAGNOSIS — R51 Headache: Secondary | ICD-10-CM | POA: Diagnosis not present

## 2016-01-29 DIAGNOSIS — G243 Spasmodic torticollis: Secondary | ICD-10-CM | POA: Diagnosis not present

## 2016-01-29 NOTE — Patient Instructions (Signed)
  Remember to drink plenty of fluid, eat healthy meals and do not skip any meals. Try to eat protein with a every meal and eat a healthy snack such as fruit or nuts in between meals. Try to keep a regular sleep-wake schedule and try to exercise daily, particularly in the form of walking, 20-30 minutes a day, if you can.   Botox For Cervical Dystonia  Our phone number is [phone]. We also have an after hours call service for urgent matters and there is a physician on-call for urgent questions. For any emergencies you know to call 911 or go to the nearest emergency room

## 2016-01-29 NOTE — Progress Notes (Signed)
[facility]    Provider:  Dr Jaynee Eagles Referring Provider: Susy Frizzle, MD Primary Care Physician:  Odette Fraction, MD  CC:  Neck muscle pain  HPI:  Katelyn [name] is a 70 y.o. female here as a referral from Dr. Dennard Schaumann for muscle and neck pain. Past medical history of migraines, status post C4-T3 posterior cervical fusion and laminectomy in October 2016 and previous ACDF C3-C7,  cervical spondylosis with myelopathy, chronic neck pain. She has had a bad time, she is crying in the office today. She has tried everything for the neck pain. She has tried medications, muscle relaxers, acupuncture, physical therapy. She could not tolerate muscle relaxers, anti-inflammatory drugs with no relief and she had shaking side effects, nausea, could not otlerate. Symptoms started over 5 years ago and slowly progressive. Pain and tightness in the cervical muscles. She is miserable. She is so stiff cannot move neck, decreased range of motion, can barely turn her neck. He has to move her whole body in order turn her head. So tight. Nothing makes it better. Worse with sitting for long periods. The muscles are so tight it will bring on a headache. She has a headache due to neck pain. Even tramadol does not help. It gives her horrible headaches. No vision changes.No FHx of dystonia or musculoskeletal disorder.   Reviewed notes, labs and imaging from outside physicians, which showed: Reviewed notes from spinous scoliosis specialist. She has cervical spine pain. Severity level is 5. Location of pain is in the neck. The patient describes the pain as an ache, burning, sharp and stabbing. Symptoms are aggravated by flexion, standing and walking. The patient denies relieving factors. Last visit was 01/08/2016. She has a past medical history of fusion of cervical region, C4-T3 posterior cervical fusion in October 2016. Patient developed increasing posterior neck pain. She is working with PT and done dry  needling without relief. No known injury at time of pain getting. Has trigger point injections in thoracic and cervical paraspinal musculature the short-term improvement. She has paraspinal muscular pain. She has discontinued Celebrex as it didn't seem to help. Toradol given last visit was only mildly. She disease moving because did not feel it was helping. Does not tolerate oral steroids due to hives and flushing. She has used Tylenol and tizanidine as needed in the past for pain relief.  CT scan of July 2017 shows healing C4-T3 posterior fusion as well as previous anterior fusion C5-T1. No apparent loosening of hardware acute findings.  She was sent here for evaluation of headaches.  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  Anterior fusion C5-T1 with anterior plate and screws in place.  C3-4 fusion utilizing screws which traverse through the posterior cortex and may impinge upon the dura as previously noted and unchanged. Lucency along the screws similar to prior exam. Small area fusion across the disc space.  Fusion C4-5 utilizing screws and interbody spacer. Fusion across the disc space.  New fusion posteriorly with C5 and C6 lateral mass screws. Pedicle screw T1 through T4. The right T4 pedicle screw partially traverses inferior cortex of the right T4 pedicle. Posterior connecting bar.  Mild scoliosis upper thoracic spine.  Streak artifact limits evaluation.  C2-3 no significant spinal stenosis.  C3-4 spur with mild to slightly moderate spinal stenosis. Mild right-sided and moderate left-sided foraminal narrowing.  C4-5 no osseous spinal stenosis/ foraminal narrowing.  C5-6 mild bilateral foraminal narrowing.  C6-7 mild right-sided and  marked left-sided foraminal narrowing.  C7-T1 mild to moderate right-sided and moderate left-sided foraminal narrowing.  T1-2 minimal anterior slip T1.   Mild bilateral foraminal narrowing.  T2-3 minimal right foraminal narrowing.  T3-4 no significant bony foraminal narrowing or spinal stenosis.  T4-5 no significant spinal stenosis or bony neural foraminal narrowing.  Aortic atherosclerosis.  Cbc, cmp unremarkable   Review of Systems: Patient complains of symptoms per HPI as well as the following symptoms: Headache, sleepiness, anxiety aching muscles. Pertinent negatives per HPI. All others negative.   Social History   Social History  . Marital status: Married    Spouse name: Katelyn [name]  . Number of children: 2  . Years of education: College   Occupational History  . Retired    Social History Main Topics  . Smoking status: Never Smoker  . Smokeless tobacco: Never Used  . Alcohol use 4.2 oz/week    7 Glasses of wine per week     Comment: 1-2 rare  . Drug use: No  . Sexual activity: Yes   Other Topics Concern  . Not on file   Social History Narrative   Lives with husband   Caffeine use: none    Family History  Problem Relation Age of Onset  . Colon polyps Mother   . Arthritis Mother   . Hearing loss Mother   . Hyperlipidemia Mother   . Hypertension Mother   . Stroke Mother   . Colon polyps Father   . Arthritis Father   . Heart disease Father   . Hyperlipidemia Father   . Colon cancer Neg Hx   . Pancreatic cancer Neg Hx   . Stomach cancer Neg Hx     Past Medical History:  Diagnosis Date  . Arthritis    cervical and lumbar DDD  . Hyperlipidemia   . Hypertension   . Osteoporosis     Past Surgical History:  Procedure Laterality Date  . BUNIONECTOMY     right  . CERVICAL FUSION     C5-6-7  . COLONOSCOPY    . POLYPECTOMY    . SPINE SURGERY      Dr. Patrice Paradise (x2, last Nov/15)  . UPPER GASTROINTESTINAL ENDOSCOPY      Current Outpatient Prescriptions  Medication Sig Dispense Refill  . ALPRAZolam (XANAX) 0.5 MG tablet Take 1 tablet (0.5 mg total) by mouth 2 (two) times daily as needed for anxiety.  60 tablet 1  . Ascorbic Acid (VITAMIN C) 500 MG tablet Take 500 mg by mouth daily.      Marland Kitchen aspirin 81 MG tablet Take 81 mg by mouth daily.      . B Complex Vitamins (VITAMIN B COMPLEX PO) Take 1 tablet by mouth daily.      . calcium carbonate (OS-CAL) 600 MG TABS Take 600 mg by mouth 2 (two) times daily with a meal.      . escitalopram (LEXAPRO) 5 MG tablet Take 1 tablet (5 mg total) by mouth at bedtime. 30 tablet 6  . fish oil-omega-3 fatty acids 1000 MG capsule Take 1 g by mouth 2 (two) times daily.      . Garlic 123XX123 MG CAPS Take 1 capsule by mouth daily.      Marland Kitchen glucosamine-chondroitin 500-400 MG tablet Take 1 tablet by mouth daily.      . hydrochlorothiazide (HYDRODIURIL) 25 MG tablet Take 1 tablet (25 mg total) by mouth daily. 90 tablet 3  . Lysine 500 MG CAPS Take 1 capsule by mouth daily.      Marland Kitchen  magnesium oxide (MAG-OX) 400 MG tablet Take 400 mg by mouth daily.      . meloxicam (MOBIC) 15 MG tablet Take 15 mg by mouth daily.    . Multiple Vitamins-Minerals (MULTIVITAMIN WITH MINERALS) tablet Take 1 tablet by mouth daily.      Marland Kitchen POTASSIUM GLUCONATE PO Take 1 tablet by mouth daily.      . propranolol (INDERAL) 20 MG tablet Take 1 tablet (20 mg total) by mouth 2 (two) times daily. 60 tablet 3  . simvastatin (ZOCOR) 20 MG tablet Take 1 tablet (20 mg total) by mouth at bedtime. 90 tablet 3  . SUMAtriptan (IMITREX) 100 MG tablet TAKE 1/2 TABLET(50 MG) BY MOUTH EVERY 2 HOURS AS NEEDED FOR MIGRAINE OR HEADACHE 10 tablet 1  . traMADol (ULTRAM) 50 MG tablet Take 1 tablet (50 mg total) by mouth every 12 (twelve) hours as needed. 60 tablet 1  . vitamin E 400 UNIT capsule Take 400 Units by mouth daily.      Marland Kitchen zolpidem (AMBIEN) 10 MG tablet Take 1 tablet (10 mg total) by mouth at bedtime as needed for sleep. 30 tablet 0   No current facility-administered medications for this visit.     Allergies as of 01/29/2016 - Review Complete 01/29/2016  Allergen Reaction Noted  . Prednisone Hives 10/23/2013    . Hydrocodone Palpitations 10/23/2013  . Red dye Rash 05/31/2014    Vitals: BP (!) 145/91 (BP Location: Right Arm, Patient Position: Sitting, Cuff Size: Normal)   Pulse 68   Ht 5\' 3"  (1.6 m)   Wt 106 lb 9.6 oz (48.4 kg)   BMI 18.88 kg/m  Last Weight:  Wt Readings from Last 1 Encounters:  01/29/16 106 lb 9.6 oz (48.4 kg)   Last Height:   Ht Readings from Last 1 Encounters:  01/29/16 5\' 3"  (1.6 m)   Physical exam: Exam: Gen: NAD, conversant, well nourised, obese, well groomed                     CV: RRR, no MRG. No Carotid Bruits. No peripheral edema, warm, nontender Eyes: Conjunctivae clear without exudates or hemorrhage MSK: hypertrophied cervical musculature, anterior shift of the head, left laterocollis, right torticollis, severely decreased ROM of the neck,elevated shoulders left > right.   Neuro: Detailed Neurologic Exam  Speech:    Speech is normal; fluent and spontaneous with normal comprehension.  Cognition:    The patient is oriented to person, place, and time;     recent and remote memory intact;     language fluent;     normal attention, concentration,     fund of knowledge Cranial Nerves:    The pupils are equal, round, and reactive to light. Attempted fundoscopic exam could not visualze.  Visual fields are full to finger confrontation. Extraocular movements are intact. Trigeminal sensation is intact and the muscles of mastication are normal. The face is symmetric. The palate elevates in the midline. Hearing intact. Voice is normal. Shoulder shrug is normal. The tongue has normal motion without fasciculations.   Coordination:    Normal finger to nose and heel to shin. Normal rapid alternating movements.   Gait:    Heel-toe and tandem gait are normal.   Motor Observation:    No asymmetry, no atrophy, and no involuntary movements noted. Tone:    Normal muscle tone.    Posture:    erect    Strength:    Strength is V/V in the upper and lower  limbs.       Sensation: intact to LT     Reflex Exam:  DTR's:    Deep tendon reflexes in the upper and lower extremities are symmetrical bilaterally.   Toes:    The toes are downgoing bilaterally.   Clonus:    Clonus is absent.       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 She has tried multiple medications. Muscle relaxers, analgesic, opioids, massage,physical therapy, acupuncture. Tightness in the cerb=bical musculature, worsening sever pain, decreased ROM. She is a good candidate for boox injections. Could not tolerate medications. Refractory to oral medications. Goal is to decrease pain and increase range of motion. Will request authorization and transfer to dr. Krista Blue for procedure. For dysport.patient has headache, will check a crp and sed rate.  Left trapezius 200 Left levator Scapulae 100 Right Trapezius 100  Requested 500 units dysport  Sarina Ill, MD  [facility] [address] [place], [place] [address]  Phone [phone] Fax [phone]

## 2016-02-02 DIAGNOSIS — G243 Spasmodic torticollis: Secondary | ICD-10-CM | POA: Insufficient documentation

## 2016-02-03 ENCOUNTER — Ambulatory Visit (INDEPENDENT_AMBULATORY_CARE_PROVIDER_SITE_OTHER): Payer: Medicare Other | Admitting: Neurology

## 2016-02-03 ENCOUNTER — Encounter: Payer: Self-pay | Admitting: Neurology

## 2016-02-03 VITALS — BP 132/94 | HR 71 | Ht 63.0 in | Wt 104.8 lb

## 2016-02-03 DIAGNOSIS — G243 Spasmodic torticollis: Secondary | ICD-10-CM

## 2016-02-03 DIAGNOSIS — M542 Cervicalgia: Secondary | ICD-10-CM | POA: Diagnosis not present

## 2016-02-03 NOTE — Progress Notes (Signed)
[facility]   HPI:  Katelyn [name] is a 70 y.o. female, accompanied by her husband, is referred by Dr. Jaynee Eagles for evaluation of EMG guided botulism toxin injection for neck pain, abnormal neck posturing. Her primary care physician is Dr. Dennard Schaumann  She had a history of chronic neck pain, multiple cervical decompression surgery by Dr. Patrice Paradise in the past.  The first and second cervical surgery in 2012, 2015 ACDF C3-C7, she presented with neck pain radiating pain to bilateral shoulder, bilateral hands numbness and weakness prior to the surgery.  The surgery only temporarily relieved her neck pain,  She had recurrent severe neck pain, radiating pain to bilateral shoulder, she had 3rd surgery in Oct 2016, posterior approach C4-T3 posterior cervical fusion and laminectomy.  Since the most recent surgery  In October 2016,  she complains of worsening constant upper neck and back pain to the lower incision at the upper thoracic region, like a knife cutting through, pulling sensation through bilateral shoulder.  She does has radiating pain to right shoulder, getting worse after walking, tends to hold her right arm  She was reevaluated by Dr. Patrice Paradise, tried different treatments over the past few months, had two points trigger point injection at bilateral cervical, upper thoracic region,  which will only benefit her for one week.  She also tried physical therapy-did hot help much,  dry needling in June 2017- the worst thing she ever tried, worsening pain since dry needling.   She also tried different medications without help, muscle relaxant, flexeril, Mobic, gabapentin, cymbalta. Tramadol, untolerable's GI side effect,   She is now complaining of constant pain 5 out of 10 bilateral shoulder upper back pain since April 2017, she slept with cold pack, sleep okay most of the time.  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  CAT scan of cervical spine in April 2016: Solid anterior fusion C3-T1, residual multilevel foraminal narrowing despite anterior fusion.   Review of Systems: Patient complains of symptoms per HPI as well as the following symptoms: Headaches, back pain, achy muscles, neck pain, stiffness   Social History   Social History  . Marital status: Married    Spouse name: Katelyn [name]  . Number of children: 2  . Years of education: College   Occupational History  . Retired    Social History Main Topics  . Smoking status: Never Smoker  . Smokeless tobacco: Never Used  . Alcohol use 4.2 oz/week    7 Glasses of wine per week     Comment: 1-2 rare  . Drug use: No  . Sexual activity: Yes   Other Topics Concern  . Not on file   Social History Narrative   Lives with husband   Caffeine use: none    Family History  Problem Relation Age of Onset  . Colon polyps Mother   . Arthritis Mother   . Hearing loss Mother   .  Hyperlipidemia Mother   . Hypertension Mother   . Stroke Mother   . Colon polyps Father   . Arthritis Father   . Heart disease Father   . Hyperlipidemia Father   . Colon cancer Neg Hx   . Pancreatic cancer Neg Hx   . Stomach cancer Neg Hx     Past Medical History:  Diagnosis Date  . Arthritis    cervical and lumbar DDD  . Hyperlipidemia   . Hypertension   . Osteoporosis     Past  Surgical History:  Procedure Laterality Date  . BUNIONECTOMY     right  . CERVICAL FUSION     C5-6-7  . COLONOSCOPY    . POLYPECTOMY    . SPINE SURGERY      Dr. Patrice Paradise (x2, last Nov/15)  . UPPER GASTROINTESTINAL ENDOSCOPY      Current Outpatient Prescriptions  Medication Sig Dispense Refill  . ALPRAZolam (XANAX) 0.5 MG tablet Take 1 tablet (0.5 mg total) by mouth 2 (two) times daily as needed for anxiety. 60 tablet 1  . Ascorbic Acid (VITAMIN C) 500 MG tablet Take 500 mg by mouth daily.      Marland Kitchen aspirin 81 MG tablet Take 81 mg by mouth daily.      . B Complex Vitamins (VITAMIN B COMPLEX PO) Take 1 tablet by mouth daily.      . calcium carbonate (OS-CAL) 600 MG TABS Take 600 mg by mouth 2 (two) times daily with a meal.      . escitalopram (LEXAPRO) 5 MG tablet Take 1 tablet (5 mg total) by mouth at bedtime. 30 tablet 6  . fish oil-omega-3 fatty acids 1000 MG capsule Take 1 g by mouth 2 (two) times daily.      . Garlic 123XX123 MG CAPS Take 1 capsule by mouth daily.      Marland Kitchen glucosamine-chondroitin 500-400 MG tablet Take 1 tablet by mouth daily.      . hydrochlorothiazide (HYDRODIURIL) 25 MG tablet Take 1 tablet (25 mg total) by mouth daily. 90 tablet 3  . Lysine 500 MG CAPS Take 1 capsule by mouth daily.      . magnesium oxide (MAG-OX) 400 MG tablet Take 400 mg by mouth daily.      . meloxicam (MOBIC) 15 MG tablet Take 15 mg by mouth daily.    . Multiple Vitamins-Minerals (MULTIVITAMIN WITH MINERALS) tablet Take 1 tablet by mouth daily.      Marland Kitchen POTASSIUM GLUCONATE PO Take 1 tablet by mouth daily.      . propranolol (INDERAL) 20 MG tablet Take 1 tablet (20 mg total) by mouth 2 (two) times daily. 60 tablet 3  . simvastatin (ZOCOR) 20 MG tablet Take 1 tablet (20 mg total) by mouth at bedtime. 90 tablet 3  . SUMAtriptan (IMITREX) 100 MG tablet TAKE 1/2 TABLET(50 MG) BY MOUTH EVERY 2 HOURS AS NEEDED FOR MIGRAINE OR HEADACHE 10 tablet 1  . traMADol (ULTRAM) 50 MG tablet Take 1 tablet (50 mg total) by  mouth every 12 (twelve) hours as needed. 60 tablet 1  . vitamin E 400 UNIT capsule Take 400 Units by mouth daily.      Marland Kitchen zolpidem (AMBIEN) 10 MG tablet Take 1 tablet (10 mg total) by mouth at bedtime as needed for sleep. 30 tablet 0   No current facility-administered medications for this visit.     Allergies as of 02/03/2016 - Review Complete 02/03/2016  Allergen Reaction Noted  . Prednisone Hives 10/23/2013  .  Hydrocodone Palpitations 10/23/2013  . Red dye Rash 05/31/2014    Vitals: BP (!) 132/94   Pulse 71   Ht 5\' 3"  (1.6 m)   Wt 104 lb 12 oz (47.5 kg)   BMI 18.56 kg/m  Last Weight:  Wt Readings from Last 1 Encounters:  02/03/16 104 lb 12 oz (47.5 kg)   Last Height:   Ht Readings from Last 1 Encounters:  02/03/16 5\' 3"  (1.6 m)    PHYSICAL EXAMNIATION:  Gen: NAD, conversant, well nourised, obese, well groomed                     Cardiovascular: Regular rate rhythm, no peripheral edema, warm, nontender. Eyes: Conjunctivae clear without exudates or hemorrhage Neck: Supple, no carotid bruise. Pulmonary: Clear to auscultation bilaterally   NEUROLOGICAL EXAM:  MENTAL STATUS:   She has limited range of motion of neck muscles, especially with neck extension, she tends to stay at left tilt, with anterior shift of neck muscles, tightness and tenderness of bilateral cervical paraspinal muscles  Speech:    Speech is normal; fluent and spontaneous with normal comprehension.  Cognition:     Orientation to time, place and person     Normal recent and remote memory     Normal Attention span and concentration     Normal Language, naming, repeating,spontaneous speech     Fund of knowledge   CRANIAL NERVES: CN II: Visual fields are full to confrontation. Fundoscopic exam is normal with sharp discs and no vascular changes. Pupils are round equal and briskly reactive to light. CN III, IV, VI: extraocular movement are normal. No ptosis. CN V: Facial sensation is intact to pinprick  in all 3 divisions bilaterally. Corneal responses are intact.  CN VII: Face is symmetric with normal eye closure and smile. CN VIII: Hearing is normal to rubbing fingers CN IX, X: Palate elevates symmetrically. Phonation is normal. CN XI: Head turning and shoulder shrug are intact CN XII: Tongue is midline with normal movements and no atrophy.  MOTOR: There is no pronator drift of out-stretched arms. Muscle bulk and tone are normal. Muscle strength is normal.  REFLEXES: Reflexes are 2+ and symmetric at the biceps, triceps, knees, and ankles. Plantar responses are flexor.  SENSORY: Intact to light touch, pinprick, positional and vibratory sensation are intact in fingers and toes.  COORDINATION: Rapid alternating movements and fine finger movements are intact. There is no dysmetria on finger-to-nose and heel-knee-shin.    GAIT/STANCE: Posture is normal. Gait is steady with normal steps, base, arm swing, and turning. Heel and toe walking are normal. Tandem gait is normal.  Romberg is absent.   Assessment/Plan:  Cervical dystonia Chronic neck pain History of anterior C3-C7 fusion, posterior C4-T3 fusion  She has tried and failed different treatment in the past, including thermotherapy, physical therapy, trigger point injection, dry needling, different medications, Flexeril, Cymbalta, gabapentin, NSAIDs,   After discussed with patient, we will proceed with EMG guided botulism toxin injection, finished preauthorization paperwork, I asked for Xeomin 500 units, will only used 200 units during initial injection in one month,   start tizanidine 2 mg 3 times a day  Encourage her hot compression, hot tub, massage therapy, neck stretching exercise   Marcial Pacas, M.D. Ph.D.  [facility] [place], [place] [address] Phone: [phone] Fax:      [phone]

## 2016-02-06 ENCOUNTER — Ambulatory Visit: Payer: Self-pay | Admitting: Neurology

## 2016-02-07 DIAGNOSIS — M791 Myalgia: Secondary | ICD-10-CM | POA: Diagnosis not present

## 2016-02-07 DIAGNOSIS — M542 Cervicalgia: Secondary | ICD-10-CM | POA: Diagnosis not present

## 2016-02-07 DIAGNOSIS — M47816 Spondylosis without myelopathy or radiculopathy, lumbar region: Secondary | ICD-10-CM | POA: Diagnosis not present

## 2016-02-10 ENCOUNTER — Telehealth: Payer: Self-pay | Admitting: Neurology

## 2016-02-10 NOTE — Telephone Encounter (Signed)
The pt has called back, she is concerned that an appt has not been scheduled for botox. She was told when she checked out that a note would be sent to [facility] to start the process.  Pt said she "is walking around in pain and if I don't hear back I will continue to call".  The pt is aware Andee Poles is scheduling Botox as well as MRI scheduling also.

## 2016-02-10 NOTE — Telephone Encounter (Signed)
Pt returned call and was told of practice protocol per [facility] tele note. I reiterated to the pt that Daniell will make sure she is scheduled appropriately and it does take time for medications to be delivered and for insurance to approve. Pt expressed understanding.

## 2016-02-10 NOTE — Telephone Encounter (Signed)
There is a 3-4 turn around time on botox approvals and medication ordering. This is the standard protocol in our practice. I called the patient and left her a VM asking her to return my call so I could schedule her and relay this information to her.

## 2016-02-10 NOTE — Telephone Encounter (Signed)
Pt called in to schedule a botox appt. As well as her insurance. Please call

## 2016-02-11 NOTE — Telephone Encounter (Signed)
Pt called in to speak with Danielle. I skyped her but she was with a pt at the time. She will call pt back.

## 2016-02-11 NOTE — Telephone Encounter (Signed)
Called patient and scheduled her for injection.

## 2016-02-26 NOTE — Telephone Encounter (Signed)
Returned patients call from earlier today Katelyn [name] skyped me) I informed her of coverage benefits for [employer]!

## 2016-02-27 ENCOUNTER — Ambulatory Visit (INDEPENDENT_AMBULATORY_CARE_PROVIDER_SITE_OTHER): Payer: Medicare Other | Admitting: Neurology

## 2016-02-27 ENCOUNTER — Encounter: Payer: Self-pay | Admitting: *Deleted

## 2016-02-27 ENCOUNTER — Encounter: Payer: Self-pay | Admitting: Neurology

## 2016-02-27 VITALS — BP 128/87 | HR 62 | Ht 63.0 in | Wt 104.0 lb

## 2016-02-27 DIAGNOSIS — G243 Spasmodic torticollis: Secondary | ICD-10-CM | POA: Diagnosis not present

## 2016-02-27 NOTE — Progress Notes (Signed)
[facility]   HPI:  Katelyn [name] is a 70 y.o. female, accompanied by her husband, is referred by Dr. Jaynee Eagles for evaluation of EMG guided botulism toxin injection for neck pain, abnormal neck posturing. Her primary care physician is Dr. Dennard Schaumann  She had a history of chronic neck pain, multiple cervical decompression surgery by Dr. Patrice Paradise in the past.  The first and second cervical surgery in 2012, 2015 ACDF C3-C7, she presented with neck pain radiating pain to bilateral shoulder, bilateral hands numbness and weakness prior to the surgery.  The surgery only temporarily relieved her neck pain,  She had recurrent severe neck pain, radiating pain to bilateral shoulder, she had 3rd surgery in Oct 2016, posterior approach C4-T3 posterior cervical fusion and laminectomy.  Since the most recent surgery  In October 2016,  she complains of worsening constant upper neck and back pain to the lower incision at the upper thoracic region, like a knife cutting through, pulling sensation through bilateral shoulder.  She does has radiating pain to right shoulder, getting worse after walking, tends to hold her right arm  She was reevaluated by Dr. Patrice Paradise, tried different treatments over the past few months, had two points trigger point injection at bilateral cervical, upper thoracic region,  which will only benefit her for one week.  She also tried physical therapy-did hot help much,  dry needling in June 2017- the worst thing she ever tried, worsening pain since dry needling.   She also tried different medications without help, muscle relaxant, flexeril, Mobic, gabapentin, cymbalta. Tramadol, untolerable's GI side effect,   She is now complaining of constant pain 5 out of 10 bilateral shoulder upper back pain since April 2017, she slept with cold pack, sleep okay most of the time.  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  CAT scan of cervical spine in April 2016: Solid anterior fusion C3-T1, residual multilevel foraminal narrowing despite anterior fusion.  Update February 27 2016,  She came in for first EMG guided zooming injection for cervical dystonia, moderate to severe constant posterior neck pain, related questions were answered.   Review of Systems: Patient complains of symptoms per HPI as well as the following symptoms: Headaches, back pain, achy muscles, neck pain, stiffness   Social History   Social History  . Marital status: Married    Spouse name: Katelyn [name]  . Number of children: 2  . Years of education: College   Occupational History  . Retired    Social History Main Topics  . Smoking status: Never Smoker  . Smokeless tobacco: Never Used  . Alcohol use 4.2 oz/week    7 Glasses of wine per week     Comment: 1-2 rare  . Drug use: No  . Sexual activity: Yes   Other Topics Concern  . Not on file   Social History Narrative   Lives with husband   Caffeine use: none  Family History  Problem Relation Age of Onset  . Colon polyps Mother   . Arthritis Mother   . Hearing loss Mother   . Hyperlipidemia Mother   . Hypertension Mother   . Stroke Mother   . Colon polyps Father   . Arthritis Father   . Heart disease Father   . Hyperlipidemia Father   . Colon cancer Neg Hx   . Pancreatic cancer Neg Hx     . Stomach cancer Neg Hx     Past Medical History:  Diagnosis Date  . Arthritis    cervical and lumbar DDD  . Hyperlipidemia   . Hypertension   . Osteoporosis     Past Surgical History:  Procedure Laterality Date  . BUNIONECTOMY     right  . CERVICAL FUSION     C5-6-7  . COLONOSCOPY    . POLYPECTOMY    . SPINE SURGERY      Dr. Patrice Paradise (x2, last Nov/15)  . UPPER GASTROINTESTINAL ENDOSCOPY      Current Outpatient Prescriptions  Medication Sig Dispense Refill  . ALPRAZolam (XANAX) 0.5 MG tablet Take 1 tablet (0.5 mg total) by mouth 2 (two) times daily as needed for anxiety. 60 tablet 1  . Ascorbic Acid (VITAMIN C) 500 MG tablet Take 500 mg by mouth daily.      Marland Kitchen aspirin 81 MG tablet Take 81 mg by mouth daily.      . B Complex Vitamins (VITAMIN B COMPLEX PO) Take 1 tablet by mouth daily.      . calcium carbonate (OS-CAL) 600 MG TABS Take 600 mg by mouth 2 (two) times daily with a meal.      . escitalopram (LEXAPRO) 5 MG tablet Take 1 tablet (5 mg total) by mouth at bedtime. 30 tablet 6  . fish oil-omega-3 fatty acids 1000 MG capsule Take 1 g by mouth 2 (two) times daily.      . Garlic 123XX123 MG CAPS Take 1 capsule by mouth daily.      Marland Kitchen glucosamine-chondroitin 500-400 MG tablet Take 1 tablet by mouth daily.      . hydrochlorothiazide (HYDRODIURIL) 25 MG tablet Take 1 tablet (25 mg total) by mouth daily. 90 tablet 3  . IncobotulinumtoxinA (XEOMIN IM) Inject into the muscle every 3 (three) months.    . Lysine 500 MG CAPS Take 1 capsule by mouth daily.      . magnesium oxide (MAG-OX) 400 MG tablet Take 400 mg by mouth daily.      . meloxicam (MOBIC) 15 MG tablet Take 15 mg by mouth daily.    . Multiple Vitamins-Minerals (MULTIVITAMIN WITH MINERALS) tablet Take 1 tablet by mouth daily.      Marland Kitchen POTASSIUM GLUCONATE PO Take 1 tablet by mouth daily.      . propranolol (INDERAL) 20 MG tablet Take 1 tablet (20 mg total) by mouth 2 (two) times daily. 60 tablet 3  . simvastatin (ZOCOR) 20 MG  tablet Take 1 tablet (20 mg total) by mouth at bedtime. 90 tablet 3  . SUMAtriptan (IMITREX) 100 MG tablet TAKE 1/2 TABLET(50 MG) BY MOUTH EVERY 2 HOURS AS NEEDED FOR MIGRAINE OR HEADACHE 10 tablet 1  . traMADol (ULTRAM) 50 MG tablet Take 1 tablet (50 mg total) by mouth every 12 (twelve) hours as needed. 60 tablet 1  . vitamin E 400 UNIT capsule Take 400 Units by mouth daily.      Marland Kitchen zolpidem (AMBIEN) 10 MG tablet Take 1 tablet (10 mg  total) by mouth at bedtime as needed for sleep. 30 tablet 0   No current facility-administered medications for this visit.     Allergies as of 02/27/2016 - Review Complete 02/27/2016  Allergen Reaction Noted  . Prednisone Hives 10/23/2013  . Hydrocodone Palpitations 10/23/2013  . Red dye Rash 05/31/2014    Vitals: BP 128/87   Pulse 62   Ht 5\' 3"  (1.6 m)   Wt 104 lb (47.2 kg)   BMI 18.42 kg/m  Last Weight:  Wt Readings from Last 1 Encounters:  02/27/16 104 lb (47.2 kg)   Last Height:   Ht Readings from Last 1 Encounters:  02/27/16 5\' 3"  (1.6 m)    PHYSICAL EXAMNIATION:  Gen: NAD, conversant, well nourised, obese, well groomed                     Cardiovascular: Regular rate rhythm, no peripheral edema, warm, nontender. Eyes: Conjunctivae clear without exudates or hemorrhage Neck: Supple, no carotid bruise. Pulmonary: Clear to auscultation bilaterally   NEUROLOGICAL EXAM:  MENTAL STATUS:   She has limited range of motion of neck muscles, especially with neck extension, she tends to stay at left tilt, with anterior shift of neck muscles, tightness and tenderness of bilateral cervical paraspinal muscles  Speech:    Speech is normal; fluent and spontaneous with normal comprehension.  Cognition:     Orientation to time, place and person     Normal recent and remote memory     Normal Attention span and concentration     Normal Language, naming, repeating,spontaneous speech     Fund of knowledge   CRANIAL NERVES: CN II: Visual fields are  full to confrontation. Fundoscopic exam is normal with sharp discs and no vascular changes. Pupils are round equal and briskly reactive to light. CN III, IV, VI: extraocular movement are normal. No ptosis. CN V: Facial sensation is intact to pinprick in all 3 divisions bilaterally. Corneal responses are intact.  CN VII: Face is symmetric with normal eye closure and smile. CN VIII: Hearing is normal to rubbing fingers CN IX, X: Palate elevates symmetrically. Phonation is normal. CN XI: Head turning and shoulder shrug are intact CN XII: Tongue is midline with normal movements and no atrophy.  MOTOR: There is no pronator drift of out-stretched arms. Muscle bulk and tone are normal. Muscle strength is normal.  REFLEXES: Reflexes are 2+ and symmetric at the biceps, triceps, knees, and ankles. Plantar responses are flexor.  SENSORY: Intact to light touch, pinprick, positional and vibratory sensation are intact in fingers and toes.  COORDINATION: Rapid alternating movements and fine finger movements are intact. There is no dysmetria on finger-to-nose and heel-knee-shin.    GAIT/STANCE: Posture is normal. Gait is steady with normal steps, base, arm swing, and turning. Heel and toe walking are normal. Tandem gait is normal.  Romberg is absent.   Assessment/Plan:  Cervical dystonia Chronic neck pain History of anterior C3-C7 fusion, posterior C4-T3 fusion  She has tried and failed different treatment in the past, including thermotherapy, physical therapy, trigger point injection, dry needling, different medications, Flexeril, Cymbalta, gabapentin, NSAIDs,  start tizanidine 2 mg 3 times a day  Encourage her hot compression, hot tub, massage therapy, neck stretching exercise  She had her first EMG guided Xeomin injection on February 27 2016, Used 200 units of xeomin today  Right longissimus capitis 25 units Left longissimus capitis 25 units  Right semispinalis 12.5 units Left  semispinalis 12.5 units  Right  splenius cervix T1-T2 level 25 units Left splenius cervix  T-T2 level 25  Left levator scapulae 25 units Right levator scapular 25 units  Left upper trapezius 12.5 units Right upper trapezius 12.5 units  Patient tolerate the injection well will return to clinic in 3 months for repeat injection, depend on response, may stay on the same dosage xeomin 200 units. She is advised to call me 2 weeks after injection for progress report    Marcial Pacas, M.D. Ph.D.  [facility] [place], [place] [address] Phone: [phone] Fax:      [phone]

## 2016-02-27 NOTE — Progress Notes (Signed)
**  Xeomin 100 units x 2 vials, Lot [id number], Exp 07/2017, [place] [id number], office supply//mck,rn**

## 2016-02-28 DIAGNOSIS — G243 Spasmodic torticollis: Secondary | ICD-10-CM | POA: Diagnosis not present

## 2016-02-28 MED ORDER — INCOBOTULINUMTOXINA 100 UNITS IM SOLR
200.0000 [IU] | INTRAMUSCULAR | Status: DC
  Administered 2016-02-28: 200 [IU] via INTRAMUSCULAR

## 2016-03-05 ENCOUNTER — Telehealth: Payer: Self-pay | Admitting: Neurology

## 2016-03-05 NOTE — Telephone Encounter (Signed)
Spoke to patient - she is still having the same neck pain - no weakness but just stiffness.  She has tried warm compresses and they have been helpful.  She understands Xeomin may take a little longer to work. She will call back next week to let us know how she is feeling.  Dr. Krista Blue aware and also suggested she try ibuprofen.  Pt agreeable.

## 2016-03-05 NOTE — Telephone Encounter (Signed)
Pt called said she is still having a lot of pain. She said this is day 7 since botox and wants to know how long will the pain continue..will it ease off.

## 2016-03-11 ENCOUNTER — Encounter: Payer: Self-pay | Admitting: Family Medicine

## 2016-03-11 ENCOUNTER — Ambulatory Visit (INDEPENDENT_AMBULATORY_CARE_PROVIDER_SITE_OTHER): Payer: Medicare Other | Admitting: Family Medicine

## 2016-03-11 VITALS — BP 124/66 | HR 78 | Temp 98.6°F | Resp 16 | Ht 63.0 in | Wt 105.0 lb

## 2016-03-11 DIAGNOSIS — G243 Spasmodic torticollis: Secondary | ICD-10-CM | POA: Diagnosis not present

## 2016-03-11 DIAGNOSIS — G43009 Migraine without aura, not intractable, without status migrainosus: Secondary | ICD-10-CM

## 2016-03-11 DIAGNOSIS — F411 Generalized anxiety disorder: Secondary | ICD-10-CM

## 2016-03-11 MED ORDER — GABAPENTIN 100 MG PO CAPS: 100.0000 mg | ORAL_CAPSULE | Freq: Two times a day (BID) | ORAL | 3 refills | Status: DC

## 2016-03-11 NOTE — Progress Notes (Signed)
Subjective:    Patient ID: Katelyn [name], female    DOB: Jun 04, 1946, 70 y.o.   MRN: [id number]  Patient presents for Anxiety (reports increased anxiety and heart palpitations) and Neck Pain (states that she had botox in neck a few weeks prior and still has no relief   )  Patient here with continued anxiety chronic neck pain migraines. She was seen by the neurologist had trigger point injections done with Botox-like medication this did not help she still in pain. States her migraines improved for about a week but now they're back. She was very tearful during the entire visit states that she just seems to stress and she does not wonder live her life and pain and anxious all the time. She's had times where palpitations come on her when she gets very revved up she will take a Xanax typically happen this calms her down. She is taking the Lexapro at bedtime. She's taken the propanolol and feels like this helps with her blood pressure. She is now seeing Dr. Basil Dess for her low back pain who is planning to do some type of injection in the lower back to help with her back pain she states she's had this before. He advised her to take gabapentin twice a day she was not sure of this.   Review Of Systems:  GEN- denies fatigue, fever, weight loss,weakness, recent illness HEENT- denies eye drainage, change in vision, nasal discharge, CVS- denies chest pain, palpitations RESP- denies SOB, cough, wheeze ABD- denies N/V, change in stools, abd pain GU- denies dysuria, hematuria, dribbling, incontinence MSK- + joint pain, muscle aches, injury Neuro- denies headache, dizziness, syncope, seizure activity       Objective:    BP 124/66 (BP Location: Left Arm, Patient Position: Sitting, Cuff Size: Normal)   Pulse 78   Temp 98.6 F (37 C) (Oral)   Resp 16   Ht 5\' 3"  (1.6 m)   Wt 105 lb (47.6 kg)   BMI 18.60 kg/m  GEN- NAD, alert and oriented x3 HEENT- PERRL, EOMI, non injected sclera, pink conjunctiva,  MMM, oropharynx clear Neck- Supple, stiff posture ROM CVS- RRR, no murmur RESP-CTAB Psych- anxious appearing, tearful during exam, no SI, good eye contact, no SI  Pulses- Radial, DP- 2+        Assessment & Plan:      Problem List Items Addressed This Visit    Migraines    Continue the propanolol she can continue the use of Imitrex if severe. Typically she uses 50 mg. The gabapentin twice a day may also help with her migraines honestly this is significant anxiety I think she even has a form of PTSD after the complications with her next surgery. I think this is expressing an somatic complaints with her migraines palpitations neck pain and now she is going to be evaluated treated for low back pain which I think is going to cause significant issues as well. I discussed with her I think it would be best that she is seen by a therapist to help her work through things she states that that seems like she is too weak to him on this on her own. Tried to discuss how this can be helpful. She was to try taking gabapentin twice a day  To see if this  helps her pain in her migraines and her lower back I honestly do not think that this is going to help or any other medication is often she is too weary to take  the medications as prescribed.  Approx 20 minutes spent with pt > 50% on couseling for anxiety and medication management      Relevant Medications   gabapentin (NEURONTIN) 100 MG capsule   GAD (generalized anxiety disorder) - Primary   Cervical dystonia    Other Visit Diagnoses   None.     Note: This dictation was prepared with Dragon dictation along with smaller phrase technology. Any transcriptional errors that result from this process are unintentional.

## 2016-03-11 NOTE — Assessment & Plan Note (Signed)
Continue the propanolol she can continue the use of Imitrex if severe. Typically she uses 50 mg. The gabapentin twice a day may also help with her migraines honestly this is significant anxiety I think she even has a form of PTSD after the complications with her next surgery. I think this is expressing an somatic complaints with her migraines palpitations neck pain and now she is going to be evaluated treated for low back pain which I think is going to cause significant issues as well. I discussed with her I think it would be best that she is seen by a therapist to help her work through things she states that that seems like she is too weak to him on this on her own. Tried to discuss how this can be helpful. She was to try taking gabapentin twice a day  To see if this  helps her pain in her migraines and her lower back I honestly do not think that this is going to help or any other medication is often she is too weary to take the medications as prescribed.  Approx 20 minutes spent with pt > 50% on couseling for anxiety and medication management

## 2016-03-11 NOTE — Patient Instructions (Addendum)
Okay to take gabapentin twice a day  Call if not improved in 3 weeks F/U as needed

## 2016-03-12 ENCOUNTER — Encounter: Payer: Self-pay | Admitting: *Deleted

## 2016-03-12 NOTE — Telephone Encounter (Addendum)
Spoke to patient - reports minimal relief in neck pain since getting Xeomin 200 units on 02/27/16.  Says she has been walking daily and after prolonged walking, she has more difficulty lifting her head (says this is due to pain - not weakness).  Warm compresses have been helpful. She notes increased anxiety and headaches.  She is treating both with medications (Xanax and Imitrex).  She has not taken tizanidine 2mg  yet but will try it.  She saw her PCP on 03/11/16 and was placed back on gabapentin 100mg , BID for pain.  She has Tramadol at home but it upsets her stomach.  Her next Xeomin injection is scheduled on 05/28/16.

## 2016-03-12 NOTE — Telephone Encounter (Signed)
Get 100 units xeomin when she comes back for next injections

## 2016-03-12 NOTE — Telephone Encounter (Signed)
Pt called said she is still having HA's and pain in the neck muscles, it has been 14 days since botox. Please call

## 2016-03-19 DIAGNOSIS — M961 Postlaminectomy syndrome, not elsewhere classified: Secondary | ICD-10-CM | POA: Diagnosis not present

## 2016-03-19 DIAGNOSIS — M47816 Spondylosis without myelopathy or radiculopathy, lumbar region: Secondary | ICD-10-CM | POA: Diagnosis not present

## 2016-03-19 DIAGNOSIS — M4322 Fusion of spine, cervical region: Secondary | ICD-10-CM | POA: Diagnosis not present

## 2016-03-19 DIAGNOSIS — M542 Cervicalgia: Secondary | ICD-10-CM | POA: Diagnosis not present

## 2016-03-23 ENCOUNTER — Ambulatory Visit: Payer: Self-pay | Admitting: Neurology

## 2016-03-25 DIAGNOSIS — M47817 Spondylosis without myelopathy or radiculopathy, lumbosacral region: Secondary | ICD-10-CM | POA: Diagnosis not present

## 2016-03-25 DIAGNOSIS — M47816 Spondylosis without myelopathy or radiculopathy, lumbar region: Secondary | ICD-10-CM | POA: Diagnosis not present

## 2016-03-29 ENCOUNTER — Other Ambulatory Visit: Payer: Self-pay | Admitting: Family Medicine

## 2016-03-30 DIAGNOSIS — M791 Myalgia: Secondary | ICD-10-CM | POA: Diagnosis not present

## 2016-03-30 DIAGNOSIS — M542 Cervicalgia: Secondary | ICD-10-CM | POA: Diagnosis not present

## 2016-03-30 DIAGNOSIS — M5481 Occipital neuralgia: Secondary | ICD-10-CM | POA: Diagnosis not present

## 2016-03-30 DIAGNOSIS — M47812 Spondylosis without myelopathy or radiculopathy, cervical region: Secondary | ICD-10-CM | POA: Diagnosis not present

## 2016-03-31 ENCOUNTER — Telehealth: Payer: Self-pay

## 2016-03-31 NOTE — Telephone Encounter (Signed)
Medication refilled per protocol. 

## 2016-03-31 NOTE — Telephone Encounter (Signed)
noted 

## 2016-03-31 NOTE — Telephone Encounter (Signed)
Pt just wanted to let you know her Anxiety was getting better she has been seeing Dr Basil Dess.   Pt states she is still having back pain and headaches but hopes that as she continues  to see with Dr Basil Dess that things will get better.

## 2016-04-13 DIAGNOSIS — M47812 Spondylosis without myelopathy or radiculopathy, cervical region: Secondary | ICD-10-CM | POA: Diagnosis not present

## 2016-04-15 ENCOUNTER — Other Ambulatory Visit: Payer: Self-pay | Admitting: Family Medicine

## 2016-04-15 DIAGNOSIS — Z1231 Encounter for screening mammogram for malignant neoplasm of breast: Secondary | ICD-10-CM | POA: Diagnosis not present

## 2016-04-15 LAB — HM MAMMOGRAPHY

## 2016-04-15 NOTE — Telephone Encounter (Signed)
ok 

## 2016-04-15 NOTE — Telephone Encounter (Signed)
Ok to refill 

## 2016-04-16 ENCOUNTER — Encounter: Payer: Self-pay | Admitting: Family Medicine

## 2016-04-20 ENCOUNTER — Telehealth: Payer: Self-pay | Admitting: Neurology

## 2016-04-20 NOTE — Telephone Encounter (Signed)
Patient called 04/20/16 with continued neck pain and headaches.  Discussed with Dr. Krista Blue - she would like to have Xeomin 200 units to inject at her December visit.

## 2016-04-20 NOTE — Telephone Encounter (Addendum)
Discussed with Dr. Krista Blue - she would like to have Xeomin 200 units to inject at her December visit (cervical dystonia).  Patient agreeable and our Botox coordinator, Neldon Labella, will be notified.

## 2016-04-20 NOTE — Telephone Encounter (Signed)
Patient is having very bad headaches. Is there something else she can take besides SUMAtriptan (IMITREX) 100 MG tablet since she takes it so often? Please call  and discuss.

## 2016-04-20 NOTE — Telephone Encounter (Addendum)
Spoke to patient - she is reporting daily headaches of varying degrees.  She is using sumatriptan 100mg  - taking 0.5 tab at onset and repeating the dose nearly 20 days of the month.  She does not want to stop using sumatriptan because it works well for her but she generally does not have enough to last the whole month.  She has also been taking tizanidine.

## 2016-04-21 NOTE — Telephone Encounter (Signed)
Noted, thank you

## 2016-04-23 DIAGNOSIS — M47812 Spondylosis without myelopathy or radiculopathy, cervical region: Secondary | ICD-10-CM | POA: Diagnosis not present

## 2016-04-23 DIAGNOSIS — M47816 Spondylosis without myelopathy or radiculopathy, lumbar region: Secondary | ICD-10-CM | POA: Diagnosis not present

## 2016-04-23 DIAGNOSIS — G894 Chronic pain syndrome: Secondary | ICD-10-CM | POA: Diagnosis not present

## 2016-04-30 ENCOUNTER — Telehealth: Payer: Self-pay | Admitting: Family Medicine

## 2016-04-30 NOTE — Telephone Encounter (Signed)
Still with severe neck pain and headaches 1 year after cervical surgery.  Wants second opinion from another Chief of Staff.

## 2016-05-01 ENCOUNTER — Other Ambulatory Visit: Payer: Self-pay | Admitting: Family Medicine

## 2016-05-04 NOTE — Telephone Encounter (Signed)
I did speak to patient and told her will need to have all old records from previous providers in order to send second opinion referral.  She will get records sent here to [place].

## 2016-05-20 ENCOUNTER — Other Ambulatory Visit: Payer: Self-pay | Admitting: Family Medicine

## 2016-05-20 NOTE — Telephone Encounter (Signed)
okay

## 2016-05-20 NOTE — Telephone Encounter (Signed)
Ok to refill??      Last refill 04/15/16

## 2016-05-21 ENCOUNTER — Telehealth: Payer: Self-pay | Admitting: Family Medicine

## 2016-05-21 DIAGNOSIS — M545 Low back pain: Secondary | ICD-10-CM

## 2016-05-21 DIAGNOSIS — M542 Cervicalgia: Principal | ICD-10-CM

## 2016-05-21 DIAGNOSIS — G8929 Other chronic pain: Secondary | ICD-10-CM

## 2016-05-21 NOTE — Telephone Encounter (Signed)
Pt wanting to see another Chief of Staff.  Still waiting on old records from previous Psychologist, [facility].  Said she had submitted records request a few weeks ago.

## 2016-05-22 ENCOUNTER — Other Ambulatory Visit: Payer: Self-pay | Admitting: Family Medicine

## 2016-05-28 ENCOUNTER — Ambulatory Visit (INDEPENDENT_AMBULATORY_CARE_PROVIDER_SITE_OTHER): Payer: Medicare Other | Admitting: Neurology

## 2016-05-28 ENCOUNTER — Encounter: Payer: Self-pay | Admitting: Neurology

## 2016-05-28 VITALS — BP 129/82 | HR 78 | Ht 63.0 in | Wt 106.0 lb

## 2016-05-28 DIAGNOSIS — G43009 Migraine without aura, not intractable, without status migrainosus: Secondary | ICD-10-CM

## 2016-05-28 DIAGNOSIS — G243 Spasmodic torticollis: Secondary | ICD-10-CM | POA: Diagnosis not present

## 2016-05-28 NOTE — Progress Notes (Signed)
**  Xeomin 100 units x 2 vials, Lot [id number], Exp 06/2017, [place] [id number], office supply.//mck,rn**

## 2016-05-28 NOTE — Progress Notes (Signed)
[facility]   HPI:  Katelyn [name] is a 70 y.o. female, accompanied by her husband, is referred by Dr. Jaynee Eagles for evaluation of EMG guided botulism toxin injection for neck pain, abnormal neck posturing. Her primary care physician is Dr. Dennard Schaumann  She had a history of chronic neck pain, multiple cervical decompression surgery by Dr. Patrice Paradise in the past.  The first and second cervical surgery in 2012, 2015 ACDF C3-C7, she presented with neck pain radiating pain to bilateral shoulder, bilateral hands numbness and weakness prior to the surgery.  The surgery only temporarily relieved her neck pain,  She had recurrent severe neck pain, radiating pain to bilateral shoulder, she had 3rd surgery in Oct 2016, posterior approach C4-T3 posterior cervical fusion and laminectomy.  Since the most recent surgery  In October 2016,  she complains of worsening constant upper neck and back pain to the lower incision at the upper thoracic region, like a knife cutting through, pulling sensation through bilateral shoulder.  She does has radiating pain to right shoulder, getting worse after walking, tends to hold her right arm  She was reevaluated by Dr. Patrice Paradise, tried different treatments over the past few months, had two points trigger point injection at bilateral cervical, upper thoracic region,  which will only benefit her for one week.  She also tried physical therapy-did hot help much,  dry needling in June 2017- the worst thing she ever tried, worsening pain since dry needling.   She also tried different medications without help, muscle relaxant, flexeril, Mobic, gabapentin, cymbalta. Tramadol, untolerable's GI side effect,   She is now complaining of constant pain 5 out of 10 bilateral shoulder upper back pain since April 2017, she slept with cold pack, sleep okay most of the time.  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  CAT scan of cervical spine in April 2016: Solid anterior fusion C3-T1, residual multilevel foraminal narrowing despite anterior fusion.  Update February 27 2016,  She came in for first EMG guided xeomin injection for cervical dystonia, moderate to severe constant posterior neck pain, related questions were answered.  UPDATE Dec 7th 2017: She complains of constant significant neck pain, also frequent headaches at the right retro-orbital area, previous Botox injection did not help her much,   Review of Systems: Patient complains of symptoms per HPI as well as the following symptoms: Headaches, back pain, achy muscles, neck pain, stiffness   Social History   Social History  . Marital status: Married    Spouse name: [name]  . Number of children: 2  . Years of education: College   Occupational History  . Retired    Social History Main Topics  . Smoking status: Never Smoker  . Smokeless tobacco: Never Used  . Alcohol use 4.2 oz/week    7 Glasses of wine per week     Comment: 1-2 rare  . Drug use: No  .  Sexual activity: Yes   Other Topics Concern  . Not on file   Social History Narrative   Lives with husband   Caffeine use: none    Family History  Problem Relation Age of Onset  . Colon polyps Mother   . Arthritis Mother   . Hearing loss Mother   . Hyperlipidemia Mother   . Hypertension  Mother   . Stroke Mother   . Colon polyps Father   . Arthritis Father   . Heart disease Father   . Hyperlipidemia Father   . Colon cancer Neg Hx   . Pancreatic cancer Neg Hx   . Stomach cancer Neg Hx     Past Medical History:  Diagnosis Date  . Arthritis    cervical and lumbar DDD  . Hyperlipidemia   . Hypertension   . Osteoporosis     Past Surgical History:  Procedure Laterality Date  . BUNIONECTOMY     right  . CERVICAL FUSION     C5-6-7  . COLONOSCOPY    . POLYPECTOMY    . SPINE SURGERY      Dr. Patrice Paradise (x2, last Nov/15)  . UPPER GASTROINTESTINAL ENDOSCOPY      Current Outpatient Prescriptions  Medication Sig Dispense Refill  . ALPRAZolam (XANAX) 0.5 MG tablet Take 1 tablet (0.5 mg total) by mouth 2 (two) times daily as needed for anxiety. 60 tablet 1  . Ascorbic Acid (VITAMIN C) 500 MG tablet Take 500 mg by mouth daily.      Marland Kitchen aspirin 81 MG tablet Take 81 mg by mouth daily.      . B Complex Vitamins (VITAMIN B COMPLEX PO) Take 1 tablet by mouth daily.      . calcium carbonate (OS-CAL) 600 MG TABS Take 600 mg by mouth 2 (two) times daily with a meal.      . cyclobenzaprine (FLEXERIL) 10 MG tablet Take 10 mg by mouth 3 (three) times daily as needed for muscle spasms.    Marland Kitchen escitalopram (LEXAPRO) 5 MG tablet Take 1 tablet (5 mg total) by mouth at bedtime. 30 tablet 6  . fish oil-omega-3 fatty acids 1000 MG capsule Take 1 g by mouth 2 (two) times daily.      . Garlic 123XX123 MG CAPS Take 1 capsule by mouth daily.      Marland Kitchen glucosamine-chondroitin 500-400 MG tablet Take 1 tablet by mouth daily.      . hydrochlorothiazide (HYDRODIURIL) 25 MG tablet TAKE 1 TABLET(25 MG) BY MOUTH DAILY 90 tablet 0  . Lysine 500 MG CAPS Take 1 capsule by mouth daily.      . magnesium oxide (MAG-OX) 400 MG tablet Take 400 mg by mouth daily.      . Multiple Vitamins-Minerals (MULTIVITAMIN WITH MINERALS) tablet Take 1 tablet by mouth daily.      Marland Kitchen POTASSIUM GLUCONATE PO Take 1 tablet by mouth daily.       . propranolol (INDERAL) 20 MG tablet Take 1 tablet (20 mg total) by mouth 2 (two) times daily. 60 tablet 3  . simvastatin (ZOCOR) 20 MG tablet TAKE 1 TABLET(20 MG) BY MOUTH AT BEDTIME 90 tablet 0  . SUMAtriptan (IMITREX) 100 MG tablet TAKE 1/2 TABLET(50 MG) BY MOUTH EVERY 2 HOURS AS NEEDED FOR MIGRAINE OR HEADACHE 10 tablet 0  . traMADol (ULTRAM) 50 MG tablet TAKE 1 TABLET BY MOUTH EVERY 12 HOURS AS NEEDED FOR PAIN 60 tablet 0  . vitamin E 400 UNIT capsule Take 400 Units by mouth  daily.      . zolpidem (AMBIEN) 10 MG tablet Take 1 tablet (10 mg total) by mouth at bedtime as needed for sleep. 30 tablet 0   Current Facility-Administered Medications  Medication Dose Route Frequency Provider Last Rate Last Dose  . incobotulinumtoxinA (XEOMIN) 100 units injection 200 Units  200 Units Intramuscular Q90 days Marcial Pacas, MD   200 Units at 02/28/16 1538    Allergies as of 05/28/2016 - Review Complete 05/28/2016  Allergen Reaction Noted  . Prednisone Hives 10/23/2013  . Hydrocodone Palpitations 10/23/2013  . Red dye Rash 05/31/2014    Vitals: BP 129/82   Pulse 78   Ht 5\' 3"  (1.6 m)   Wt 106 lb (48.1 kg)   BMI 18.78 kg/m  Last Weight:  Wt Readings from Last 1 Encounters:  05/28/16 106 lb (48.1 kg)   Last Height:   Ht Readings from Last 1 Encounters:  05/28/16 5\' 3"  (1.6 m)    PHYSICAL EXAMNIATION:  Gen: NAD, conversant, well nourised, obese, well groomed                     Cardiovascular: Regular rate rhythm, no peripheral edema, warm, nontender. Eyes: Conjunctivae clear without exudates or hemorrhage Neck: Supple, no carotid bruise. Pulmonary: Clear to auscultation bilaterally   NEUROLOGICAL EXAM:  MENTAL STATUS:   She has limited range of motion of neck muscles, especially with neck extension, she tends to stay at left tilt, with anterior shift of neck muscles, tightness and tenderness of bilateral cervical paraspinal muscles  Speech:    Speech is normal; fluent and  spontaneous with normal comprehension.  Cognition:     Orientation to time, place and person     Normal recent and remote memory     Normal Attention span and concentration     Normal Language, naming, repeating,spontaneous speech     Fund of knowledge   CRANIAL NERVES: CN II: Visual fields are full to confrontation. Fundoscopic exam is normal with sharp discs and no vascular changes. Pupils are round equal and briskly reactive to light. CN III, IV, VI: extraocular movement are normal. No ptosis. CN V: Facial sensation is intact to pinprick in all 3 divisions bilaterally. Corneal responses are intact.  CN VII: Face is symmetric with normal eye closure and smile. CN VIII: Hearing is normal to rubbing fingers CN IX, X: Palate elevates symmetrically. Phonation is normal. CN XI: Head turning and shoulder shrug are intact CN XII: Tongue is midline with normal movements and no atrophy.  MOTOR: There is no pronator drift of out-stretched arms. Muscle bulk and tone are normal. Muscle strength is normal.  REFLEXES: Reflexes are 2+ and symmetric at the biceps, triceps, knees, and ankles. Plantar responses are flexor.  SENSORY: Intact to light touch, pinprick, positional and vibratory sensation are intact in fingers and toes.  COORDINATION: Rapid alternating movements and fine finger movements are intact. There is no dysmetria on finger-to-nose and heel-knee-shin.    GAIT/STANCE: Posture is normal. Gait is steady with normal steps, base, arm swing, and turning. Heel and toe walking are normal. Tandem gait is normal.  Romberg is absent.   Assessment/Plan:  Cervical dystonia Chronic neck pain History of anterior C3-C7 fusion, posterior C4-T3 fusion  She has tried and failed different treatment in the past, including thermotherapy, physical therapy, trigger point injection, dry needling, different medications, Flexeril, Cymbalta, gabapentin, NSAIDs,  start tizanidine 2 mg 3 times a  day  Encourage her hot compression, hot  tub, massage therapy, neck stretching exercise  EMG guided Xeomin injection, first injection was  on February 27 2016,Second injection May 28 2016   Used 200 units of xeomin today  Corrugate 10 units  Procerus 5 units  Frontalis 20 units  Right temporalis 20 units  Occipitalis 30 units   Right splenius cervix 15 units  Right longissimus capitis 25 units Left longissimus capitis 25 units  Right semispinalis 12.5 units Left semispinalis 12.5 units  Right splenius capitis 12.5 units Left splenius capitis 12.5 units   Patient tolerate the injection well will return to clinic in 3 months for repeat injection, depend on response, may stay on the same dosage xeomin 200 units. She is advised to call me in 2018 for progress report   Marcial Pacas, M.D. Ph.D.  [facility] [place], [place] [address] Phone: [phone] Fax:      [phone]

## 2016-06-01 ENCOUNTER — Telehealth: Payer: Self-pay | Admitting: Neurology

## 2016-06-01 NOTE — Telephone Encounter (Signed)
Spoke to patient - she is going to pick up her prescription at [facility] in [place] now.  It was sent in by her PCP, Dr, Jenna Luo.

## 2016-06-01 NOTE — Telephone Encounter (Signed)
Patient is calling to get medication cyclobenzaprine (FLEXERIL) 10 MG tablet sent to [facility] in [place]. It ws sent to [facility] in [place] by mistake.

## 2016-06-02 ENCOUNTER — Other Ambulatory Visit: Payer: Self-pay | Admitting: Family Medicine

## 2016-06-04 NOTE — Telephone Encounter (Signed)
Pt has done another record request directly thru [facility]

## 2016-06-04 NOTE — Telephone Encounter (Signed)
Other office has no record of request.  Pt to come again today to sign another request.

## 2016-06-04 NOTE — Telephone Encounter (Signed)
Only have notes up to Jan 2017.  Pt know she has been seen this year after that.  Is going to call Dr Patrice Paradise office to see where those additional notes are.  Wants to be referred to [facility] Dr Ronnald Ramp or Dr Vertell Limber for second opinion

## 2016-06-05 ENCOUNTER — Telehealth: Payer: Self-pay | Admitting: Family Medicine

## 2016-06-05 NOTE — Telephone Encounter (Signed)
Received PA for Flexeril that was given to her on 10/17/15 by Dr. Dennard Schaumann. Called the pharmacy and verified that this was an rx from April. Since then she has seen neurology and per lov note they wanted her to try Tizanidine. Pt was called and informed to call her neurologist to get this filled as per lov flexeril was ineffective. Will cancel rx for flexeril with pharmacy.

## 2016-06-09 ENCOUNTER — Telehealth: Payer: Self-pay | Admitting: Neurology

## 2016-06-09 ENCOUNTER — Other Ambulatory Visit: Payer: Self-pay | Admitting: *Deleted

## 2016-06-09 ENCOUNTER — Encounter: Payer: Self-pay | Admitting: *Deleted

## 2016-06-09 NOTE — Telephone Encounter (Signed)
Patient had Xeomin injection on 05-28-16. After injection she has had pain in her neck and shoulders. She has taken traMADol (ULTRAM) 50 MG tablet and Tizanidine for pain and muscle spasms. Please call and discuss.

## 2016-06-09 NOTE — Telephone Encounter (Signed)
Dr. Krista Blue has offered to add meloxicam 15mg , daily.  Patient states she has a supply of this medication at home from a previous surgery.  She will start using it today.  She will also continue neck stretches, hot compresses and massage.  She will call back with any further concerns.

## 2016-06-12 NOTE — Telephone Encounter (Signed)
Patient is calling because she is still having pain in her neck and shoulders and her face is flushed. Please call and advise.

## 2016-06-12 NOTE — Telephone Encounter (Signed)
Rec'd previous provider notes today.  Have let pt know.  Will send second opinion request over to [facility] today.

## 2016-06-12 NOTE — Telephone Encounter (Signed)
I talked with her, she has constant severe neck pain, she has tried tramadol 50mg  tid prn, mobic did not help her.  Xeomin 200 units injection did not help.  I have suggested tizanidine prn and also dilaudid prn.  She will see Dr. Donald Pore soon.

## 2016-06-17 ENCOUNTER — Other Ambulatory Visit: Payer: Self-pay | Admitting: Family Medicine

## 2016-06-17 NOTE — Telephone Encounter (Signed)
Okay to refill? 

## 2016-06-18 MED ORDER — TRAMADOL HCL 50 MG PO TABS: 50.0000 mg | ORAL_TABLET | Freq: Two times a day (BID) | ORAL | 0 refills | Status: DC | PRN

## 2016-06-18 NOTE — Telephone Encounter (Signed)
Done

## 2016-07-14 DIAGNOSIS — M542 Cervicalgia: Secondary | ICD-10-CM | POA: Diagnosis not present

## 2016-07-14 DIAGNOSIS — I10 Essential (primary) hypertension: Secondary | ICD-10-CM | POA: Diagnosis not present

## 2016-07-15 ENCOUNTER — Other Ambulatory Visit: Payer: Self-pay | Admitting: Family Medicine

## 2016-07-15 ENCOUNTER — Telehealth: Payer: Self-pay | Admitting: *Deleted

## 2016-07-15 MED ORDER — ALPRAZOLAM 0.5 MG PO TABS: 0.5000 mg | ORAL_TABLET | Freq: Two times a day (BID) | ORAL | 1 refills | Status: DC | PRN

## 2016-07-15 MED ORDER — TRAMADOL HCL 50 MG PO TABS: 50.0000 mg | ORAL_TABLET | Freq: Two times a day (BID) | ORAL | 1 refills | Status: DC | PRN

## 2016-07-15 MED ORDER — SUMATRIPTAN SUCCINATE 100 MG PO TABS: ORAL_TABLET | ORAL | 0 refills | Status: DC

## 2016-07-15 NOTE — Telephone Encounter (Signed)
Medication called to pharmacy. 

## 2016-07-15 NOTE — Telephone Encounter (Signed)
LRF Alprazolam 12/31/15 #60 + 1  LRF Tramadol 06/18/16 #60  LRF Imitrex 06/02/16 #10  LOV 03/11/16  OK refill?

## 2016-07-15 NOTE — Telephone Encounter (Signed)
okay

## 2016-07-15 NOTE — Telephone Encounter (Signed)
Received call from patient.   Reports that she is switching pharmacy to [facility] [address].   Requested to have refills on Tramadol Xanax and Imitrex sent to [facility].   Ok to refill?

## 2016-07-16 NOTE — Telephone Encounter (Signed)
Please see prior message.   

## 2016-07-16 NOTE — Telephone Encounter (Signed)
I would forward to Dr. Buelah Manis since she is her primary

## 2016-07-28 DIAGNOSIS — M47816 Spondylosis without myelopathy or radiculopathy, lumbar region: Secondary | ICD-10-CM | POA: Diagnosis not present

## 2016-07-28 DIAGNOSIS — G894 Chronic pain syndrome: Secondary | ICD-10-CM | POA: Diagnosis not present

## 2016-07-28 DIAGNOSIS — M47812 Spondylosis without myelopathy or radiculopathy, cervical region: Secondary | ICD-10-CM | POA: Diagnosis not present

## 2016-08-11 ENCOUNTER — Other Ambulatory Visit: Payer: Self-pay | Admitting: Family Medicine

## 2016-08-11 DIAGNOSIS — M47816 Spondylosis without myelopathy or radiculopathy, lumbar region: Secondary | ICD-10-CM | POA: Diagnosis not present

## 2016-08-11 DIAGNOSIS — M791 Myalgia: Secondary | ICD-10-CM | POA: Diagnosis not present

## 2016-08-11 DIAGNOSIS — M4712 Other spondylosis with myelopathy, cervical region: Secondary | ICD-10-CM | POA: Diagnosis not present

## 2016-08-11 DIAGNOSIS — G894 Chronic pain syndrome: Secondary | ICD-10-CM | POA: Diagnosis not present

## 2016-08-12 ENCOUNTER — Telehealth: Payer: Self-pay | Admitting: Neurology

## 2016-08-12 NOTE — Telephone Encounter (Signed)
Patient does not want to continue injections she states that they are not helping.

## 2016-08-24 ENCOUNTER — Other Ambulatory Visit: Payer: Self-pay | Admitting: Family Medicine

## 2016-08-24 MED ORDER — HYDROCHLOROTHIAZIDE 25 MG PO TABS: ORAL_TABLET | ORAL | 0 refills | Status: DC

## 2016-08-24 MED ORDER — SIMVASTATIN 20 MG PO TABS: ORAL_TABLET | ORAL | 0 refills | Status: DC

## 2016-08-24 NOTE — Addendum Note (Signed)
Addended by: Shary Decamp B on: 08/24/2016 02:59 PM   Modules accepted: Orders

## 2016-08-24 NOTE — Telephone Encounter (Signed)
Medication called/sent to requested pharmacy with note for ov before further refills

## 2016-08-26 ENCOUNTER — Ambulatory Visit: Payer: Medicare Other | Admitting: Neurology

## 2016-08-31 ENCOUNTER — Telehealth: Payer: Self-pay | Admitting: Family Medicine

## 2016-08-31 MED ORDER — ESCITALOPRAM OXALATE 5 MG PO TABS: 5.0000 mg | ORAL_TABLET | Freq: Every day | ORAL | 6 refills | Status: DC

## 2016-08-31 NOTE — Telephone Encounter (Signed)
Prescription sent to pharmacy.

## 2016-08-31 NOTE — Telephone Encounter (Signed)
Pt wants to get lexapro refilled [facility] to [facility] at [address]

## 2016-09-16 ENCOUNTER — Encounter: Payer: Self-pay | Admitting: Internal Medicine

## 2016-09-22 DIAGNOSIS — M542 Cervicalgia: Secondary | ICD-10-CM | POA: Diagnosis not present

## 2016-09-22 DIAGNOSIS — M791 Myalgia: Secondary | ICD-10-CM | POA: Diagnosis not present

## 2016-09-23 ENCOUNTER — Other Ambulatory Visit: Payer: Self-pay | Admitting: Family Medicine

## 2016-09-23 NOTE — Telephone Encounter (Signed)
Medication refilled per protocol. 

## 2016-10-09 ENCOUNTER — Other Ambulatory Visit: Payer: Self-pay | Admitting: Family Medicine

## 2016-10-09 DIAGNOSIS — M5001 Cervical disc disorder with myelopathy,  high cervical region: Secondary | ICD-10-CM | POA: Diagnosis not present

## 2016-10-09 DIAGNOSIS — M542 Cervicalgia: Secondary | ICD-10-CM | POA: Diagnosis not present

## 2016-10-09 DIAGNOSIS — G894 Chronic pain syndrome: Secondary | ICD-10-CM | POA: Diagnosis not present

## 2016-10-09 DIAGNOSIS — M791 Myalgia: Secondary | ICD-10-CM | POA: Diagnosis not present

## 2016-10-19 ENCOUNTER — Encounter: Payer: Self-pay | Admitting: Internal Medicine

## 2016-10-22 DIAGNOSIS — M5032 Other cervical disc degeneration, mid-cervical region, unspecified level: Secondary | ICD-10-CM | POA: Diagnosis not present

## 2016-10-22 DIAGNOSIS — M47816 Spondylosis without myelopathy or radiculopathy, lumbar region: Secondary | ICD-10-CM | POA: Diagnosis not present

## 2016-10-22 DIAGNOSIS — M47812 Spondylosis without myelopathy or radiculopathy, cervical region: Secondary | ICD-10-CM | POA: Diagnosis not present

## 2016-10-22 DIAGNOSIS — M47814 Spondylosis without myelopathy or radiculopathy, thoracic region: Secondary | ICD-10-CM | POA: Diagnosis not present

## 2016-10-27 ENCOUNTER — Ambulatory Visit (INDEPENDENT_AMBULATORY_CARE_PROVIDER_SITE_OTHER): Payer: Medicare Other | Admitting: Family Medicine

## 2016-10-27 ENCOUNTER — Encounter: Payer: Self-pay | Admitting: Family Medicine

## 2016-10-27 ENCOUNTER — Other Ambulatory Visit: Payer: Self-pay | Admitting: Family Medicine

## 2016-10-27 VITALS — BP 142/90 | HR 62 | Temp 97.5°F | Resp 16 | Ht 63.0 in | Wt 105.0 lb

## 2016-10-27 DIAGNOSIS — E7800 Pure hypercholesterolemia, unspecified: Secondary | ICD-10-CM | POA: Diagnosis not present

## 2016-10-27 DIAGNOSIS — Z0000 Encounter for general adult medical examination without abnormal findings: Secondary | ICD-10-CM

## 2016-10-27 DIAGNOSIS — R300 Dysuria: Secondary | ICD-10-CM | POA: Diagnosis not present

## 2016-10-27 DIAGNOSIS — I10 Essential (primary) hypertension: Secondary | ICD-10-CM | POA: Diagnosis not present

## 2016-10-27 LAB — COMPLETE METABOLIC PANEL WITH GFR
ALK PHOS: 64 U/L (ref 33–130)
ALT: 37 U/L — ABNORMAL HIGH (ref 6–29)
AST: 33 U/L (ref 10–35)
Albumin: 4.1 g/dL (ref 3.6–5.1)
BUN: 11 mg/dL (ref 7–25)
CHLORIDE: 104 mmol/L (ref 98–110)
CO2: 27 mmol/L (ref 20–31)
Calcium: 9.7 mg/dL (ref 8.6–10.4)
Creat: 0.69 mg/dL (ref 0.50–0.99)
GFR, EST NON AFRICAN AMERICAN: 89 mL/min (ref >=60)
GFR, Est African American: >89 mL/min (ref >=60)
GLUCOSE: 83 mg/dL (ref 70–99)
POTASSIUM: 4.2 mmol/L (ref 3.5–5.3)
SODIUM: 141 mmol/L (ref 135–146)
Total Bilirubin: 0.5 mg/dL (ref 0.2–1.2)
Total Protein: 6.5 g/dL (ref 6.1–8.1)

## 2016-10-27 LAB — CBC WITH DIFFERENTIAL/PLATELET
BASOS ABS: 57 {cells}/uL (ref 0–200)
Basophils Relative: 1 %
Eosinophils Absolute: 57 cells/uL (ref 15–500)
Eosinophils Relative: 1 %
HCT: 42.3 % (ref 35.0–45.0)
Hemoglobin: 14.1 g/dL (ref 12.0–15.0)
Lymphocytes Relative: 31 %
Lymphs Abs: 1767 cells/uL (ref 850–3900)
MCH: 31.4 pg (ref 27.0–33.0)
MCHC: 33.3 g/dL (ref 32.0–36.0)
MCV: 94.2 fL (ref 80.0–100.0)
MONOS PCT: 11 %
MPV: 9.4 fL (ref 7.5–12.5)
Monocytes Absolute: 627 cells/uL (ref 200–950)
NEUTROS PCT: 56 %
Neutro Abs: 3192 cells/uL (ref 1500–7800)
PLATELETS: 268 10*3/uL (ref 140–400)
RBC: 4.49 MIL/uL (ref 3.80–5.10)
RDW: 14.6 % (ref 11.0–15.0)
WBC: 5.7 10*3/uL (ref 3.8–10.8)

## 2016-10-27 LAB — LIPID PANEL
CHOL/HDL RATIO: 1.5 ratio (ref <5.0)
Cholesterol: 212 mg/dL — ABNORMAL HIGH (ref <200)
HDL: 139 mg/dL (ref >50)
LDL CALC: 61 mg/dL (ref <100)
Triglycerides: 58 mg/dL (ref <150)
VLDL: 12 mg/dL (ref <30)

## 2016-10-27 LAB — URINALYSIS, ROUTINE W REFLEX MICROSCOPIC
BILIRUBIN URINE: NEGATIVE
GLUCOSE, UA: NEGATIVE
HGB URINE DIPSTICK: NEGATIVE
Ketones, ur: NEGATIVE
LEUKOCYTES UA: NEGATIVE
Nitrite: NEGATIVE
PROTEIN: NEGATIVE
Specific Gravity, Urine: 1.015 (ref 1.001–1.035)
pH: 7 (ref 5.0–8.0)

## 2016-10-27 MED ORDER — SUMATRIPTAN SUCCINATE 100 MG PO TABS: ORAL_TABLET | ORAL | 3 refills | Status: DC

## 2016-10-27 MED ORDER — ALPRAZOLAM 0.5 MG PO TABS: 0.5000 mg | ORAL_TABLET | Freq: Two times a day (BID) | ORAL | 2 refills | Status: DC | PRN

## 2016-10-27 MED ORDER — ESCITALOPRAM OXALATE 5 MG PO TABS: 5.0000 mg | ORAL_TABLET | Freq: Every day | ORAL | 3 refills | Status: DC

## 2016-10-27 NOTE — Progress Notes (Signed)
Subjective:    Patient ID: Katelyn [name], female    DOB: 1946/05/13, 71 y.o.   MRN: [id number]  HPI Patient is a very pleasant 71 year old white female who is here today for CPE. Her last colonoscopy was in 2015. She is due this year but she is already scheduled that. Her last mammogram was in October 2017 and was normal. She's not had a Pap smear in quite some time however she is over the age of 65 and she denies any history of abnormal Pap smears in the past and therefore I do not believe is medically indicated. Immunizations are up-to-date as listed below Immunization History  Administered Date(s) Administered  . Pneumococcal Conjugate-13 10/17/2015  . Pneumococcal Polysaccharide-23 09/24/2014  . Zoster 06/21/2013   She is due for a tetanus shot but insurance will not cover this and she first declined that. She is also due for hepatitis C screening. We discussed this, and the patient has no risk factors and therefore declines it. Her last bone density test was 2016 and was significant for osteoporosis in the hip. She is not currently taking any bisphosphonate. However she is having dental work done and therefore starting a bisphosphonate now may not be prudent. Past Medical History:  Diagnosis Date  . Arthritis    cervical and lumbar DDD  . Hyperlipidemia   . Hypertension   . Osteoporosis    Past Surgical History:  Procedure Laterality Date  . BUNIONECTOMY     right  . CERVICAL FUSION     C5-6-7  . COLONOSCOPY    . POLYPECTOMY    . SPINE SURGERY      Dr. Patrice Paradise (x2, last Nov/15)  . UPPER GASTROINTESTINAL ENDOSCOPY     Current Outpatient Prescriptions on File Prior to Visit  Medication Sig Dispense Refill  . Ascorbic Acid (VITAMIN C) 500 MG tablet Take 500 mg by mouth daily.      Marland Kitchen aspirin 81 MG tablet Take 81 mg by mouth daily.      . B Complex Vitamins (VITAMIN B COMPLEX PO) Take 1 tablet by mouth daily.      . calcium carbonate (OS-CAL) 600 MG TABS Take 600 mg by mouth 2  (two) times daily with a meal.      . cyclobenzaprine (FLEXERIL) 10 MG tablet Take 10 mg by mouth 3 (three) times daily as needed for muscle spasms.    . fish oil-omega-3 fatty acids 1000 MG capsule Take 1 g by mouth 2 (two) times daily.      . Garlic 9675 MG CAPS Take 1 capsule by mouth daily.      Marland Kitchen glucosamine-chondroitin 500-400 MG tablet Take 1 tablet by mouth daily.      . hydrochlorothiazide (HYDRODIURIL) 25 MG tablet TAKE 1 TABLET(25 MG) BY MOUTH DAILY 90 tablet 0  . Lysine 500 MG CAPS Take 1 capsule by mouth daily.      . magnesium oxide (MAG-OX) 400 MG tablet Take 400 mg by mouth daily.      . meloxicam (MOBIC) 15 MG tablet Take 15 mg by mouth daily.    . Multiple Vitamins-Minerals (MULTIVITAMIN WITH MINERALS) tablet Take 1 tablet by mouth daily.      Marland Kitchen POTASSIUM GLUCONATE PO Take 1 tablet by mouth daily.      . propranolol (INDERAL) 20 MG tablet TAKE 1 TABLET BY MOUTH TWICE A DAY 60 tablet 0  . simvastatin (ZOCOR) 20 MG tablet TAKE 1 TABLET(20 MG) BY MOUTH AT BEDTIME 90 tablet  0  . traMADol (ULTRAM) 50 MG tablet Take 1 tablet (50 mg total) by mouth every 12 (twelve) hours as needed. for pain 60 tablet 1  . vitamin E 400 UNIT capsule Take 400 Units by mouth daily.      Marland Kitchen zolpidem (AMBIEN) 10 MG tablet Take 1 tablet (10 mg total) by mouth at bedtime as needed for sleep. 30 tablet 0   Current Facility-Administered Medications on File Prior to Visit  Medication Dose Route Frequency Provider Last Rate Last Dose  . incobotulinumtoxinA (XEOMIN) 100 units injection 200 Units  200 Units Intramuscular Q90 days Marcial Pacas, MD   200 Units at 02/28/16 1538   Allergies  Allergen Reactions  . Prednisone Hives  . Hydrocodone Palpitations  . Red Dye Rash   Social History   Social History  . Marital status: Married    Spouse name: [name]  . Number of children: 2  . Years of education: College   Occupational History  . Retired    Social History Main Topics  . Smoking status: Never  Smoker  . Smokeless tobacco: Never Used  . Alcohol use 4.2 oz/week    7 Glasses of wine per week     Comment: 1-2 rare  . Drug use: No  . Sexual activity: Yes   Other Topics Concern  . Not on file   Social History Narrative   Lives with husband   Caffeine use: none   Family History  Problem Relation Age of Onset  . Colon polyps Mother   . Arthritis Mother   . Hearing loss Mother   . Hyperlipidemia Mother   . Hypertension Mother   . Stroke Mother   . Colon polyps Father   . Arthritis Father   . Heart disease Father   . Hyperlipidemia Father   . Colon cancer Neg Hx   . Pancreatic cancer Neg Hx   . Stomach cancer Neg Hx       Review of Systems  All other systems reviewed and are negative.      Objective:   Physical Exam  Constitutional: She is oriented to person, place, and time. She appears well-developed and well-nourished. No distress.  HENT:  Head: Normocephalic and atraumatic.  Right Ear: External ear normal.  Left Ear: External ear normal.  Nose: Nose normal.  Mouth/Throat: Oropharynx is clear and moist. No oropharyngeal exudate.  Eyes: Conjunctivae and EOM are normal. Pupils are equal, round, and reactive to light. Right eye exhibits no discharge. Left eye exhibits no discharge. No scleral icterus.  Neck: Normal range of motion. Neck supple. No JVD present. No tracheal deviation present. No thyromegaly present.  Cardiovascular: Normal rate, regular rhythm and intact distal pulses.  Exam reveals no gallop and no friction rub.   No murmur heard. Pulmonary/Chest: Effort normal and breath sounds normal. No stridor. No respiratory distress. She has no wheezes. She has no rales. She exhibits no tenderness.  Abdominal: Soft. Bowel sounds are normal. She exhibits no distension and no mass. There is no tenderness. There is no rebound and no guarding.  Musculoskeletal: Normal range of motion. She exhibits no edema or tenderness.  Lymphadenopathy:    She has no  cervical adenopathy.  Neurological: She is alert and oriented to person, place, and time. She has normal reflexes. No cranial nerve deficit. She exhibits normal muscle tone. Coordination normal.  Skin: Skin is warm. No rash noted. She is not diaphoretic. No erythema. No pallor.  Psychiatric: She has a normal mood  and affect. Her behavior is normal. Judgment and thought content normal.  Vitals reviewed.         Assessment & Plan:  Burning with urination - Plan: Urinalysis, Routine w reflex microscopic  Essential hypertension - Plan: CBC with Differential/Platelet, COMPLETE METABOLIC PANEL WITH GFR, Lipid panel  Pure hypercholesterolemia - Plan: CBC with Differential/Platelet, COMPLETE METABOLIC PANEL WITH GFR, Lipid panel  Routine general medical examination at a health care facility  Patient's physical exam today is normal.  Mammogram is due in October. Bone density is up-to-date. I did recommend Fosamax 70 mg a week but I like to wait until after her dental work has been completed. She will call me back once she is cleared by the dentist to start a bisphosphonate. Her colonoscopy is already scheduled. She does not require a Pap smear. Immunizations are up-to-date. She defers a tetanus shot and hepatitis C screening. I will check a CBC, CMP, and fasting lipid panel.

## 2016-11-12 ENCOUNTER — Other Ambulatory Visit: Payer: Self-pay | Admitting: Family Medicine

## 2016-11-19 ENCOUNTER — Other Ambulatory Visit: Payer: Self-pay | Admitting: Family Medicine

## 2016-12-08 DIAGNOSIS — M47816 Spondylosis without myelopathy or radiculopathy, lumbar region: Secondary | ICD-10-CM | POA: Diagnosis not present

## 2016-12-17 DIAGNOSIS — M791 Myalgia: Secondary | ICD-10-CM | POA: Diagnosis not present

## 2016-12-17 DIAGNOSIS — M47816 Spondylosis without myelopathy or radiculopathy, lumbar region: Secondary | ICD-10-CM | POA: Diagnosis not present

## 2016-12-17 DIAGNOSIS — M542 Cervicalgia: Secondary | ICD-10-CM | POA: Diagnosis not present

## 2016-12-18 ENCOUNTER — Ambulatory Visit (AMBULATORY_SURGERY_CENTER): Payer: Self-pay | Admitting: *Deleted

## 2016-12-18 VITALS — Ht 63.0 in | Wt 105.4 lb

## 2016-12-18 DIAGNOSIS — Z86010 Personal history of colonic polyps: Secondary | ICD-10-CM

## 2016-12-18 MED ORDER — NA SULFATE-K SULFATE-MG SULF 17.5-3.13-1.6 GM/177ML PO SOLN: 1.0000 | Freq: Once | ORAL | 0 refills | Status: AC

## 2016-12-18 NOTE — Progress Notes (Signed)
Pt denies allergies to eggs or soy products. Denies difficulty with sedation or anesthesia. Denies any diet or weight loss medications. Denies use of supplemental oxygen.  Emmi instructions given for procedure.  

## 2017-01-01 ENCOUNTER — Encounter: Payer: Self-pay | Admitting: Internal Medicine

## 2017-01-01 ENCOUNTER — Ambulatory Visit (AMBULATORY_SURGERY_CENTER): Payer: Medicare Other | Admitting: Internal Medicine

## 2017-01-01 VITALS — BP 128/87 | HR 56 | Temp 98.0°F | Resp 16 | Ht 63.0 in | Wt 105.0 lb

## 2017-01-01 DIAGNOSIS — D123 Benign neoplasm of transverse colon: Secondary | ICD-10-CM | POA: Diagnosis not present

## 2017-01-01 DIAGNOSIS — Z1211 Encounter for screening for malignant neoplasm of colon: Secondary | ICD-10-CM | POA: Diagnosis not present

## 2017-01-01 DIAGNOSIS — D122 Benign neoplasm of ascending colon: Secondary | ICD-10-CM | POA: Diagnosis not present

## 2017-01-01 DIAGNOSIS — D124 Benign neoplasm of descending colon: Secondary | ICD-10-CM | POA: Diagnosis not present

## 2017-01-01 DIAGNOSIS — Z860100 Personal history of colon polyps, unspecified: Secondary | ICD-10-CM

## 2017-01-01 DIAGNOSIS — K635 Polyp of colon: Secondary | ICD-10-CM

## 2017-01-01 MED ORDER — SODIUM CHLORIDE 0.9 % IV SOLN: 500.0000 mL | INTRAVENOUS | Status: DC

## 2017-01-01 NOTE — Progress Notes (Signed)
Called to room to assist during endoscopic procedure.  Patient ID and intended procedure confirmed with present staff. Received instructions for my participation in the procedure from the performing physician.  

## 2017-01-01 NOTE — Op Note (Signed)
[facility] Patient Name: Shawnay Bramel Procedure Date: 01/01/2017 10:43 AM MRN: [id number] Endoscopist: Docia Chuck. Henrene Pastor , MD Age: 71 Referring MD:  Date of Birth: 1945/12/02 Gender: Female Account #: [id number] Procedure:                Colonoscopy, with cold snare polypectomy x 9 Indications:              High risk colon cancer surveillance: Personal                            history of multiple (3 or more) adenomas. Multiple                            adenomas on multiple prior colonoscopies in [place] and [place]. Last examination 2012.                            Father with colon cancer around age 52 Medicines:                Monitored Anesthesia Care Procedure:                Pre-Anesthesia Assessment:                           - Prior to the procedure, a History and Physical                            was performed, and patient medications and                            allergies were reviewed. The patient's tolerance of                            previous anesthesia was also reviewed. The risks                            and benefits of the procedure and the sedation                            options and risks were discussed with the patient.                            All questions were answered, and informed consent                            was obtained. Prior Anticoagulants: The patient has                            taken no previous anticoagulant or antiplatelet                            agents. ASA Grade Assessment: II - A patient with  mild systemic disease. After reviewing the risks                            and benefits, the patient was deemed in                            satisfactory condition to undergo the procedure.                           After obtaining informed consent, the colonoscope                            was passed under direct vision. Throughout the     procedure, the patient's blood pressure, pulse, and                            oxygen saturations were monitored continuously. The                            Colonoscope was introduced through the anus and                            advanced to the the cecum, identified by                            appendiceal orifice and ileocecal valve. The                            ileocecal valve, appendiceal orifice, and rectum                            were photographed. The quality of the bowel                            preparation was adequate to identify polyps. The                            colonoscopy was performed without difficulty. The                            patient tolerated the procedure well. The bowel                            preparation used was SUPREP. Scope In: 10:52:01 AM Scope Out: 11:19:21 AM Scope Withdrawal Time: 0 hours 19 minutes 9 seconds  Total Procedure Duration: 0 hours 27 minutes 20 seconds  Findings:                 Nine polyps were found in the descending colon,                            transverse colon, hepatic flexure and ascending                            colon. The polyps  were 2 to 4 mm in size. These                            polyps were removed with a cold snare. Resection                            and retrieval were complete.                           The exam was otherwise without abnormality on                            direct and retroflexion views. Complications:            No immediate complications. Estimated blood loss:                            None. Estimated Blood Loss:     Estimated blood loss: none. Impression:               - Nine 2 to 4 mm polyps in the descending colon, in                            the transverse colon, at the hepatic flexure and in                            the ascending colon, removed with a cold snare.                            Resected and retrieved.                           - The examination was  otherwise normal on direct                            and retroflexion views. Recommendation:           - Repeat colonoscopy in 3 years for surveillance.                           - Patient has a contact number available for                            emergencies. The signs and symptoms of potential                            delayed complications were discussed with the                            patient. Return to normal activities tomorrow.                            Written discharge instructions were provided to the  patient.                           - Resume previous diet.                           - Continue present medications.                           - Await pathology results. Docia Chuck. Henrene Pastor, MD 01/01/2017 11:26:38 AM This report has been signed electronically.

## 2017-01-01 NOTE — Progress Notes (Signed)
A and O x3. Report to RN. Tolerated MAC anesthesia well.

## 2017-01-01 NOTE — Progress Notes (Signed)
Pt's states no medical or surgical changes since previsit or office visit. 

## 2017-01-01 NOTE — Patient Instructions (Signed)
YOU HAD AN ENDOSCOPIC PROCEDURE TODAY AT THE [facility]:   Refer to the procedure report that was given to you for any specific questions about what was found during the examination.  If the procedure report does not answer your questions, please call your gastroenterologist to clarify.  If you requested that your care partner not be given the details of your procedure findings, then the procedure report has been included in a sealed envelope for you to review at your convenience later.  YOU SHOULD EXPECT: Some feelings of bloating in the abdomen. Passage of more gas than usual.  Walking can help get rid of the air that was put into your GI tract during the procedure and reduce the bloating. If you had a lower endoscopy (such as a colonoscopy or flexible sigmoidoscopy) you may notice spotting of blood in your stool or on the toilet paper. If you underwent a bowel prep for your procedure, you may not have a normal bowel movement for a few days.  Please Note:  You might notice some irritation and congestion in your nose or some drainage.  This is from the oxygen used during your procedure.  There is no need for concern and it should clear up in a day or so.  SYMPTOMS TO REPORT IMMEDIATELY:   Following lower endoscopy (colonoscopy or flexible sigmoidoscopy):  Excessive amounts of blood in the stool  Significant tenderness or worsening of abdominal pains  Swelling of the abdomen that is new, acute  Fever of 100F or higher   For urgent or emergent issues, a gastroenterologist can be reached at any hour by calling [phone].   DIET:  We do recommend a small meal at first, but then you may proceed to your regular diet.  Drink plenty of fluids but you should avoid alcoholic beverages for 24 hours.  ACTIVITY:  You should plan to take it easy for the rest of today and you should NOT DRIVE or use heavy machinery until tomorrow (because of the sedation medicines used during the test).     FOLLOW UP: Our staff will call the number listed on your records the next business day following your procedure to check on you and address any questions or concerns that you may have regarding the information given to you following your procedure. If we do not reach you, we will leave a message.  However, if you are feeling well and you are not experiencing any problems, there is no need to return our call.  We will assume that you have returned to your regular daily activities without incident.  If any biopsies were taken you will be contacted by phone or by letter within the next 1-3 weeks.  Please call us at [phone] if you have not heard about the biopsies in 3 weeks.    SIGNATURES/CONFIDENTIALITY: You and/or your care partner have signed paperwork which will be entered into your electronic medical record.  These signatures attest to the fact that that the information above on your After Visit Summary has been reviewed and is understood.  Full responsibility of the confidentiality of this discharge information lies with you and/or your care-partner.  Polyp information given. 

## 2017-01-04 ENCOUNTER — Telehealth: Payer: Self-pay | Admitting: *Deleted

## 2017-01-04 NOTE — Telephone Encounter (Signed)
  Follow up Call-  Call back number 01/01/2017  Post procedure Call Back phone  # [phone]  Permission to leave phone message Yes  Some recent data might be hidden     Patient questions:  Do you have a fever, pain , or abdominal swelling? No. Pain Score  0 *  Have you tolerated food without any problems? Yes.    Have you been able to return to your normal activities? Yes.    Do you have any questions about your discharge instructions: Diet   No. Medications  No. Follow up visit  No.  Do you have questions or concerns about your Care? No.  Actions: * If pain score is 4 or above: No action needed, pain <4.

## 2017-01-05 ENCOUNTER — Encounter: Payer: Self-pay | Admitting: Internal Medicine

## 2017-01-14 DIAGNOSIS — Z79899 Other long term (current) drug therapy: Secondary | ICD-10-CM | POA: Diagnosis not present

## 2017-01-14 DIAGNOSIS — M542 Cervicalgia: Secondary | ICD-10-CM | POA: Diagnosis not present

## 2017-01-14 DIAGNOSIS — Z79891 Long term (current) use of opiate analgesic: Secondary | ICD-10-CM | POA: Diagnosis not present

## 2017-01-14 DIAGNOSIS — Z681 Body mass index (BMI) 19 or less, adult: Secondary | ICD-10-CM | POA: Diagnosis not present

## 2017-01-14 DIAGNOSIS — G894 Chronic pain syndrome: Secondary | ICD-10-CM | POA: Diagnosis not present

## 2017-01-14 DIAGNOSIS — I10 Essential (primary) hypertension: Secondary | ICD-10-CM | POA: Diagnosis not present

## 2017-02-12 DIAGNOSIS — M4322 Fusion of spine, cervical region: Secondary | ICD-10-CM | POA: Diagnosis not present

## 2017-02-12 DIAGNOSIS — G894 Chronic pain syndrome: Secondary | ICD-10-CM | POA: Diagnosis not present

## 2017-02-12 DIAGNOSIS — Z681 Body mass index (BMI) 19 or less, adult: Secondary | ICD-10-CM | POA: Diagnosis not present

## 2017-02-12 DIAGNOSIS — M542 Cervicalgia: Secondary | ICD-10-CM | POA: Diagnosis not present

## 2017-02-15 ENCOUNTER — Other Ambulatory Visit: Payer: Self-pay | Admitting: Family Medicine

## 2017-02-19 ENCOUNTER — Telehealth: Payer: Self-pay | Admitting: *Deleted

## 2017-02-19 MED ORDER — ESCITALOPRAM OXALATE 5 MG PO TABS: 5.0000 mg | ORAL_TABLET | Freq: Every day | ORAL | 3 refills | Status: DC

## 2017-02-19 NOTE — Telephone Encounter (Signed)
Received call from patient.   Reports that she was seen by Dr. Patrice Paradise and has been started on nortriptyline. States that he thinks while she is on nortriptyline, she should cut back on Leapro.   MD please advise.

## 2017-02-19 NOTE — Telephone Encounter (Signed)
Call placed to patient and patient made aware.  

## 2017-02-19 NOTE — Telephone Encounter (Signed)
She is already on the lowest dose of lexapro which I dont recommend stopping I would ask for an alternative medication, as we like to avoid using both of these medications together

## 2017-03-05 IMAGING — CT CT CERVICAL SPINE W/O CM
3 of 5 series · 14 of 33 positions shown, 17 images · non-contrast
Comparison: 09/26/2014.

CLINICAL DATA: 70-year-old female post 3 prior fusions most recent
March 2015. Neck pain. Decreased range motion. Subsequent
encounter.

EXAM:
CT CERVICAL SPINE WITHOUT CONTRAST
TECHNIQUE: Multidetector CT imaging of the cervical spine was performed without
intravenous contrast. Multiplanar CT image reconstructions were also
generated.

[Series 200: cor · coronal · 0.40mm/px · 3 of 51 slices shown]
[im 11/51  bone]
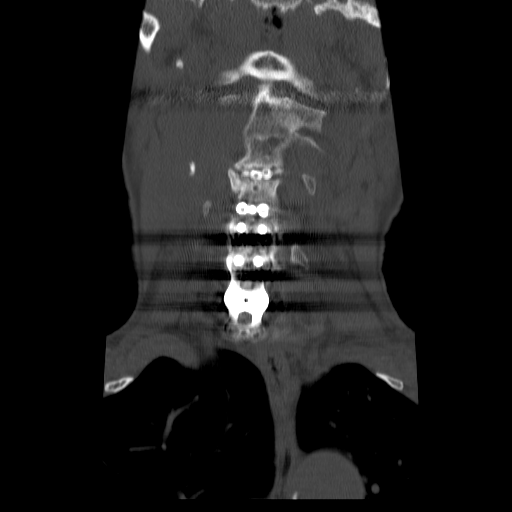
[im 21/51  bone]
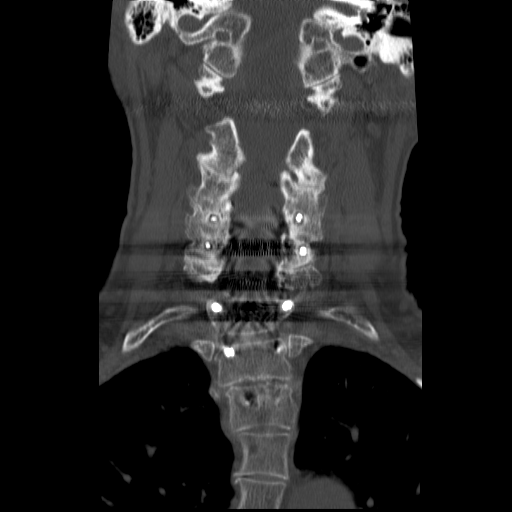
[im 31/51  bone]
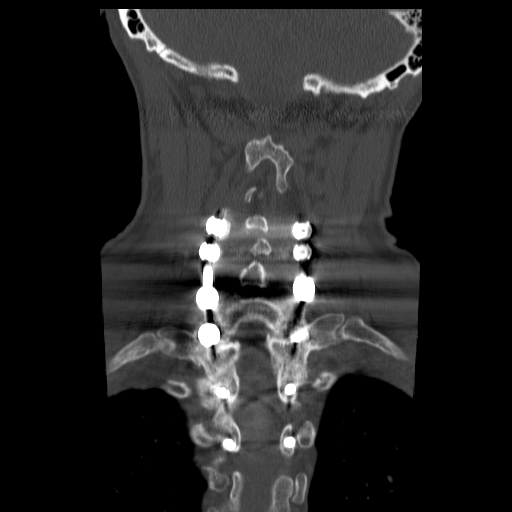

[Series 201: sag · sagittal · 0.40mm/px · 5 of 51 slices shown, 6 images]
[im 17/51  bone]
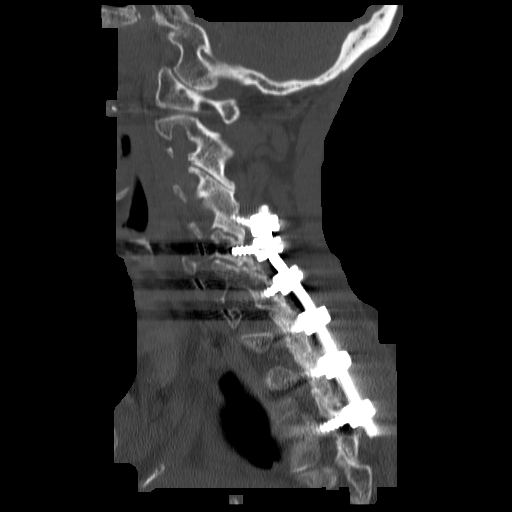
[im 21/51  bone]
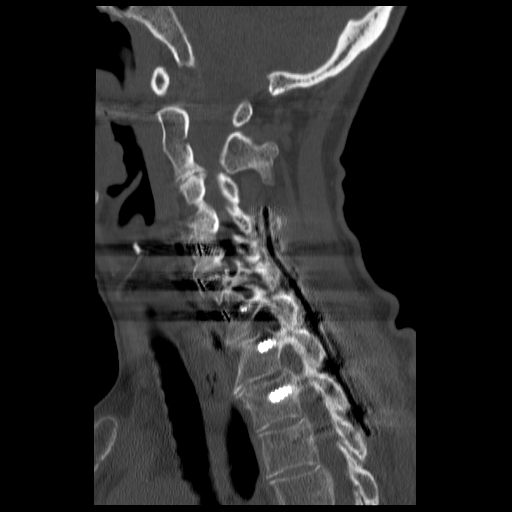
[im 26/51  soft-tissue]
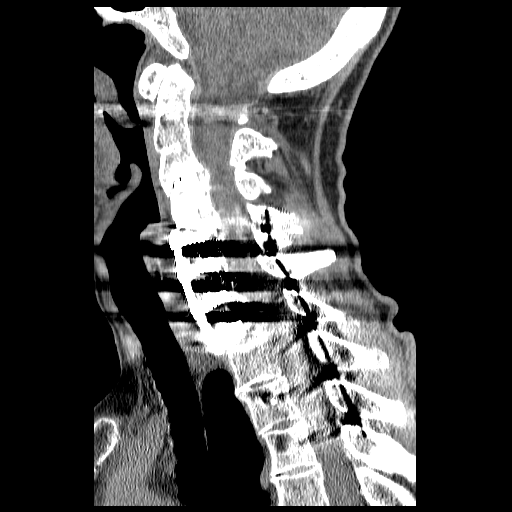
[im 26/51  bone]
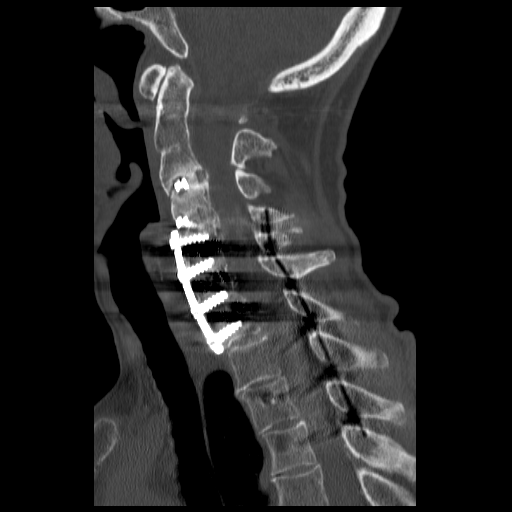
[im 30/51  bone]
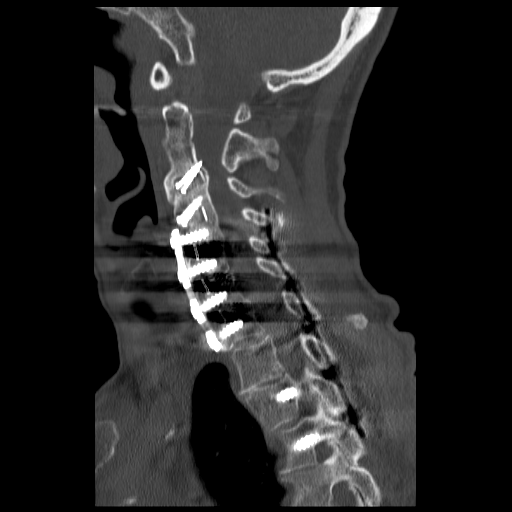
[im 34/51  bone]
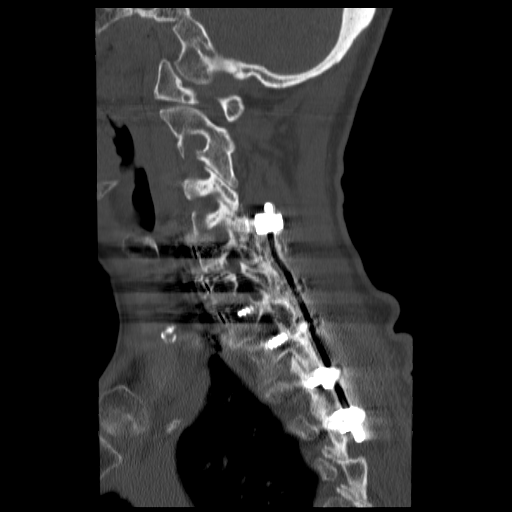

[Series 202: angled axial · axial · 0.20mm/px · z∈[-63,+88]mm · 6 of 301 slices shown, 8 images]
[im 38/301  soft-tissue]
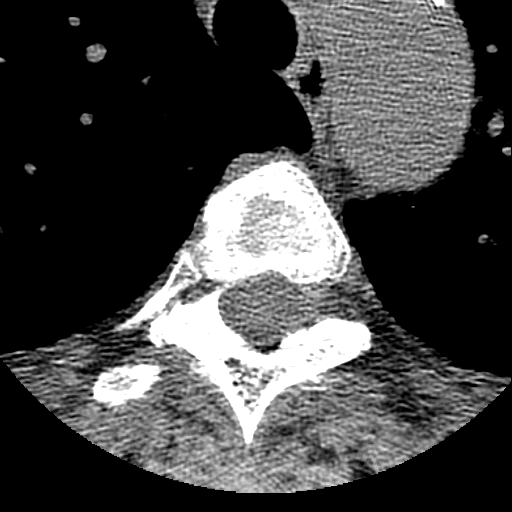
[im 38/301  bone]
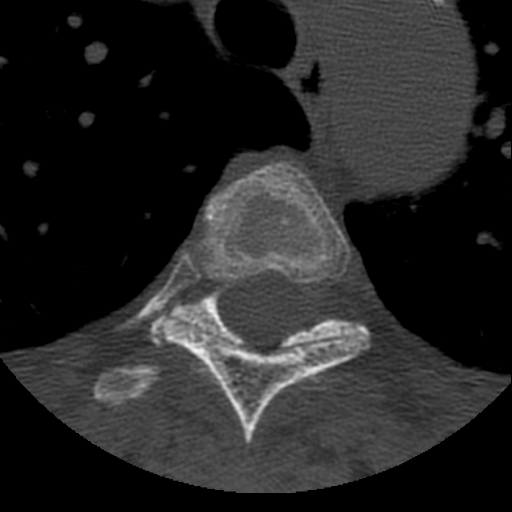
[im 76/301  bone]
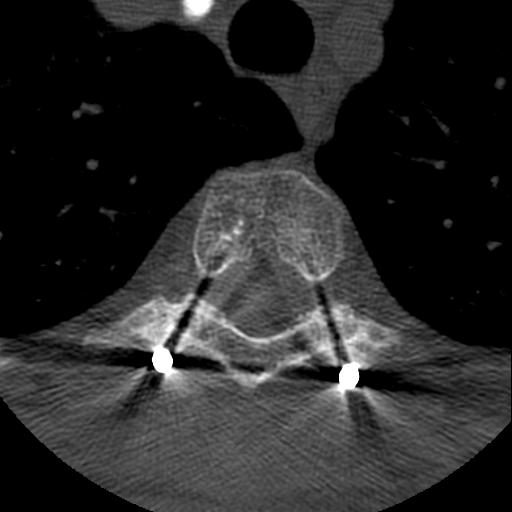
[im 113/301  bone]
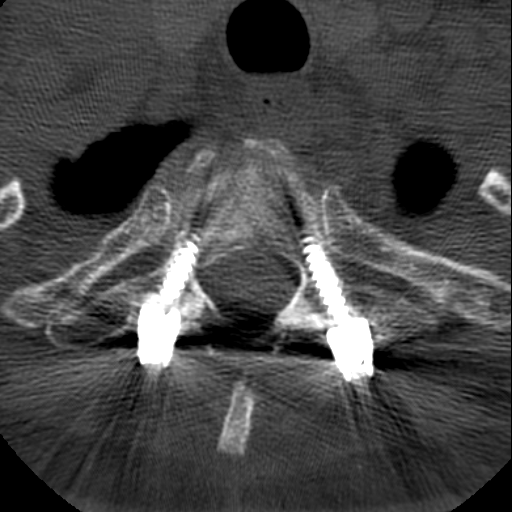
[im 188/301  bone]
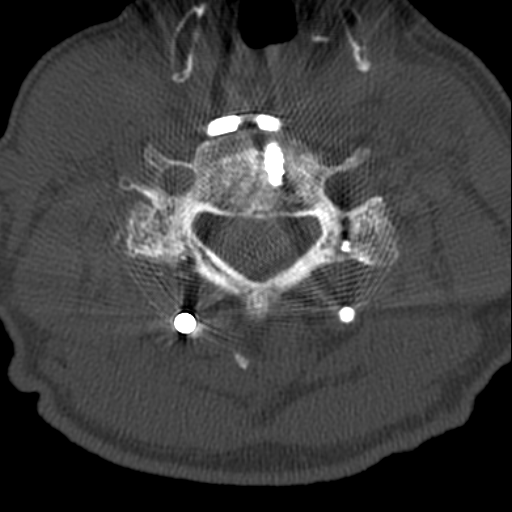
[im 226/301  soft-tissue]
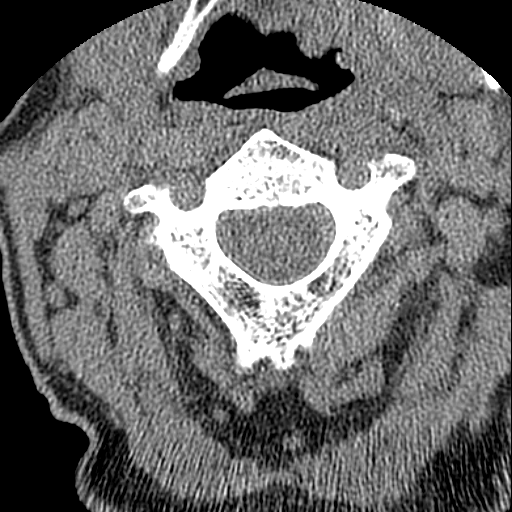
[im 226/301  bone]
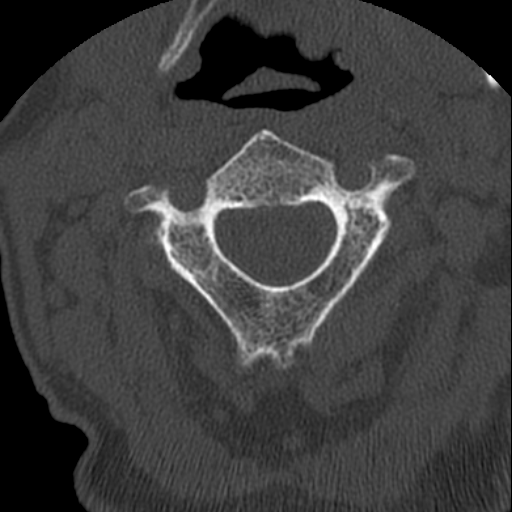
[im 263/301  bone]
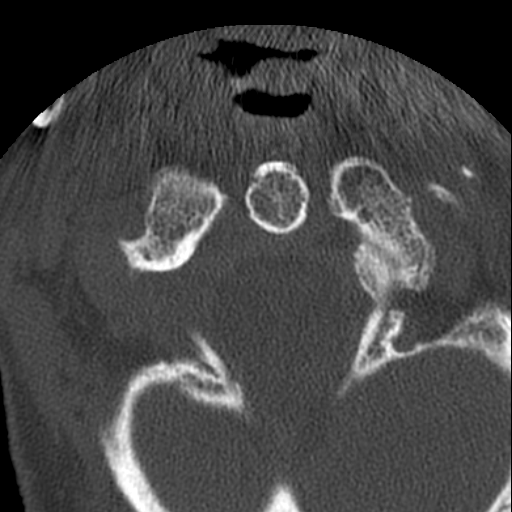

[14 of 33 positions shown; findings below may reference images not displayed]

FINDINGS: Congenital fusion C2-3.

Anterior fusion C5-T1 with anterior plate and screws in place.

C3-4 fusion utilizing screws which traverse through the posterior
cortex and may impinge upon the dura as previously noted and
unchanged. Lucency along the screws similar to prior exam. Small
area fusion across the disc space.

Fusion C4-5 utilizing screws and interbody spacer. Fusion across the
disc space.

New fusion posteriorly with C5 and C6 lateral mass screws. Pedicle
screw T1 through T4. The right T4 pedicle screw partially traverses
inferior cortex of the right T4 pedicle. Posterior connecting bar.

Mild scoliosis upper thoracic spine.

Congenital nonunion posterior ring C1.

Streak artifact limits evaluation.

C2-3:  No significant spinal stenosis.

C3-4: Spur with mild to slightly moderate spinal stenosis. Mild
right-sided and moderate left-sided foraminal narrowing.

C4-5:  No osseous spinal stenosis/ foraminal narrowing.

C5-6:  Mild bilateral foraminal narrowing.

C6-7:  Mild right-sided and marked left-sided foraminal narrowing.

C7-T1: Mild to moderate right-sided and moderate left-sided
foraminal narrowing.

T1-2: Minimal anterior slip T1. Mild bilateral foraminal narrowing.

T2-3:  Minimal right foraminal narrowing.

T3-4:  No significant bony foraminal narrowing or spinal stenosis.

T4-5: No significant spinal stenosis or bony neural foraminal
narrowing.

Coarse 7 mm calcification right lobe of thyroid gland unchanged.

Bilateral carotid bifurcation calcifications. Calcification aortic
arch.

No worrisome mass within visualized lung apices.
IMPRESSION: Congenital fusion C2-3.

Anterior fusion C5-T1 with anterior plate and screws in place.

C3-4 fusion utilizing screws which traverse through the posterior
cortex and may impinge upon the dura as previously noted and
unchanged. Lucency along the screws similar to prior exam. Small
area fusion across the disc space.

Fusion C4-5 utilizing screws and interbody spacer. Fusion across the
disc space.

New fusion posteriorly with C5 and C6 lateral mass screws. Pedicle
screw T1 through T4. The right T4 pedicle screw partially traverses
inferior cortex of the right T4 pedicle. Posterior connecting bar.

Mild scoliosis upper thoracic spine.

Streak artifact limits evaluation.

C2-3 no significant spinal stenosis.

C3-4 spur with mild to slightly moderate spinal stenosis. Mild
right-sided and moderate left-sided foraminal narrowing.

C4-5 no osseous spinal stenosis/ foraminal narrowing.

C5-6 mild bilateral foraminal narrowing.

C6-7 mild right-sided and marked left-sided foraminal narrowing.

C7-T1 mild to moderate right-sided and moderate left-sided foraminal
narrowing.

T1-2 minimal anterior slip T1.  Mild bilateral foraminal narrowing.

T2-3 minimal right foraminal narrowing.

T3-4 no significant bony foraminal narrowing or spinal stenosis.

T4-5 no significant spinal stenosis or bony neural foraminal
narrowing.

Aortic atherosclerosis.

## 2017-03-06 ENCOUNTER — Other Ambulatory Visit: Payer: Self-pay | Admitting: Family Medicine

## 2017-03-09 ENCOUNTER — Other Ambulatory Visit: Payer: Self-pay | Admitting: Family Medicine

## 2017-03-09 NOTE — Telephone Encounter (Signed)
ok 

## 2017-03-09 NOTE — Telephone Encounter (Signed)
Ok to refill 

## 2017-03-10 ENCOUNTER — Other Ambulatory Visit: Payer: Self-pay

## 2017-03-10 MED ORDER — ALPRAZOLAM 0.5 MG PO TABS: ORAL_TABLET | ORAL | 2 refills | Status: DC

## 2017-03-10 NOTE — Telephone Encounter (Signed)
Rx called in to pharmacy. 

## 2017-04-09 DIAGNOSIS — M542 Cervicalgia: Secondary | ICD-10-CM | POA: Diagnosis not present

## 2017-04-09 DIAGNOSIS — G894 Chronic pain syndrome: Secondary | ICD-10-CM | POA: Diagnosis not present

## 2017-04-19 DIAGNOSIS — M810 Age-related osteoporosis without current pathological fracture: Secondary | ICD-10-CM | POA: Diagnosis not present

## 2017-04-19 DIAGNOSIS — Z1231 Encounter for screening mammogram for malignant neoplasm of breast: Secondary | ICD-10-CM | POA: Diagnosis not present

## 2017-04-19 LAB — HM DEXA SCAN

## 2017-04-19 LAB — HM MAMMOGRAPHY

## 2017-04-27 ENCOUNTER — Encounter: Payer: Self-pay | Admitting: Family Medicine

## 2017-07-02 DIAGNOSIS — G894 Chronic pain syndrome: Secondary | ICD-10-CM | POA: Diagnosis not present

## 2017-07-02 DIAGNOSIS — M791 Myalgia, unspecified site: Secondary | ICD-10-CM | POA: Diagnosis not present

## 2017-07-02 DIAGNOSIS — M542 Cervicalgia: Secondary | ICD-10-CM | POA: Diagnosis not present

## 2017-07-15 ENCOUNTER — Other Ambulatory Visit: Payer: Self-pay | Admitting: Family Medicine

## 2017-07-15 NOTE — Telephone Encounter (Signed)
Ok to refill??  Last office visit 03/11/2017.  Last refill 03/10/2017, #2 refills.

## 2017-08-12 ENCOUNTER — Ambulatory Visit (INDEPENDENT_AMBULATORY_CARE_PROVIDER_SITE_OTHER): Payer: Medicare Other | Admitting: Family Medicine

## 2017-08-12 ENCOUNTER — Other Ambulatory Visit: Payer: Self-pay

## 2017-08-12 ENCOUNTER — Encounter: Payer: Self-pay | Admitting: Family Medicine

## 2017-08-12 VITALS — BP 140/82 | HR 82 | Temp 98.1°F | Resp 16 | Ht 63.0 in | Wt 107.0 lb

## 2017-08-12 DIAGNOSIS — G243 Spasmodic torticollis: Secondary | ICD-10-CM | POA: Diagnosis not present

## 2017-08-12 DIAGNOSIS — M542 Cervicalgia: Secondary | ICD-10-CM | POA: Diagnosis not present

## 2017-08-12 DIAGNOSIS — G8929 Other chronic pain: Secondary | ICD-10-CM

## 2017-08-12 DIAGNOSIS — G894 Chronic pain syndrome: Secondary | ICD-10-CM | POA: Diagnosis not present

## 2017-08-12 MED ORDER — ZOLPIDEM TARTRATE 5 MG PO TABS: 5.0000 mg | ORAL_TABLET | Freq: Every evening | ORAL | 0 refills | Status: DC | PRN

## 2017-08-12 MED ORDER — DULOXETINE HCL 30 MG PO CPEP: 30.0000 mg | ORAL_CAPSULE | Freq: Two times a day (BID) | ORAL | 1 refills | Status: DC

## 2017-08-12 NOTE — Progress Notes (Signed)
Subjective:    Patient ID: Katelyn [name], female    DOB: 07-28-1945, 72 y.o.   MRN: [id number]  HPI4/4/16 Patient is a very pleasant 72 year old white female who is here today to establish care. Her only concern is her chronic neck pain. She's had 2 surgeries on her neck. The most recent surgery was performed in November 2015 by Dr. Patrice Paradise.  She is currently using Dilaudid 70 mg by mouth twice a day for pain. She is tried anti-inflammatories in the past, muscle relaxers, Cymbalta, Lyrica, and gabapentin without benefit. 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 It is multilevel degenerative disc disease with the most significant impingement at L4. She recently underwent epidural steroid injection at L4 with minimal benefit. She is scheduled to see her neurosurgeon next week to discuss her next option. Her past medical history is also significant for osteoporosis, hypertension, hyperlipidemia. Her most recent colonoscopy was in 2015. They found 4 polyps. She is due again in 2018. Her mammogram is not due until this summer. She is due for repeat bone density. Her last Pap smear was 2 years ago and is not due again until next year.  At that time, my plan was: Patient's physical exam today is normal. I will schedule the patient for a bone density test. I will also check a CBC, CMP, fasting lipid panel, TSH, and vitamin D level. Patient received Pneumovax 23 today. I will give her Prevnar 70 Year. Her shingles vaccine is up-to-date. We did discuss the tetanus shot but she declined it at the present time.  I will gladly refill her Dilaudid in the future if necessary depending upon what she decides with her orthopedic surgeon.  05/03/15 2 weeks ago, the patient underwent fusion of her cervical spine and her upper thoracic spine under the care of Dr. Patrice Paradise.  She is using Dilaudid 70 mg every 4 hours in addition to baclofen and receiving very little pain relief. However she is very hesitant to  take any stronger medication. She has edema in her neck which appears to be dependent edema. It is equal and symmetric bilaterally. There is no swelling of the jugular vein. There is no JVD. There is no swelling of the face. There is no evidence of superior vena cava syndrome. I believe this is dependent edema due to her inability to move her neck in all and also just healing from her recent surgery. She denies any chest pain shortness of breath or dyspnea on exertion. Her biggest concern is her significant pain.  At that time, my plan was: Continue Dilaudid in addition to baclofen. She would like adding a standing dose anti-inflammatory such as diclofenac 75 mg by mouth twice a day. We'll also try warm compresses to relieve the muscle spasms. However if in one week, the patient experiences no benefit at all, I would be willing to start the patient on a fentanyl patch. I will start the patient a 25 g per 72 hours and increase as needed to provide pain relief. She will call us back next week and let us know how the medication is helping with her pain. I will monitor her kidney function as well as her liver function tests prior to making any changes in her pain medication  10/17/15 I have not seen the patient since that time. She is here today scheduled for a physical. In the interval time she is been seen my partner has been treating her for generalized anxiety disorder using a  combination of Lexapro and Xanax. She recently discontinued Lexapro because she felt that it was making her anxiety worse. She rarely takes Xanax.  She is here today for a physical exam but she is extremely tearful complaining of her chronic neck pain which has been well documented in the chart. She reports a pulling pain from the trapezius down to the inferior portion of her incision on both sides. She reports decreased range of motion and chronic stiffness and spasm. She also reports burning searing pain along the incision that is  unrelenting. At the present time she is taking Celebrex with little or no benefit. She also reports daily intractable migraine headaches. The headaches are located in her occiput as well as above her eyes. They're pounding in nature. They're associated with photophobia and phonophobia. She's had migraine headaches since her 70s. Her last mammogram was in September and she is not due again until September. She is over the age where she needs a hysterectomy here at colonoscopy is up-to-date. She is due for Prevnar 13. Her last bone density was last year and revealed osteoporosis. Recommend Fosamax however he never started the medication because she then underwent neck surgery.  At that time, my plan was: Recommended a mammogram this fall. Colonoscopy is up-to-date. She does not require a Pap smear. I recommended that she start Fosamax for her osteoporosis and recheck a bone density in 2 years. I will start her on propranolol 20 mg by mouth 3 times a day for migraine headaches to see if we can decrease the frequency of her headaches. I am also hopeful that this may help some of her anxiety that she may be having an help address her elevated diastolic blood pressure. I will start her on Flexeril 10 mg every 8 hours as needed for muscle spasms in the neck. I will see her back in 3 weeks to discuss possibly starting her on Topamax for migraine prevention as well as for neuropathic pain in her neck versus trying Lyrica depending upon her results from the medicines I'm giving her today. She received Prevnar 13. I will also check a fasting lipid panel and complete her fasting lab work.  11/07/15 Patient started the Flexeril which is helped her neck substantially. She is taking it 1-2 times a day. It does make her feel discombobulated slightly. She took the propranolol probably one week because she attributed a rapid heartbeat to the propranolol. She is extremely anxious about taking the medication. I explained to the  patient that the propranolol would actually help a rapid heartbeat but she is convinced that the propranolol seem to trigger it. When she was on propranolol however the headache seem better even though she was only taking it for one week. Her blood pressures at home have been ranging between 120 and 137/80-93.  At that time, my plan was: As I explained to the patient, I truly believe this is a situation of "mind over matter". I believe she was convinced that the medications were going to give her side effects and when the Flexeril made her feel sedated, she became anxious triggering PVCs which she attributed to the propranolol. I don't believe the propranolol actually triggered the side effects. As I explained to the patient if anything it should help with the side effects. Since she is now tolerating the Flexeril well with no problems, I urge her to try the propranolol again. I believe this will help with the PVCs. I believe will help with her migraines. I  believe it will help with her mildly elevated blood pressure. And I believe it may even help some with some of the anxiety that she feels by blocking natural adrenaline. She will try again.  08/12/17 Patient is currently under the care of Dr.  Sink. Patrice Paradise for her chronic neck pain.  She has had two surgeries ultimately resulting in a cervical fusion C3-T4.  She continues to have chronic severe unrelenting neck pain.  Has trid opiates in the past including dilaudid and fentanyl which did not help her pain and left her sedated.  She states that she has tried trigger point injects which only exacerbated her pain.  She also has reportedly tried botox injections with similar exacerbation in her pain.  She tried gabapentin 100 tid without relief.  She was started on nortriptyline 30 mg poqhs last year with temporary benefit but this was reduced to 10 mg qhs when cymbalta was added by Dr. Quay Burow last month.  Since adding cymbalta she reports worsening migraine  headaches and also diarrhea.  She is here today asking me to assume her pain management.   Past Medical History:  Diagnosis Date  . Anxiety   . Arthritis    cervical and lumbar DDD  . Hyperlipidemia   . Hypertension   . Osteoporosis    Past Surgical History:  Procedure Laterality Date  . BUNIONECTOMY     right  . CERVICAL FUSION     C5-6-7  . COLONOSCOPY    . POLYPECTOMY    . radiofrequency ablasion    . SPINE SURGERY      Dr. Patrice Paradise (x2, last Nov/15) C3-T4  . UPPER GASTROINTESTINAL ENDOSCOPY     Current Outpatient Medications on File Prior to Visit  Medication Sig Dispense Refill  . ALPRAZolam (XANAX) 0.5 MG tablet TAKE 1 TABLET BY MOUTH TWICE DAILY AS NEEDED FOR ANXIETY 60 tablet 2  . Ascorbic Acid (VITAMIN C) 500 MG tablet Take 500 mg by mouth daily.      Marland Kitchen aspirin 81 MG tablet Take 81 mg by mouth daily.      . B Complex Vitamins (VITAMIN B COMPLEX PO) Take 1 tablet by mouth daily.      . calcium carbonate (OS-CAL) 600 MG TABS Take 600 mg by mouth 2 (two) times daily with a meal.      . diclofenac sodium (VOLTAREN) 1 % GEL Apply 2 g topically 4 (four) times daily.    . fish oil-omega-3 fatty acids 1000 MG capsule Take 1 g by mouth 2 (two) times daily.      . Garlic 5643 MG CAPS Take 1 capsule by mouth daily.      Marland Kitchen glucosamine-chondroitin 500-400 MG tablet Take 1 tablet by mouth daily.      . hydrochlorothiazide (HYDRODIURIL) 25 MG tablet TAKE 1 TABLET(25 MG) BY MOUTH DAILY 90 tablet 2  . Lysine 500 MG CAPS Take 1 capsule by mouth daily.      . magnesium oxide (MAG-OX) 400 MG tablet Take 400 mg by mouth daily.      . Multiple Vitamins-Minerals (MULTIVITAMIN WITH MINERALS) tablet Take 1 tablet by mouth daily.      . nortriptyline (PAMELOR) 10 MG capsule Take 10-20 mg by mouth at bedtime.    Marland Kitchen POTASSIUM GLUCONATE PO Take 1 tablet by mouth daily.      . simvastatin (ZOCOR) 20 MG tablet TAKE 1 TABLET(20 MG) BY MOUTH AT BEDTIME 90 tablet 2  . SUMAtriptan (IMITREX) 100 MG tablet  TAKE 1/2  TABLET BY MOUTH EVERY 2 HOURS AS NEEDED FOR MIGRAINE OR HEADACHE 30 tablet 3  . vitamin E 400 UNIT capsule Take 400 Units by mouth daily.       No current facility-administered medications on file prior to visit.    Allergies  Allergen Reactions  . Iodine   . Prednisone Hives  . Sulfa Antibiotics Swelling  . Hydrocodone Palpitations  . Red Dye Rash   Social History   Socioeconomic History  . Marital status: Married    Spouse name: [name]  . Number of children: 2  . Years of education: College  . Highest education level: Not on file  Social Needs  . Financial resource strain: Not on file  . Food insecurity - worry: Not on file  . Food insecurity - inability: Not on file  . Transportation needs - medical: Not on file  . Transportation needs - non-medical: Not on file  Occupational History  . Occupation: Retired  Tobacco Use  . Smoking status: Never Smoker  . Smokeless tobacco: Never Used  Substance and Sexual Activity  . Alcohol use: Yes    Alcohol/week: 4.2 oz    Types: 7 Glasses of wine per week    Comment: 1-2 rare  . Drug use: No  . Sexual activity: Yes  Other Topics Concern  . Not on file  Social History Narrative   Lives with husband   Caffeine use: none   Family History  Problem Relation Age of Onset  . Colon polyps Mother   . Arthritis Mother   . Hearing loss Mother   . Hyperlipidemia Mother   . Hypertension Mother   . Stroke Mother   . Heart disease Mother   . Colon polyps Father   . Arthritis Father   . Heart disease Father   . Hyperlipidemia Father   . Arthritis Maternal Grandmother   . Arthritis Maternal Grandfather   . Arthritis Paternal Grandmother   . Arthritis Paternal Grandfather   . Colon cancer Neg Hx       Review of Systems  All other systems reviewed and are negative.      Objective:   Physical Exam  Constitutional: She appears well-developed and well-nourished.  HENT:  Head: Normocephalic and atraumatic.    Cardiovascular: Normal rate, regular rhythm and intact distal pulses. Exam reveals no gallop and no friction rub.  No murmur heard. Pulmonary/Chest: Effort normal and breath sounds normal. No respiratory distress. She has no wheezes. She has no rales. She exhibits no tenderness.  Musculoskeletal: She exhibits no edema.       Cervical back: She exhibits decreased range of motion, tenderness, bony tenderness, swelling, pain and spasm.  Skin: Skin is warm and dry. No rash noted. No erythema. No pallor.  Psychiatric: Her speech is normal and behavior is normal. Judgment and thought content normal. Her mood appears anxious. Cognition and memory are normal.  Vitals reviewed.         Assessment & Plan:  Neck pain, chronic  Cervical dystonia  Chronic pain syndrome  In no uncertain terms, I explained to the patient that she needs the assistance of a pain clinic.  She states that she feels that Dr. Quay Burow has nothing left to offer her.  They have mentioned a spinal cord stimulator but I am very worried given her paradoxical reactions to virtually all medications/injections that have been tried.  I believe a large portion of this is psychosomatic vs RSD.  I explained to the patient that  I feel she would benefit from meeting with a psychologist as I feel some of her pain is likely exacerbated and intensified by uncontrolled anxiety.  While consulting a psychologist, I have recommended increasing nortriptyline to 30 mg a day.  Recheck in 3 weeks. Meanwhile continue cymbalta.  I did give her a 1 time rx for ambien 5 mg poqhs prn insomnia to be used on an upcoming cruise and not to be combined with xanax.

## 2017-09-01 ENCOUNTER — Telehealth: Payer: Self-pay | Admitting: Family Medicine

## 2017-09-01 NOTE — Telephone Encounter (Signed)
Patient called [facility] stating since increasing the nortriptyline and cymbalta bid she is doing much better with great relief. She just wanted to let you know.

## 2017-09-06 ENCOUNTER — Ambulatory Visit: Payer: Medicare Other | Admitting: Family Medicine

## 2017-09-08 ENCOUNTER — Other Ambulatory Visit: Payer: Self-pay | Admitting: Family Medicine

## 2017-09-08 MED ORDER — DULOXETINE HCL 30 MG PO CPEP: 30.0000 mg | ORAL_CAPSULE | Freq: Two times a day (BID) | ORAL | 3 refills | Status: AC

## 2017-10-01 DIAGNOSIS — M542 Cervicalgia: Secondary | ICD-10-CM | POA: Diagnosis not present

## 2017-10-01 DIAGNOSIS — M791 Myalgia, unspecified site: Secondary | ICD-10-CM | POA: Diagnosis not present

## 2017-10-01 DIAGNOSIS — G894 Chronic pain syndrome: Secondary | ICD-10-CM | POA: Diagnosis not present

## 2017-10-12 ENCOUNTER — Encounter: Payer: Self-pay | Admitting: Podiatry

## 2017-10-12 ENCOUNTER — Ambulatory Visit (INDEPENDENT_AMBULATORY_CARE_PROVIDER_SITE_OTHER): Payer: Medicare Other | Admitting: Podiatry

## 2017-10-12 ENCOUNTER — Ambulatory Visit (INDEPENDENT_AMBULATORY_CARE_PROVIDER_SITE_OTHER): Payer: Medicare Other

## 2017-10-12 DIAGNOSIS — M722 Plantar fascial fibromatosis: Secondary | ICD-10-CM | POA: Diagnosis not present

## 2017-10-12 DIAGNOSIS — M779 Enthesopathy, unspecified: Secondary | ICD-10-CM

## 2017-10-12 MED ORDER — MELOXICAM 15 MG PO TABS: 15.0000 mg | ORAL_TABLET | Freq: Every day | ORAL | 0 refills | Status: AC

## 2017-10-12 NOTE — Patient Instructions (Signed)

## 2017-10-12 NOTE — Progress Notes (Signed)
Foot

## 2017-10-12 NOTE — Progress Notes (Signed)
Subjective:    Patient ID: Katelyn [name], female    DOB: 1946-02-05, 72 y.o.   MRN: [id number]  HPI 72 year old female presents the office today for concerns of right foot pain mostly to the heel which is been ongoing for a long time.  She states that she is an avid walker.  She states that she had a clicking sensation to her foot at first but that has resolved.  She denies any recent injury or trauma she denies any swelling or redness.  She says it aches when she walks.  She said no recent treatment.  She has no other concerns today.   Review of Systems  All other systems reviewed and are negative.  Past Medical History:  Diagnosis Date  . Anxiety   . Arthritis    cervical and lumbar DDD  . Hyperlipidemia   . Hypertension   . Osteoporosis     Past Surgical History:  Procedure Laterality Date  . BUNIONECTOMY     right  . CERVICAL FUSION     C5-6-7  . COLONOSCOPY    . POLYPECTOMY    . radiofrequency ablasion    . SPINE SURGERY      Dr. Patrice Paradise (x2, last Nov/15) C3-T4  . UPPER GASTROINTESTINAL ENDOSCOPY       Current Outpatient Medications:  .  ALPRAZolam (XANAX) 0.5 MG tablet, TAKE 1 TABLET BY MOUTH TWICE DAILY AS NEEDED FOR ANXIETY, Disp: 60 tablet, Rfl: 2 .  Ascorbic Acid (VITAMIN C) 500 MG tablet, Take 500 mg by mouth daily.  , Disp: , Rfl:  .  aspirin 81 MG tablet, Take 81 mg by mouth daily.  , Disp: , Rfl:  .  B Complex Vitamins (VITAMIN B COMPLEX PO), Take 1 tablet by mouth daily.  , Disp: , Rfl:  .  calcium carbonate (OS-CAL) 600 MG TABS, Take 600 mg by mouth 2 (two) times daily with a meal.  , Disp: , Rfl:  .  diclofenac sodium (VOLTAREN) 1 % GEL, Apply 2 g topically 4 (four) times daily., Disp: , Rfl:  .  DULoxetine (CYMBALTA) 30 MG capsule, Take 1 capsule (30 mg total) by mouth 2 (two) times daily., Disp: 180 capsule, Rfl: 3 .  fish oil-omega-3 fatty acids 1000 MG capsule, Take 1 g by mouth 2 (two) times daily.  , Disp: , Rfl:  .  Garlic 6948 MG CAPS, Take 1  capsule by mouth daily.  , Disp: , Rfl:  .  glucosamine-chondroitin 500-400 MG tablet, Take 1 tablet by mouth daily.  , Disp: , Rfl:  .  hydrochlorothiazide (HYDRODIURIL) 25 MG tablet, TAKE 1 TABLET(25 MG) BY MOUTH DAILY, Disp: 90 tablet, Rfl: 2 .  Lysine 500 MG CAPS, Take 1 capsule by mouth daily.  , Disp: , Rfl:  .  magnesium oxide (MAG-OX) 400 MG tablet, Take 400 mg by mouth daily.  , Disp: , Rfl:  .  meloxicam (MOBIC) 15 MG tablet, Take 1 tablet (15 mg total) by mouth daily., Disp: 30 tablet, Rfl: 0 .  Multiple Vitamins-Minerals (MULTIVITAMIN WITH MINERALS) tablet, Take 1 tablet by mouth daily.  , Disp: , Rfl:  .  nortriptyline (PAMELOR) 10 MG capsule, Take 10-20 mg by mouth at bedtime., Disp: , Rfl:  .  POTASSIUM GLUCONATE PO, Take 1 tablet by mouth daily.  , Disp: , Rfl:  .  simvastatin (ZOCOR) 20 MG tablet, TAKE 1 TABLET(20 MG) BY MOUTH AT BEDTIME, Disp: 90 tablet, Rfl: 2 .  SUMAtriptan (IMITREX) 100  MG tablet, TAKE 1/2 TABLET BY MOUTH EVERY 2 HOURS AS NEEDED FOR MIGRAINE OR HEADACHE, Disp: 30 tablet, Rfl: 3 .  vitamin E 400 UNIT capsule, Take 400 Units by mouth daily.  , Disp: , Rfl:  .  zolpidem (AMBIEN) 5 MG tablet, Take 1 tablet (5 mg total) by mouth at bedtime as needed for sleep., Disp: 30 tablet, Rfl: 0  Allergies  Allergen Reactions  . Iodine   . Prednisone Hives  . Sulfa Antibiotics Swelling  . Hydrocodone Palpitations  . Red Dye Rash    Social History   Socioeconomic History  . Marital status: Married    Spouse name: [name]  . Number of children: 2  . Years of education: College  . Highest education level: Not on file  Occupational History  . Occupation: Retired  Scientific laboratory technician  . Financial resource strain: Not on file  . Food insecurity:    Worry: Not on file    Inability: Not on file  . Transportation needs:    Medical: Not on file    Non-medical: Not on file  Tobacco Use  . Smoking status: Never Smoker  . Smokeless tobacco: Never Used  Substance and  Sexual Activity  . Alcohol use: Yes    Alcohol/week: 4.2 oz    Types: 7 Glasses of wine per week    Comment: 1-2 rare  . Drug use: No  . Sexual activity: Yes  Lifestyle  . Physical activity:    Days per week: Not on file    Minutes per session: Not on file  . Stress: Not on file  Relationships  . Social connections:    Talks on phone: Not on file    Gets together: Not on file    Attends religious service: Not on file    Active member of club or organization: Not on file    Attends meetings of clubs or organizations: Not on file    Relationship status: Not on file  . Intimate partner violence:    Fear of current or ex partner: Not on file    Emotionally abused: Not on file    Physically abused: Not on file    Forced sexual activity: Not on file  Other Topics Concern  . Not on file  Social History Narrative   Lives with husband   Caffeine use: none         Objective:   Physical Exam General: AAO x3, NAD  Dermatological: Skin is warm, dry and supple bilateral. Nails x 10 are well manicured; remaining integument appears unremarkable at this time. There are no open sores, no preulcerative lesions, no rash or signs of infection present.  Vascular: Dorsalis Pedis artery and Posterior Tibial artery pedal pulses are 2/4 bilateral with immedate capillary fill time. Pedal hair growth present. No varicosities and no lower extremity edema present bilateral. There is no pain with calf compression, swelling, warmth, erythema.   Neruologic: Grossly intact via light touch bilateral. Vibratory intact via tuning fork bilateral. Protective threshold with Semmes Wienstein monofilament intact to all pedal sites bilateral.  Negative Tinel sign  Musculoskeletal:Tenderness to palpation along the plantar medial tubercle of the calcaneus at the insertion of plantar fascia on the right foot. There is no pain along the course of the plantar fascia within the arch of the foot. Plantar fascia appears  to be intact. There is no pain with lateral compression of the calcaneus or pain with vibratory sensation. There is no pain along the course or  insertion of the achilles tendon.  Subjectively there is some mild discomfort to the ankle joint itself but there is no area pinpoint tenderness identified in this area today.  No other areas of tenderness to bilateral lower extremities. Muscular strength 5/5 in all groups tested bilateral.  Gait: Unassisted, Nonantalgic.      Assessment & Plan:  72 year old female with right heel pain, likely plantar fasciitis; ankle joint capsulitis  -Treatment options discussed including all alternatives, risks, and complications -Etiology of symptoms were discussed -X-rays were obtained and reviewed with the patient.  There is no definitive evidence of acute fracture or stress fracture identified. -Steroid injection was performed.  See procedure note below. -Prescribed mobic. Discussed side effects of the medication and directed to stop if any are to occur and call the office.  -Plantar fascial brace dispensed -Discussed stretching, icing exercises daily -Discussed modifications and orthotics -Follow-up in 3 weeks or sooner if needed.  Call with questions or concerns.  Trula Slade DPM

## 2017-10-22 ENCOUNTER — Other Ambulatory Visit: Payer: Self-pay | Admitting: Family Medicine

## 2017-11-02 ENCOUNTER — Ambulatory Visit: Payer: Medicare Other | Admitting: Podiatry

## 2017-11-04 ENCOUNTER — Encounter: Payer: Self-pay | Admitting: Family Medicine

## 2017-11-04 ENCOUNTER — Ambulatory Visit (INDEPENDENT_AMBULATORY_CARE_PROVIDER_SITE_OTHER): Payer: Medicare Other | Admitting: Family Medicine

## 2017-11-04 VITALS — BP 126/72 | HR 64 | Temp 98.4°F | Resp 12 | Ht 63.0 in | Wt 105.0 lb

## 2017-11-04 DIAGNOSIS — M542 Cervicalgia: Secondary | ICD-10-CM

## 2017-11-04 DIAGNOSIS — I10 Essential (primary) hypertension: Secondary | ICD-10-CM

## 2017-11-04 DIAGNOSIS — G8929 Other chronic pain: Secondary | ICD-10-CM

## 2017-11-04 DIAGNOSIS — E7800 Pure hypercholesterolemia, unspecified: Secondary | ICD-10-CM

## 2017-11-04 DIAGNOSIS — Z0000 Encounter for general adult medical examination without abnormal findings: Secondary | ICD-10-CM

## 2017-11-04 DIAGNOSIS — M810 Age-related osteoporosis without current pathological fracture: Secondary | ICD-10-CM | POA: Diagnosis not present

## 2017-11-04 LAB — CBC WITH DIFFERENTIAL/PLATELET
BASOS ABS: 51 {cells}/uL (ref 0–200)
Basophils Relative: 1.1 %
Eosinophils Absolute: 129 cells/uL (ref 15–500)
Eosinophils Relative: 2.8 %
HEMATOCRIT: 39.5 % (ref 35.0–45.0)
HEMOGLOBIN: 13.7 g/dL (ref 11.7–15.5)
LYMPHS ABS: 1780 {cells}/uL (ref 850–3900)
MCH: 31.5 pg (ref 27.0–33.0)
MCHC: 34.7 g/dL (ref 32.0–36.0)
MCV: 90.8 fL (ref 80.0–100.0)
MPV: 10.3 fL (ref 7.5–12.5)
Monocytes Relative: 9.1 %
NEUTROS ABS: 2222 {cells}/uL (ref 1500–7800)
NEUTROS PCT: 48.3 %
Platelets: 270 10*3/uL (ref 140–400)
RBC: 4.35 10*6/uL (ref 3.80–5.10)
RDW: 12.2 % (ref 11.0–15.0)
Total Lymphocyte: 38.7 %
WBC: 4.6 10*3/uL (ref 3.8–10.8)
WBCMIX: 419 {cells}/uL (ref 200–950)

## 2017-11-04 LAB — COMPLETE METABOLIC PANEL WITH GFR
AG RATIO: 2 (calc) (ref 1.0–2.5)
ALBUMIN MSPROF: 4.5 g/dL (ref 3.6–5.1)
ALT: 40 U/L — ABNORMAL HIGH (ref 6–29)
AST: 33 U/L (ref 10–35)
Alkaline phosphatase (APISO): 78 U/L (ref 33–130)
BUN: 9 mg/dL (ref 7–25)
CO2: 34 mmol/L — AB (ref 20–32)
CREATININE: 0.73 mg/dL (ref 0.60–0.93)
Calcium: 10.6 mg/dL — ABNORMAL HIGH (ref 8.6–10.4)
Chloride: 99 mmol/L (ref 98–110)
GFR, Est African American: 97 mL/min/{1.73_m2} (ref >=60)
GFR, Est Non African American: 83 mL/min/{1.73_m2} (ref >=60)
GLOBULIN: 2.2 g/dL (ref 1.9–3.7)
Glucose, Bld: 93 mg/dL (ref 65–99)
POTASSIUM: 5.3 mmol/L (ref 3.5–5.3)
SODIUM: 138 mmol/L (ref 135–146)
Total Bilirubin: 0.5 mg/dL (ref 0.2–1.2)
Total Protein: 6.7 g/dL (ref 6.1–8.1)

## 2017-11-04 LAB — LIPID PANEL
CHOL/HDL RATIO: 1.8 (calc) (ref <5.0)
Cholesterol: 240 mg/dL — ABNORMAL HIGH (ref <200)
HDL: 136 mg/dL (ref >50)
LDL Cholesterol (Calc): 90 mg/dL (calc)
NON-HDL CHOLESTEROL (CALC): 104 mg/dL (ref <130)
Triglycerides: 48 mg/dL (ref <150)

## 2017-11-04 MED ORDER — ALENDRONATE SODIUM 70 MG PO TABS: 70.0000 mg | ORAL_TABLET | ORAL | 11 refills | Status: DC

## 2017-11-04 NOTE — Progress Notes (Signed)
Subjective:    Patient ID: Katelyn [name], female    DOB: 1946/03/26, 72 y.o.   MRN: [id number]  HPI Patient is a very pleasant 72 year old Caucasian female here today for complete physical exam.  Patient had a mammogram in October 2018 although we do not have records in chart.  She states that it was normal.  She is already scheduled her mammogram for October of this year.  Due to her age, she does not require Pap smear.  Her last colonoscopy was in 2018.  A repeat colonoscopy was recommended in 2021 due to the presence of polyps.  She had a bone density test last year which showed a T score of -3.1 in the right hip however she is on no therapy for osteoporosis aside from taking calcium and vitamin D.  Her immunizations are listed below.  She is due for Shingrix as well as a tetanus vaccine.  Otherwise her preventative care is up-to-date.  She denies any falls.  She is currently being treated for anxiety and depression and therefore depression screen was not performed.  She denies any difficulty with memory loss or problems with ADLs. Past Medical History:  Diagnosis Date  . Anxiety   . Arthritis    cervical and lumbar DDD  . Hyperlipidemia   . Hypertension   . Osteoporosis    Past Surgical History:  Procedure Laterality Date  . BUNIONECTOMY     right  . CERVICAL FUSION     C5-6-7  . COLONOSCOPY    . POLYPECTOMY    . radiofrequency ablasion    . SPINE SURGERY      Dr. Patrice Paradise (x2, last Nov/15) C3-T4  . UPPER GASTROINTESTINAL ENDOSCOPY     Current Outpatient Medications on File Prior to Visit  Medication Sig Dispense Refill  . ALPRAZolam (XANAX) 0.5 MG tablet TAKE 1 TABLET BY MOUTH TWICE DAILY AS NEEDED FOR ANXIETY 60 tablet 2  . Ascorbic Acid (VITAMIN C) 500 MG tablet Take 500 mg by mouth daily.      Marland Kitchen aspirin 81 MG tablet Take 81 mg by mouth daily.      . B Complex Vitamins (VITAMIN B COMPLEX PO) Take 1 tablet by mouth daily.      . calcium carbonate (OS-CAL) 600 MG TABS Take 600  mg by mouth 2 (two) times daily with a meal.      . diclofenac sodium (VOLTAREN) 1 % GEL Apply 2 g topically 4 (four) times daily.    . DULoxetine (CYMBALTA) 30 MG capsule Take 1 capsule (30 mg total) by mouth 2 (two) times daily. 180 capsule 3  . fish oil-omega-3 fatty acids 1000 MG capsule Take 1 g by mouth 2 (two) times daily.      . Garlic 5102 MG CAPS Take 1 capsule by mouth daily.      Marland Kitchen glucosamine-chondroitin 500-400 MG tablet Take 1 tablet by mouth daily.      . hydrochlorothiazide (HYDRODIURIL) 25 MG tablet TAKE 1 TABLET(25 MG) BY MOUTH DAILY 90 tablet 2  . Lysine 500 MG CAPS Take 1 capsule by mouth daily.      . magnesium oxide (MAG-OX) 400 MG tablet Take 400 mg by mouth daily.      . meloxicam (MOBIC) 15 MG tablet Take 1 tablet (15 mg total) by mouth daily. 30 tablet 0  . Multiple Vitamins-Minerals (MULTIVITAMIN WITH MINERALS) tablet Take 1 tablet by mouth daily.      . nortriptyline (PAMELOR) 10 MG capsule 10 mg.  2 tab po QAM - 3 tab po QPM    . POTASSIUM GLUCONATE PO Take 1 tablet by mouth daily.      . simvastatin (ZOCOR) 20 MG tablet TAKE 1 TABLET(20 MG) BY MOUTH AT BEDTIME 90 tablet 2  . SUMAtriptan (IMITREX) 100 MG tablet TAKE 1/2 TABLET BY MOUTH EVERY 2 HOURS AS NEEDED FOR MIGRAINE OR HEADACHE 30 tablet 3  . traMADol (ULTRAM) 50 MG tablet Take 50 mg by mouth every 8 (eight) hours as needed. for pain  0  . vitamin E 400 UNIT capsule Take 400 Units by mouth daily.      Marland Kitchen zolpidem (AMBIEN) 5 MG tablet Take 1 tablet (5 mg total) by mouth at bedtime as needed for sleep. 30 tablet 0   No current facility-administered medications on file prior to visit.    Allergies  Allergen Reactions  . Iodine   . Prednisone Hives  . Sulfa Antibiotics Swelling  . Hydrocodone Palpitations  . Red Dye Rash   Social History   Socioeconomic History  . Marital status: Married    Spouse name: [name]  . Number of children: 2  . Years of education: College  . Highest education level: Not on  file  Occupational History  . Occupation: Retired  Scientific laboratory technician  . Financial resource strain: Not on file  . Food insecurity:    Worry: Not on file    Inability: Not on file  . Transportation needs:    Medical: Not on file    Non-medical: Not on file  Tobacco Use  . Smoking status: Never Smoker  . Smokeless tobacco: Never Used  Substance and Sexual Activity  . Alcohol use: Yes    Alcohol/week: 4.2 oz    Types: 7 Glasses of wine per week    Comment: 1-2 rare  . Drug use: No  . Sexual activity: Yes  Lifestyle  . Physical activity:    Days per week: Not on file    Minutes per session: Not on file  . Stress: Not on file  Relationships  . Social connections:    Talks on phone: Not on file    Gets together: Not on file    Attends religious service: Not on file    Active member of club or organization: Not on file    Attends meetings of clubs or organizations: Not on file    Relationship status: Not on file  . Intimate partner violence:    Fear of current or ex partner: Not on file    Emotionally abused: Not on file    Physically abused: Not on file    Forced sexual activity: Not on file  Other Topics Concern  . Not on file  Social History Narrative   Lives with husband   Caffeine use: none   Family History  Problem Relation Age of Onset  . Colon polyps Mother   . Arthritis Mother   . Hearing loss Mother   . Hyperlipidemia Mother   . Hypertension Mother   . Stroke Mother   . Heart disease Mother   . Colon polyps Father   . Arthritis Father   . Heart disease Father   . Hyperlipidemia Father   . Arthritis Maternal Grandmother   . Arthritis Maternal Grandfather   . Arthritis Paternal Grandmother   . Arthritis Paternal Grandfather   . Colon cancer Neg Hx       Review of Systems  All other systems reviewed and are negative.  Objective:   Physical Exam  Constitutional: She is oriented to person, place, and time. She appears well-developed and  well-nourished. No distress.  HENT:  Head: Normocephalic and atraumatic.  Right Ear: External ear normal.  Left Ear: External ear normal.  Nose: Nose normal.  Mouth/Throat: Oropharynx is clear and moist. No oropharyngeal exudate.  Eyes: Pupils are equal, round, and reactive to light. Conjunctivae and EOM are normal. Right eye exhibits no discharge. Left eye exhibits no discharge. No scleral icterus.  Neck: Normal range of motion. Neck supple. No JVD present. No tracheal deviation present. No thyromegaly present.  Cardiovascular: Normal rate, regular rhythm and intact distal pulses. Exam reveals no gallop and no friction rub.  No murmur heard. Pulmonary/Chest: Effort normal and breath sounds normal. No stridor. No respiratory distress. She has no wheezes. She has no rales. She exhibits no tenderness.  Abdominal: Soft. Bowel sounds are normal. She exhibits no distension and no mass. There is no tenderness. There is no rebound and no guarding.  Musculoskeletal: Normal range of motion. She exhibits no edema or tenderness.  Lymphadenopathy:    She has no cervical adenopathy.  Neurological: She is alert and oriented to person, place, and time. She has normal reflexes. No cranial nerve deficit. She exhibits normal muscle tone. Coordination normal.  Skin: Skin is warm. No rash noted. She is not diaphoretic. No erythema. No pallor.  Psychiatric: She has a normal mood and affect. Her behavior is normal. Judgment and thought content normal.  Vitals reviewed.         Assessment & Plan:  Pure hypercholesterolemia - Plan: CBC with Differential/Platelet, COMPLETE METABOLIC PANEL WITH GFR, Lipid panel  Routine general medical examination at a health care facility  Essential hypertension  Neck pain, chronic  Osteoporosis, unspecified osteoporosis type, unspecified pathological fracture presence Physical exam today is normal aside from decreased range of motion in the neck due to her numerous neck  surgeries.  I recommended the shingles vaccine, Shingrix.  I also offer the patient a tetanus shot.  She politely declined them today due to cost.  Mammogram has already been scheduled.  Pap smear is not necessary.  Colonoscopy is up-to-date.  Repeat bone density test in 2021.  Begin Fosamax 70 mg weekly for osteoporosis.  Blood pressure is outstanding.  Check CBC, CMP, fasting lipid panel to monitor management of her hyperlipidemia.

## 2017-11-08 ENCOUNTER — Encounter: Payer: Self-pay | Admitting: *Deleted

## 2017-12-01 ENCOUNTER — Other Ambulatory Visit: Payer: Self-pay | Admitting: Family Medicine

## 2017-12-09 ENCOUNTER — Other Ambulatory Visit: Payer: Self-pay | Admitting: Family Medicine

## 2017-12-09 NOTE — Telephone Encounter (Signed)
Ok to refill??  Last office visit 11/04/2017.  Last refill 07/15/2017, #2 refills.

## 2017-12-27 DIAGNOSIS — H2513 Age-related nuclear cataract, bilateral: Secondary | ICD-10-CM | POA: Diagnosis not present

## 2017-12-31 DIAGNOSIS — G894 Chronic pain syndrome: Secondary | ICD-10-CM | POA: Diagnosis not present

## 2017-12-31 DIAGNOSIS — M542 Cervicalgia: Secondary | ICD-10-CM | POA: Diagnosis not present

## 2017-12-31 DIAGNOSIS — M4322 Fusion of spine, cervical region: Secondary | ICD-10-CM | POA: Diagnosis not present

## 2018-01-23 ENCOUNTER — Other Ambulatory Visit: Payer: Self-pay | Admitting: Family Medicine

## 2018-01-24 NOTE — Telephone Encounter (Signed)
Requesting refill    Ambien  LOV: 11/04/17  LRF:  08/12/17

## 2018-03-28 DIAGNOSIS — M810 Age-related osteoporosis without current pathological fracture: Secondary | ICD-10-CM | POA: Diagnosis not present

## 2018-04-05 DIAGNOSIS — K0889 Other specified disorders of teeth and supporting structures: Secondary | ICD-10-CM | POA: Diagnosis not present

## 2018-04-05 DIAGNOSIS — M810 Age-related osteoporosis without current pathological fracture: Secondary | ICD-10-CM | POA: Diagnosis not present

## 2018-04-05 DIAGNOSIS — M2634 Vertical displacement of fully erupted tooth or teeth: Secondary | ICD-10-CM | POA: Diagnosis not present

## 2018-04-05 DIAGNOSIS — K029 Dental caries, unspecified: Secondary | ICD-10-CM | POA: Diagnosis not present

## 2018-04-05 DIAGNOSIS — B999 Unspecified infectious disease: Secondary | ICD-10-CM | POA: Diagnosis not present

## 2018-04-21 DIAGNOSIS — M810 Age-related osteoporosis without current pathological fracture: Secondary | ICD-10-CM | POA: Diagnosis not present

## 2018-04-21 DIAGNOSIS — B999 Unspecified infectious disease: Secondary | ICD-10-CM | POA: Diagnosis not present

## 2018-04-21 DIAGNOSIS — K0889 Other specified disorders of teeth and supporting structures: Secondary | ICD-10-CM | POA: Diagnosis not present

## 2018-04-21 DIAGNOSIS — K029 Dental caries, unspecified: Secondary | ICD-10-CM | POA: Diagnosis not present

## 2018-04-21 DIAGNOSIS — M2634 Vertical displacement of fully erupted tooth or teeth: Secondary | ICD-10-CM | POA: Diagnosis not present

## 2018-04-26 DIAGNOSIS — M542 Cervicalgia: Secondary | ICD-10-CM | POA: Diagnosis not present

## 2018-04-26 DIAGNOSIS — G894 Chronic pain syndrome: Secondary | ICD-10-CM | POA: Diagnosis not present

## 2018-04-26 DIAGNOSIS — M5001 Cervical disc disorder with myelopathy,  high cervical region: Secondary | ICD-10-CM | POA: Diagnosis not present

## 2018-05-16 DIAGNOSIS — Z1231 Encounter for screening mammogram for malignant neoplasm of breast: Secondary | ICD-10-CM | POA: Diagnosis not present

## 2018-05-16 LAB — HM MAMMOGRAPHY

## 2018-06-23 ENCOUNTER — Encounter: Payer: Self-pay | Admitting: *Deleted

## 2018-07-25 ENCOUNTER — Other Ambulatory Visit: Payer: Self-pay | Admitting: Family Medicine

## 2018-07-26 ENCOUNTER — Other Ambulatory Visit: Payer: Self-pay | Admitting: Family Medicine

## 2018-07-26 NOTE — Telephone Encounter (Signed)
Ok to refill??  Last office visit 11/04/2017.  Last refill 12/09/2017, #2 refills.

## 2018-10-27 ENCOUNTER — Other Ambulatory Visit: Payer: Self-pay | Admitting: Family Medicine

## 2018-12-12 ENCOUNTER — Other Ambulatory Visit: Payer: Self-pay | Admitting: Rehabilitation

## 2018-12-12 DIAGNOSIS — M47816 Spondylosis without myelopathy or radiculopathy, lumbar region: Secondary | ICD-10-CM

## 2018-12-14 ENCOUNTER — Ambulatory Visit
Admission: RE | Admit: 2018-12-14 | Discharge: 2018-12-14 | Disposition: A | Discharge status: Home / Self Care | Payer: Medicare Other | Source: Ambulatory Visit | Attending: Rehabilitation | Admitting: Rehabilitation

## 2018-12-14 DIAGNOSIS — M47816 Spondylosis without myelopathy or radiculopathy, lumbar region: Secondary | ICD-10-CM

## 2019-01-26 ENCOUNTER — Other Ambulatory Visit: Payer: Self-pay | Admitting: Family Medicine

## 2019-01-26 NOTE — Telephone Encounter (Signed)
Ok to refill??  Last office visit 11/04/2017.  Last refill 07/26/2018, #2 refills.  Of note, letter sent to patient to schedule OV.

## 2019-02-02 DIAGNOSIS — Z23 Encounter for immunization: Secondary | ICD-10-CM | POA: Diagnosis not present

## 2019-02-03 ENCOUNTER — Encounter: Payer: Medicare Other | Admitting: Family Medicine

## 2019-02-06 ENCOUNTER — Other Ambulatory Visit: Payer: Self-pay

## 2019-02-07 ENCOUNTER — Other Ambulatory Visit: Payer: Self-pay | Admitting: Family Medicine

## 2019-02-07 ENCOUNTER — Encounter: Payer: Self-pay | Admitting: Family Medicine

## 2019-02-07 ENCOUNTER — Ambulatory Visit (INDEPENDENT_AMBULATORY_CARE_PROVIDER_SITE_OTHER): Payer: Medicare Other | Admitting: Family Medicine

## 2019-02-07 VITALS — BP 120/72 | HR 82 | Temp 98.0°F | Resp 14 | Ht 63.0 in | Wt 105.0 lb

## 2019-02-07 DIAGNOSIS — F411 Generalized anxiety disorder: Secondary | ICD-10-CM

## 2019-02-07 DIAGNOSIS — E7800 Pure hypercholesterolemia, unspecified: Secondary | ICD-10-CM

## 2019-02-07 DIAGNOSIS — M810 Age-related osteoporosis without current pathological fracture: Secondary | ICD-10-CM | POA: Diagnosis not present

## 2019-02-07 DIAGNOSIS — Z1231 Encounter for screening mammogram for malignant neoplasm of breast: Secondary | ICD-10-CM

## 2019-02-07 DIAGNOSIS — Z0001 Encounter for general adult medical examination with abnormal findings: Secondary | ICD-10-CM | POA: Diagnosis not present

## 2019-02-07 DIAGNOSIS — Z0000 Encounter for general adult medical examination without abnormal findings: Secondary | ICD-10-CM

## 2019-02-07 DIAGNOSIS — I10 Essential (primary) hypertension: Secondary | ICD-10-CM | POA: Diagnosis not present

## 2019-02-07 NOTE — Progress Notes (Signed)
Subjective:    Patient ID: Katelyn [name], female    DOB: 1945/07/30, 73 y.o.   MRN: [id number]  HPI Patient is a very pleasant 73 year old Caucasian female here today for complete physical exam.    Her mammogram was last checked in November 2019 and is due again this November.  Due to her age, she does not require Pap smear.  Her last colonoscopy was in 2018.  A repeat colonoscopy was recommended in 2021 due to the presence of polyps.  She had a bone density test in 2018 which showed a T score of -3.1 in the right hip however she is on no therapy for osteoporosis aside from taking calcium and vitamin D.  She was on Fosamax however she discontinued Fosamax at the recommendation of her dentist due to some dental work that she was having.  She never resumed the Fosamax.  Her immunizations are listed below.  She is due for Shingrix as well as a tetanus vaccine.  She denies any falls.  She is currently being treated for anxiety and depression and therefore depression screen was not performed.  She denies any difficulty with memory loss or problems with ADLs. Past Medical History:  Diagnosis Date  . Anxiety   . Arthritis    cervical and lumbar DDD  . Hyperlipidemia   . Hypertension   . Osteoporosis    Past Surgical History:  Procedure Laterality Date  . BUNIONECTOMY     right  . CERVICAL FUSION     C5-6-7  . COLONOSCOPY    . POLYPECTOMY    . radiofrequency ablasion    . SPINE SURGERY      Dr. Patrice Paradise (x2, last Nov/15) C3-T4  . UPPER GASTROINTESTINAL ENDOSCOPY     Current Outpatient Medications on File Prior to Visit  Medication Sig Dispense Refill  . ALPRAZolam (XANAX) 0.5 MG tablet TAKE 1 TABLET BY MOUTH TWICE A DAY AS NEEDED FOR ANXIETY 60 tablet 2  . Ascorbic Acid (VITAMIN C) 500 MG tablet Take 500 mg by mouth daily.      Marland Kitchen aspirin 81 MG tablet Take 81 mg by mouth daily.      . B Complex Vitamins (VITAMIN B COMPLEX PO) Take 1 tablet by mouth daily.      . calcium carbonate (OS-CAL)  600 MG TABS Take 600 mg by mouth 2 (two) times daily with a meal.      . diclofenac sodium (VOLTAREN) 1 % GEL Apply 2 g topically 4 (four) times daily.    . DULoxetine (CYMBALTA) 30 MG capsule Take 1 capsule (30 mg total) by mouth 2 (two) times daily. 180 capsule 3  . fish oil-omega-3 fatty acids 1000 MG capsule Take 1 g by mouth 2 (two) times daily.      . Garlic 2979 MG CAPS Take 1 capsule by mouth daily.      Marland Kitchen glucosamine-chondroitin 500-400 MG tablet Take 1 tablet by mouth daily.      . hydrochlorothiazide (HYDRODIURIL) 25 MG tablet TAKE 1 TABLET(25 MG) BY MOUTH DAILY 90 tablet 2  . Lysine 500 MG CAPS Take 1 capsule by mouth daily.      . magnesium oxide (MAG-OX) 400 MG tablet Take 400 mg by mouth daily.      . Multiple Vitamins-Minerals (MULTIVITAMIN WITH MINERALS) tablet Take 1 tablet by mouth daily.      . nortriptyline (PAMELOR) 10 MG capsule 10 mg. 3 tab po QAM - 3 tab po QPM    .  POTASSIUM GLUCONATE PO Take 1 tablet by mouth daily.      . simvastatin (ZOCOR) 20 MG tablet TAKE 1 TABLET(20 MG) BY MOUTH AT BEDTIME 90 tablet 2  . SUMAtriptan (IMITREX) 100 MG tablet TAKE 1/2 TABLET BY MOUTH EVERY 2 HOURS AS NEEDED FOR MIGRAINE OR HEADACHE 30 tablet 3  . traMADol (ULTRAM) 50 MG tablet Take 50 mg by mouth every 8 (eight) hours as needed. for pain  0  . vitamin E 400 UNIT capsule Take 400 Units by mouth daily.      Marland Kitchen zolpidem (AMBIEN) 5 MG tablet TAKE 1 TABLET BY MOUTH AT BEDTIME AS NEEDED FOR SLEEP 30 tablet 3   No current facility-administered medications on file prior to visit.    Allergies  Allergen Reactions  . Iodine   . Prednisone Hives  . Sulfa Antibiotics Swelling  . Hydrocodone Palpitations  . Red Dye Rash   Social History   Socioeconomic History  . Marital status: Married    Spouse name: Katelyn [name]  . Number of children: 2  . Years of education: College  . Highest education level: Not on file  Occupational History  . Occupation: Retired  Scientific laboratory technician  . Financial  resource strain: Not on file  . Food insecurity    Worry: Not on file    Inability: Not on file  . Transportation needs    Medical: Not on file    Non-medical: Not on file  Tobacco Use  . Smoking status: Never Smoker  . Smokeless tobacco: Never Used  Substance and Sexual Activity  . Alcohol use: Yes    Alcohol/week: 7.0 standard drinks    Types: 7 Glasses of wine per week    Comment: 1-2 rare  . Drug use: No  . Sexual activity: Yes  Lifestyle  . Physical activity    Days per week: Not on file    Minutes per session: Not on file  . Stress: Not on file  Relationships  . Social Herbalist on phone: Not on file    Gets together: Not on file    Attends religious service: Not on file    Active member of club or organization: Not on file    Attends meetings of clubs or organizations: Not on file    Relationship status: Not on file  . Intimate partner violence    Fear of current or ex partner: Not on file    Emotionally abused: Not on file    Physically abused: Not on file    Forced sexual activity: Not on file  Other Topics Concern  . Not on file  Social History Narrative   Lives with husband   Caffeine use: none   Family History  Problem Relation Age of Onset  . Colon polyps Mother   . Arthritis Mother   . Hearing loss Mother   . Hyperlipidemia Mother   . Hypertension Mother   . Stroke Mother   . Heart disease Mother   . Colon polyps Father   . Arthritis Father   . Heart disease Father   . Hyperlipidemia Father   . Arthritis Maternal Grandmother   . Arthritis Maternal Grandfather   . Arthritis Paternal Grandmother   . Arthritis Paternal Grandfather   . Colon cancer Neg Hx       Review of Systems  All other systems reviewed and are negative.      Objective:   Physical Exam  Constitutional: She is oriented to person,  place, and time. She appears well-developed and well-nourished. No distress.  HENT:  Head: Normocephalic and atraumatic.  Right  Ear: External ear normal.  Left Ear: External ear normal.  Nose: Nose normal.  Mouth/Throat: Oropharynx is clear and moist. No oropharyngeal exudate.  Eyes: Pupils are equal, round, and reactive to light. Conjunctivae and EOM are normal. Right eye exhibits no discharge. Left eye exhibits no discharge. No scleral icterus.  Neck: Normal range of motion. Neck supple. No JVD present. No tracheal deviation present. No thyromegaly present.  Cardiovascular: Normal rate, regular rhythm and intact distal pulses. Exam reveals no gallop and no friction rub.  No murmur heard. Pulmonary/Chest: Effort normal and breath sounds normal. No stridor. No respiratory distress. She has no wheezes. She has no rales. She exhibits no tenderness.  Abdominal: Soft. Bowel sounds are normal. She exhibits no distension and no mass. There is no abdominal tenderness. There is no rebound and no guarding.  Musculoskeletal: Normal range of motion.        General: No tenderness or edema.  Lymphadenopathy:    She has no cervical adenopathy.  Neurological: She is alert and oriented to person, place, and time. She has normal reflexes. No cranial nerve deficit. She exhibits normal muscle tone. Coordination normal.  Skin: Skin is warm. No rash noted. She is not diaphoretic. No erythema. No pallor.  Psychiatric: She has a normal mood and affect. Her behavior is normal. Judgment and thought content normal.  Vitals reviewed.         Assessment & Plan:  The primary encounter diagnosis was Osteoporosis without current pathological fracture, unspecified osteoporosis type. Diagnoses of Pure hypercholesterolemia, Essential hypertension, Routine general medical examination at a health care facility, and GAD (generalized anxiety disorder) were also pertinent to this visit. Physical exam today is outstanding.  I see no abnormalities on her exam.  Her blood pressure is excellent.  I did recommend shingrix when available.  She is already had  her flu shot this year the.  The remainder of her immunizations are up-to-date.  I recommended that she schedule a mammogram this November.  I will schedule the patient for a bone density test.  I recommended that she resume Fosamax in addition to the calcium and vitamin D that she is already taking.  Her colonoscopy is due next year.  I will check a CBC, CMP, fasting lipid panel.  Her goal LDL cholesterol is less than 100.  The remainder of her preventative care is up-to-date.  She denies any issues with falls, depression, or memory loss.

## 2019-02-08 DIAGNOSIS — H2513 Age-related nuclear cataract, bilateral: Secondary | ICD-10-CM | POA: Diagnosis not present

## 2019-02-08 LAB — COMPLETE METABOLIC PANEL WITH GFR
AG Ratio: 1.8 (calc) (ref 1.0–2.5)
ALT: 29 U/L (ref 6–29)
AST: 27 U/L (ref 10–35)
Albumin: 4.2 g/dL (ref 3.6–5.1)
Alkaline phosphatase (APISO): 66 U/L (ref 37–153)
BUN: 10 mg/dL (ref 7–25)
CO2: 28 mmol/L (ref 20–32)
Calcium: 10.1 mg/dL (ref 8.6–10.4)
Chloride: 102 mmol/L (ref 98–110)
Creat: 0.77 mg/dL (ref 0.60–0.93)
GFR, Est African American: 90 mL/min/{1.73_m2} (ref >=60)
GFR, Est Non African American: 78 mL/min/{1.73_m2} (ref >=60)
Globulin: 2.4 g/dL (calc) (ref 1.9–3.7)
Glucose, Bld: 95 mg/dL (ref 65–99)
Potassium: 4.5 mmol/L (ref 3.5–5.3)
Sodium: 139 mmol/L (ref 135–146)
Total Bilirubin: 0.4 mg/dL (ref 0.2–1.2)
Total Protein: 6.6 g/dL (ref 6.1–8.1)

## 2019-02-08 LAB — LIPID PANEL
Cholesterol: 233 mg/dL — ABNORMAL HIGH (ref <200)
HDL: 123 mg/dL (ref >=50)
LDL Cholesterol (Calc): 96 mg/dL (calc)
Non-HDL Cholesterol (Calc): 110 mg/dL (calc) (ref <130)
Total CHOL/HDL Ratio: 1.9 (calc) (ref <5.0)
Triglycerides: 58 mg/dL (ref <150)

## 2019-02-08 LAB — CBC WITH DIFFERENTIAL/PLATELET
Absolute Monocytes: 287 cells/uL (ref 200–950)
Basophils Absolute: 19 cells/uL (ref 0–200)
Basophils Relative: 0.4 %
Eosinophils Absolute: 61 cells/uL (ref 15–500)
Eosinophils Relative: 1.3 %
HCT: 38.2 % (ref 35.0–45.0)
Hemoglobin: 12.5 g/dL (ref 11.7–15.5)
Lymphs Abs: 1857 cells/uL (ref 850–3900)
MCH: 28.9 pg (ref 27.0–33.0)
MCHC: 32.7 g/dL (ref 32.0–36.0)
MCV: 88.2 fL (ref 80.0–100.0)
MPV: 9.7 fL (ref 7.5–12.5)
Monocytes Relative: 6.1 %
Neutro Abs: 2477 cells/uL (ref 1500–7800)
Neutrophils Relative %: 52.7 %
Platelets: 280 10*3/uL (ref 140–400)
RBC: 4.33 10*6/uL (ref 3.80–5.10)
RDW: 14.9 % (ref 11.0–15.0)
Total Lymphocyte: 39.5 %
WBC: 4.7 10*3/uL (ref 3.8–10.8)

## 2019-02-09 ENCOUNTER — Encounter: Payer: Self-pay | Admitting: *Deleted

## 2019-03-20 DIAGNOSIS — M5136 Other intervertebral disc degeneration, lumbar region: Secondary | ICD-10-CM | POA: Diagnosis not present

## 2019-04-10 DIAGNOSIS — M5136 Other intervertebral disc degeneration, lumbar region: Secondary | ICD-10-CM | POA: Diagnosis not present

## 2019-04-26 ENCOUNTER — Other Ambulatory Visit: Payer: Self-pay | Admitting: Family Medicine

## 2019-05-01 DIAGNOSIS — M542 Cervicalgia: Secondary | ICD-10-CM | POA: Diagnosis not present

## 2019-05-01 DIAGNOSIS — M5136 Other intervertebral disc degeneration, lumbar region: Secondary | ICD-10-CM | POA: Diagnosis not present

## 2019-05-01 DIAGNOSIS — M47816 Spondylosis without myelopathy or radiculopathy, lumbar region: Secondary | ICD-10-CM | POA: Diagnosis not present

## 2019-05-22 ENCOUNTER — Ambulatory Visit: Payer: Medicare Other

## 2019-05-22 ENCOUNTER — Other Ambulatory Visit: Payer: Medicare Other

## 2019-05-31 DIAGNOSIS — M4726 Other spondylosis with radiculopathy, lumbar region: Secondary | ICD-10-CM | POA: Diagnosis not present

## 2019-05-31 DIAGNOSIS — M5136 Other intervertebral disc degeneration, lumbar region: Secondary | ICD-10-CM | POA: Diagnosis not present

## 2019-05-31 DIAGNOSIS — M5416 Radiculopathy, lumbar region: Secondary | ICD-10-CM | POA: Diagnosis not present

## 2019-05-31 DIAGNOSIS — M545 Low back pain: Secondary | ICD-10-CM | POA: Diagnosis not present

## 2019-07-24 ENCOUNTER — Other Ambulatory Visit: Payer: Self-pay | Admitting: Family Medicine

## 2019-07-24 NOTE — Telephone Encounter (Signed)
Ok to refill??  Last office visit 02/07/2019.  Last refill 01/26/2019, #2 refills.

## 2019-08-04 DIAGNOSIS — Z4689 Encounter for fitting and adjustment of other specified devices: Secondary | ICD-10-CM | POA: Diagnosis not present

## 2019-08-04 DIAGNOSIS — M5416 Radiculopathy, lumbar region: Secondary | ICD-10-CM | POA: Diagnosis not present

## 2019-08-04 DIAGNOSIS — M48062 Spinal stenosis, lumbar region with neurogenic claudication: Secondary | ICD-10-CM | POA: Diagnosis not present

## 2019-08-04 DIAGNOSIS — M4726 Other spondylosis with radiculopathy, lumbar region: Secondary | ICD-10-CM | POA: Diagnosis not present

## 2019-08-08 DIAGNOSIS — N6459 Other signs and symptoms in breast: Secondary | ICD-10-CM | POA: Diagnosis not present

## 2019-08-08 DIAGNOSIS — D225 Melanocytic nevi of trunk: Secondary | ICD-10-CM | POA: Diagnosis not present

## 2019-08-08 DIAGNOSIS — L821 Other seborrheic keratosis: Secondary | ICD-10-CM | POA: Diagnosis not present

## 2019-08-08 DIAGNOSIS — Z23 Encounter for immunization: Secondary | ICD-10-CM | POA: Diagnosis not present

## 2019-08-08 DIAGNOSIS — L723 Sebaceous cyst: Secondary | ICD-10-CM | POA: Diagnosis not present

## 2019-08-08 DIAGNOSIS — D229 Melanocytic nevi, unspecified: Secondary | ICD-10-CM | POA: Diagnosis not present

## 2019-08-15 ENCOUNTER — Ambulatory Visit
Admission: RE | Admit: 2019-08-15 | Discharge: 2019-08-15 | Disposition: A | Discharge status: Home / Self Care | Payer: Medicare Other | Source: Ambulatory Visit | Attending: Family Medicine | Admitting: Family Medicine

## 2019-08-15 ENCOUNTER — Other Ambulatory Visit: Payer: Self-pay

## 2019-08-15 DIAGNOSIS — I4519 Other right bundle-branch block: Secondary | ICD-10-CM | POA: Diagnosis not present

## 2019-08-15 DIAGNOSIS — Z1231 Encounter for screening mammogram for malignant neoplasm of breast: Secondary | ICD-10-CM | POA: Diagnosis not present

## 2019-08-15 DIAGNOSIS — I440 Atrioventricular block, first degree: Secondary | ICD-10-CM | POA: Diagnosis not present

## 2019-08-15 DIAGNOSIS — Z01812 Encounter for preprocedural laboratory examination: Secondary | ICD-10-CM | POA: Diagnosis not present

## 2019-08-15 DIAGNOSIS — Z01810 Encounter for preprocedural cardiovascular examination: Secondary | ICD-10-CM | POA: Diagnosis not present

## 2019-08-15 DIAGNOSIS — R9431 Abnormal electrocardiogram [ECG] [EKG]: Secondary | ICD-10-CM | POA: Diagnosis not present

## 2019-08-15 DIAGNOSIS — M810 Age-related osteoporosis without current pathological fracture: Secondary | ICD-10-CM

## 2019-08-15 DIAGNOSIS — Z20822 Contact with and (suspected) exposure to covid-19: Secondary | ICD-10-CM | POA: Diagnosis not present

## 2019-08-15 DIAGNOSIS — M532X6 Spinal instabilities, lumbar region: Secondary | ICD-10-CM | POA: Diagnosis not present

## 2019-08-15 DIAGNOSIS — I498 Other specified cardiac arrhythmias: Secondary | ICD-10-CM | POA: Diagnosis not present

## 2019-08-15 DIAGNOSIS — Z780 Asymptomatic menopausal state: Secondary | ICD-10-CM | POA: Diagnosis not present

## 2019-08-17 ENCOUNTER — Other Ambulatory Visit: Payer: Self-pay | Admitting: Family Medicine

## 2019-08-17 MED ORDER — ALENDRONATE SODIUM 70 MG PO TABS: 70.0000 mg | ORAL_TABLET | ORAL | 11 refills | Status: DC

## 2019-08-22 DIAGNOSIS — M5116 Intervertebral disc disorders with radiculopathy, lumbar region: Secondary | ICD-10-CM | POA: Diagnosis not present

## 2019-08-22 DIAGNOSIS — M4716 Other spondylosis with myelopathy, lumbar region: Secondary | ICD-10-CM | POA: Diagnosis not present

## 2019-08-22 DIAGNOSIS — M2578 Osteophyte, vertebrae: Secondary | ICD-10-CM | POA: Diagnosis not present

## 2019-08-22 DIAGNOSIS — M4186 Other forms of scoliosis, lumbar region: Secondary | ICD-10-CM | POA: Diagnosis not present

## 2019-08-22 DIAGNOSIS — M48062 Spinal stenosis, lumbar region with neurogenic claudication: Secondary | ICD-10-CM | POA: Diagnosis not present

## 2019-08-22 DIAGNOSIS — M4326 Fusion of spine, lumbar region: Secondary | ICD-10-CM | POA: Diagnosis not present

## 2019-08-22 DIAGNOSIS — M5416 Radiculopathy, lumbar region: Secondary | ICD-10-CM | POA: Diagnosis not present

## 2019-08-22 DIAGNOSIS — M5134 Other intervertebral disc degeneration, thoracic region: Secondary | ICD-10-CM | POA: Insufficient documentation

## 2019-08-22 DIAGNOSIS — Z79899 Other long term (current) drug therapy: Secondary | ICD-10-CM | POA: Diagnosis not present

## 2019-08-22 DIAGNOSIS — M4156 Other secondary scoliosis, lumbar region: Secondary | ICD-10-CM | POA: Diagnosis not present

## 2019-08-22 DIAGNOSIS — Z981 Arthrodesis status: Secondary | ICD-10-CM | POA: Diagnosis not present

## 2019-08-22 DIAGNOSIS — M4726 Other spondylosis with radiculopathy, lumbar region: Secondary | ICD-10-CM | POA: Diagnosis not present

## 2019-08-22 DIAGNOSIS — M532X6 Spinal instabilities, lumbar region: Secondary | ICD-10-CM | POA: Diagnosis not present

## 2019-08-23 DIAGNOSIS — M48062 Spinal stenosis, lumbar region with neurogenic claudication: Secondary | ICD-10-CM | POA: Diagnosis not present

## 2019-08-23 DIAGNOSIS — M4726 Other spondylosis with radiculopathy, lumbar region: Secondary | ICD-10-CM | POA: Diagnosis not present

## 2019-08-23 DIAGNOSIS — M5134 Other intervertebral disc degeneration, thoracic region: Secondary | ICD-10-CM | POA: Diagnosis not present

## 2019-08-23 DIAGNOSIS — M2578 Osteophyte, vertebrae: Secondary | ICD-10-CM | POA: Diagnosis not present

## 2019-08-23 DIAGNOSIS — M4186 Other forms of scoliosis, lumbar region: Secondary | ICD-10-CM | POA: Diagnosis not present

## 2019-08-23 DIAGNOSIS — M40209 Unspecified kyphosis, site unspecified: Secondary | ICD-10-CM | POA: Insufficient documentation

## 2019-08-23 DIAGNOSIS — M961 Postlaminectomy syndrome, not elsewhere classified: Secondary | ICD-10-CM | POA: Insufficient documentation

## 2019-08-23 DIAGNOSIS — M4326 Fusion of spine, lumbar region: Secondary | ICD-10-CM | POA: Diagnosis not present

## 2019-08-23 DIAGNOSIS — M4800 Spinal stenosis, site unspecified: Secondary | ICD-10-CM | POA: Insufficient documentation

## 2019-08-23 DIAGNOSIS — M5116 Intervertebral disc disorders with radiculopathy, lumbar region: Secondary | ICD-10-CM | POA: Diagnosis not present

## 2019-09-21 DIAGNOSIS — Z79891 Long term (current) use of opiate analgesic: Secondary | ICD-10-CM | POA: Diagnosis not present

## 2019-09-21 DIAGNOSIS — G894 Chronic pain syndrome: Secondary | ICD-10-CM | POA: Diagnosis not present

## 2019-09-21 DIAGNOSIS — Z79899 Other long term (current) drug therapy: Secondary | ICD-10-CM | POA: Diagnosis not present

## 2019-10-20 ENCOUNTER — Other Ambulatory Visit: Payer: Self-pay | Admitting: Family Medicine

## 2019-11-15 DIAGNOSIS — M545 Low back pain: Secondary | ICD-10-CM | POA: Diagnosis not present

## 2019-11-15 DIAGNOSIS — M7918 Myalgia, other site: Secondary | ICD-10-CM | POA: Diagnosis not present

## 2019-11-29 ENCOUNTER — Encounter: Payer: Self-pay | Admitting: Internal Medicine

## 2019-12-26 ENCOUNTER — Encounter: Payer: Medicare Other | Admitting: Internal Medicine

## 2020-01-01 ENCOUNTER — Other Ambulatory Visit: Payer: Self-pay | Admitting: Family Medicine

## 2020-01-05 ENCOUNTER — Other Ambulatory Visit: Payer: Self-pay | Admitting: Family Medicine

## 2020-01-08 ENCOUNTER — Other Ambulatory Visit: Payer: Self-pay

## 2020-01-08 MED ORDER — ALENDRONATE SODIUM 70 MG PO TABS: ORAL_TABLET | ORAL | 3 refills | Status: DC

## 2020-01-09 ENCOUNTER — Ambulatory Visit (INDEPENDENT_AMBULATORY_CARE_PROVIDER_SITE_OTHER): Payer: Medicare Other | Admitting: Nurse Practitioner

## 2020-01-09 ENCOUNTER — Other Ambulatory Visit: Payer: Self-pay

## 2020-01-09 VITALS — BP 120/80 | HR 78 | Temp 97.6°F | Resp 18 | Wt 108.0 lb

## 2020-01-09 DIAGNOSIS — R319 Hematuria, unspecified: Secondary | ICD-10-CM | POA: Diagnosis not present

## 2020-01-09 DIAGNOSIS — R399 Unspecified symptoms and signs involving the genitourinary system: Secondary | ICD-10-CM

## 2020-01-09 DIAGNOSIS — N390 Urinary tract infection, site not specified: Secondary | ICD-10-CM | POA: Diagnosis not present

## 2020-01-09 LAB — URINALYSIS, ROUTINE W REFLEX MICROSCOPIC
Bilirubin Urine: NEGATIVE
Glucose, UA: NEGATIVE
Ketones, ur: NEGATIVE
Nitrite: NEGATIVE
Specific Gravity, Urine: 1.015 (ref 1.001–1.03)
pH: 7.5 (ref 5.0–8.0)

## 2020-01-09 LAB — MICROSCOPIC MESSAGE

## 2020-01-09 MED ORDER — CIPROFLOXACIN HCL 500 MG PO TABS: 500.0000 mg | ORAL_TABLET | Freq: Two times a day (BID) | ORAL | 0 refills | Status: AC

## 2020-01-09 NOTE — Progress Notes (Addendum)
Established Patient Office Visit  Subjective:  Patient ID: Katelyn [name], female    DOB: 01/29/1946  Age: 74 y.o. MRN: [id number]  CC:  Chief Complaint  Patient presents with  . Urinary Tract Infection    dysuria, hematuria and clots, started x1 week, having R side lower back pain, cranberry juice, ibuprofen    HPI Katelyn [name]  Is a  74 year old female presenting with uti sxs of pelvic pressure, seeing small amounts of blood in urine, urinary freq, and urinary urgency. No fever, chills, CVA tenderness.   No txs tried.   Past Medical History:  Diagnosis Date  . Anxiety   . Arthritis    cervical and lumbar DDD  . Hyperlipidemia   . Hypertension   . Osteoporosis     Past Surgical History:  Procedure Laterality Date  . BUNIONECTOMY     right  . CERVICAL FUSION     C5-6-7  . COLONOSCOPY    . POLYPECTOMY    . radiofrequency ablasion    . SPINE SURGERY      Dr. Patrice Paradise (x2, last Nov/15) C3-T4  . UPPER GASTROINTESTINAL ENDOSCOPY      Family History  Problem Relation Age of Onset  . Colon polyps Mother   . Arthritis Mother   . Hearing loss Mother   . Hyperlipidemia Mother   . Hypertension Mother   . Stroke Mother   . Heart disease Mother   . Colon polyps Father   . Arthritis Father   . Heart disease Father   . Hyperlipidemia Father   . Arthritis Maternal Grandmother   . Arthritis Maternal Grandfather   . Arthritis Paternal Grandmother   . Arthritis Paternal Grandfather   . Colon cancer Neg Hx     Social History   Socioeconomic History  . Marital status: Married    Spouse name: [name]  . Number of children: 2  . Years of education: College  . Highest education level: Not on file  Occupational History  . Occupation: Retired  Tobacco Use  . Smoking status: Never Smoker  . Smokeless tobacco: Never Used  Vaping Use  . Vaping Use: Never used  Substance and Sexual Activity  . Alcohol use: Yes    Alcohol/week: 7.0 standard drinks    Types: 7 Glasses  of wine per week    Comment: 1-2 rare  . Drug use: No  . Sexual activity: Yes  Other Topics Concern  . Not on file  Social History Narrative   Lives with husband   Caffeine use: none   Social Determinants of Health   Financial Resource Strain:   . Difficulty of Paying Living Expenses:   Food Insecurity:   . Worried About Charity fundraiser in the Last Year:   . Arboriculturist in the Last Year:   Transportation Needs:   . Film/video editor (Medical):   Marland Kitchen Lack of Transportation (Non-Medical):   Physical Activity:   . Days of Exercise per Week:   . Minutes of Exercise per Session:   Stress:   . Feeling of Stress :   Social Connections:   . Frequency of Communication with Friends and Family:   . Frequency of Social Gatherings with Friends and Family:   . Attends Religious Services:   . Active Member of Clubs or Organizations:   . Attends Archivist Meetings:   Marland Kitchen Marital Status:   Intimate Partner Violence:   . Fear of Current or Ex-Partner:   .  Emotionally Abused:   Marland Kitchen Physically Abused:   . Sexually Abused:     Outpatient Medications Prior to Visit  Medication Sig Dispense Refill  . alendronate (FOSAMAX) 70 MG tablet TAKE 1 TABLET BY MOUTH EVERY 7 DAYS WITH A FULL GLASS OF WATER ON AN EMPTY STOMACH. 12 tablet 3  . ALPRAZolam (XANAX) 0.5 MG tablet TAKE 1 TABLET BY MOUTH TWICE A DAY AS NEEDED FOR ANXIETY 60 tablet 2  . Ascorbic Acid (VITAMIN C) 500 MG tablet Take 500 mg by mouth daily.      Marland Kitchen aspirin 81 MG tablet Take 81 mg by mouth daily.      . B Complex Vitamins (VITAMIN B COMPLEX PO) Take 1 tablet by mouth daily.      . calcium carbonate (OS-CAL) 600 MG TABS Take 600 mg by mouth 2 (two) times daily with a meal.      . diclofenac sodium (VOLTAREN) 1 % GEL Apply 2 g topically 4 (four) times daily.    . DULoxetine (CYMBALTA) 30 MG capsule Take 1 capsule (30 mg total) by mouth 2 (two) times daily. 180 capsule 3  . fish oil-omega-3 fatty acids 1000 MG  capsule Take 1 g by mouth 2 (two) times daily.      . Garlic 4782 MG CAPS Take 1 capsule by mouth daily.      Marland Kitchen glucosamine-chondroitin 500-400 MG tablet Take 1 tablet by mouth daily.      . hydrochlorothiazide (HYDRODIURIL) 25 MG tablet TAKE 1 TABLET BY MOUTH DAILY 90 tablet 2  . Lysine 500 MG CAPS Take 1 capsule by mouth daily.      . magnesium oxide (MAG-OX) 400 MG tablet Take 400 mg by mouth daily.      . Multiple Vitamins-Minerals (MULTIVITAMIN WITH MINERALS) tablet Take 1 tablet by mouth daily.      . nortriptyline (PAMELOR) 10 MG capsule 10 mg. 3 tab po QAM - 3 tab po QPM    . POTASSIUM GLUCONATE PO Take 1 tablet by mouth daily.      . simvastatin (ZOCOR) 20 MG tablet TAKE 1 TABLET BY MOUTH AT BEDTIME 90 tablet 2  . SUMAtriptan (IMITREX) 100 MG tablet TAKE 1/2 TABLET BY MOUTH EVERY 2 HOURS AS NEEDED FOR MIGRAINE OR HEADACHE 30 tablet 3  . traMADol (ULTRAM) 50 MG tablet Take 50 mg by mouth every 8 (eight) hours as needed. for pain  0  . vitamin E 400 UNIT capsule Take 400 Units by mouth daily.      Marland Kitchen zolpidem (AMBIEN) 5 MG tablet TAKE 1 TABLET BY MOUTH AT BEDTIME AS NEEDED FOR SLEEP 30 tablet 3   No facility-administered medications prior to visit.    Allergies  Allergen Reactions  . Iodine   . Prednisone Hives  . Sulfa Antibiotics Swelling  . Hydrocodone Palpitations  . Red Dye Rash    ROS Review of Systems  All other systems reviewed and are negative.     Objective:    Physical Exam Vitals and nursing note reviewed.  Constitutional:      General: She is not in acute distress.    Appearance: Normal appearance. She is not ill-appearing, toxic-appearing or diaphoretic.  HENT:     Head: Normocephalic.  Eyes:     Extraocular Movements: Extraocular movements intact.     Conjunctiva/sclera: Conjunctivae normal.     Pupils: Pupils are equal, round, and reactive to light.  Cardiovascular:     Rate and Rhythm: Normal rate.  Pulmonary:  Effort: Pulmonary effort is  normal.  Abdominal:     Tenderness: There is no right CVA tenderness or left CVA tenderness.  Musculoskeletal:        General: Normal range of motion.     Cervical back: Normal range of motion and neck supple.     Right lower leg: No edema.     Left lower leg: No edema.  Skin:    General: Skin is warm and dry.     Coloration: Skin is not jaundiced or pale.     Findings: No bruising or rash.  Neurological:     General: No focal deficit present.     Mental Status: She is alert and oriented to person, place, and time.  Psychiatric:        Attention and Perception: Attention normal.        Mood and Affect: Mood and affect normal.        Speech: Speech normal.        Behavior: Behavior normal.        Thought Content: Thought content normal.        Cognition and Memory: Cognition normal.        Judgment: Judgment normal.     BP 120/80 (BP Location: Left Arm, Patient Position: Sitting, Cuff Size: Normal)   Pulse 78   Temp 97.6 F (36.4 C) (Temporal)   Resp 18   Wt 108 lb (49 kg)   SpO2 99%   BMI 19.13 kg/m  Wt Readings from Last 3 Encounters:  01/09/20 108 lb (49 kg)  02/07/19 105 lb (47.6 kg)  11/04/17 105 lb (47.6 kg)     Health Maintenance Due  Topic Date Due  . Hepatitis C Screening  Never done  . COVID-19 Vaccine (1) Never done  . TETANUS/TDAP  Never done  . COLONOSCOPY  01/02/2020    There are no preventive care reminders to display for this patient.  Lab Results  Component Value Date   TSH 0.73 09/11/2015   Lab Results  Component Value Date   WBC 4.7 02/07/2019   HGB 12.5 02/07/2019   HCT 38.2 02/07/2019   MCV 88.2 02/07/2019   PLT 280 02/07/2019   Lab Results  Component Value Date   NA 139 02/07/2019   K 4.5 02/07/2019   CO2 28 02/07/2019   GLUCOSE 95 02/07/2019   BUN 10 02/07/2019   CREATININE 0.77 02/07/2019   BILITOT 0.4 02/07/2019   ALKPHOS 64 10/27/2016   AST 27 02/07/2019   ALT 29 02/07/2019   PROT 6.6 02/07/2019   ALBUMIN 4.1  10/27/2016   CALCIUM 10.1 02/07/2019   Lab Results  Component Value Date   CHOL 233 (H) 02/07/2019   Lab Results  Component Value Date   HDL 123 02/07/2019   Lab Results  Component Value Date   LDLCALC 96 02/07/2019   Lab Results  Component Value Date   TRIG 58 02/07/2019   Lab Results  Component Value Date   CHOLHDL 1.9 02/07/2019   No results found for: HGBA1C    Assessment & Plan:   Problem List Items Addressed This Visit    None    Visit Diagnoses    UTI symptoms    -  Primary   Relevant Orders   Urinalysis, Routine w reflex microscopic   Urinary tract infection with hematuria, site unspecified       Relevant Medications   ciprofloxacin (CIPRO) 500 MG tablet   Other Relevant Orders  Urine Culture    U/A positive will prescribe antibiotic for Cipro.  Drink plenty of water Follow up for continue sxs, new or worsening sxs.   Meds ordered this encounter  Medications  . ciprofloxacin (CIPRO) 500 MG tablet    Sig: Take 1 tablet (500 mg total) by mouth 2 (two) times daily for 7 days.    Dispense:  14 tablet    Refill:  0    Follow-up: Return if symptoms worsen or fail to improve.    Annie Main, FNP

## 2020-01-09 NOTE — Patient Instructions (Signed)
U/A positive will prescribe antibiotic for Cipro.  Drink plenty of water Follow up for continue sxs, new or worsening sxs.

## 2020-01-09 NOTE — Addendum Note (Signed)
Addended by: Ishmael Holter on: 01/09/2020 04:26 PM   Modules accepted: Orders

## 2020-01-11 ENCOUNTER — Other Ambulatory Visit: Payer: Self-pay | Admitting: Internal Medicine

## 2020-01-11 ENCOUNTER — Other Ambulatory Visit: Payer: Self-pay

## 2020-01-11 ENCOUNTER — Ambulatory Visit (AMBULATORY_SURGERY_CENTER): Payer: Self-pay | Admitting: *Deleted

## 2020-01-11 VITALS — Ht 63.0 in | Wt 107.0 lb

## 2020-01-11 DIAGNOSIS — Z86010 Personal history of colonic polyps: Secondary | ICD-10-CM

## 2020-01-11 MED ORDER — SUTAB 1479-225-188 MG PO TABS: 1.0000 | ORAL_TABLET | Freq: Once | ORAL | 0 refills | Status: DC

## 2020-01-11 NOTE — Telephone Encounter (Signed)
You saw this patient today - [facility] trying to say Sutab is on backorder and can't be obtained.  I don't think that's right.  I would call them and challenge this.

## 2020-01-11 NOTE — Progress Notes (Signed)
2nd dose of covid in February per pt Pt is aware that care partner will wait in the car during procedure; if they feel like they will be too hot or cold to wait in the car; they may wait in the 4 th floor lobby. Patient is aware to bring only one care partner. We want them to wear a mask (we do not have any that we can provide them), practice social distancing, and we will check their temperatures when they get here.  I did remind the patient that their care partner needs to stay in the parking lot the entire time and have a cell phone available, we will call them when the pt is ready for discharge. Patient will wear mask into building.   No trouble with anesthesia, difficulty with intubation or hx/fam hx of malignant hyperthermia per pt   No egg or soy allergy  No home oxygen use   No medications for weight loss taken  Pt denies constipation issues   Sutab code put into RX and paper copy given to pt to show pharmacy

## 2020-01-12 LAB — URINE CULTURE
MICRO NUMBER:: 10726831
SPECIMEN QUALITY:: ADEQUATE

## 2020-01-15 ENCOUNTER — Other Ambulatory Visit: Payer: Self-pay | Admitting: *Deleted

## 2020-01-15 NOTE — Telephone Encounter (Signed)
Spoke with pharmacist- Nila Nephew is not on back order, this was an error.

## 2020-01-25 ENCOUNTER — Ambulatory Visit (AMBULATORY_SURGERY_CENTER): Payer: Medicare Other | Admitting: Internal Medicine

## 2020-01-25 ENCOUNTER — Encounter: Payer: Self-pay | Admitting: Internal Medicine

## 2020-01-25 ENCOUNTER — Other Ambulatory Visit: Payer: Self-pay

## 2020-01-25 VITALS — BP 116/72 | HR 43 | Temp 97.8°F | Resp 9 | Ht 63.0 in | Wt 107.0 lb

## 2020-01-25 DIAGNOSIS — D123 Benign neoplasm of transverse colon: Secondary | ICD-10-CM | POA: Diagnosis not present

## 2020-01-25 DIAGNOSIS — D124 Benign neoplasm of descending colon: Secondary | ICD-10-CM | POA: Diagnosis not present

## 2020-01-25 DIAGNOSIS — I10 Essential (primary) hypertension: Secondary | ICD-10-CM | POA: Diagnosis not present

## 2020-01-25 DIAGNOSIS — Z86010 Personal history of colonic polyps: Secondary | ICD-10-CM

## 2020-01-25 MED ORDER — SODIUM CHLORIDE 0.9 % IV SOLN: 500.0000 mL | INTRAVENOUS | Status: DC

## 2020-01-25 NOTE — Op Note (Signed)
[facility] Patient Name: Katelyn [name] Procedure Date: 01/25/2020 9:23 AM MRN: [id number] Endoscopist: Docia Chuck. Henrene Pastor , MD Age: 74 Referring MD:  Date of Birth: December 16, 1945 Gender: Female Account #: [id number] Procedure:                Colonoscopy with cold snare polypectomy x 3. Indications:              High risk colon cancer surveillance: Personal                            history of multiple (3 or more) adenomas. Multiple                            polyps on multiple colonoscopies in [place] and                            [place]. Most recent exams 2012, 2018 (9                            small adenomas). Father with a history of colon                            cancer around age 40 Medicines:                Monitored Anesthesia Care Procedure:                Pre-Anesthesia Assessment:                           - Prior to the procedure, a History and Physical                            was performed, and patient medications and                            allergies were reviewed. The patient's tolerance of                            previous anesthesia was also reviewed. The risks                            and benefits of the procedure and the sedation                            options and risks were discussed with the patient.                            All questions were answered, and informed consent                            was obtained. Prior Anticoagulants: The patient has                            taken no previous anticoagulant or antiplatelet  agents. ASA Grade Assessment: II - A patient with                            mild systemic disease. After reviewing the risks                            and benefits, the patient was deemed in                            satisfactory condition to undergo the procedure.                           After obtaining informed consent, the colonoscope                            was passed  under direct vision. Throughout the                            procedure, the patient's blood pressure, pulse, and                            oxygen saturations were monitored continuously. The                            Colonoscope was introduced through the anus and                            advanced to the the cecum, identified by                            appendiceal orifice and ileocecal valve. The                            ileocecal valve, appendiceal orifice, and rectum                            were photographed. The quality of the bowel                            preparation was good. The colonoscopy was performed                            without difficulty. The patient tolerated the                            procedure well. The bowel preparation used was                            SUPREP via split dose instruction. Scope In: 9:29:56 AM Scope Out: 9:51:58 AM Scope Withdrawal Time: 0 hours 15 minutes 59 seconds  Total Procedure Duration: 0 hours 22 minutes 2 seconds  Findings:                 Three polyps were found in the descending colon and  transverse colon. The polyps were 2 to 3 mm in                            size. These polyps were removed with a cold snare.                            Resection and retrieval were complete.                           Internal hemorrhoids were found during                            retroflexion. The hemorrhoids were large.                           The exam was otherwise without abnormality on                            direct and retroflexion views. Complications:            No immediate complications. Estimated blood loss:                            None. Estimated Blood Loss:     Estimated blood loss: none. Impression:               - Three 2 to 3 mm polyps in the descending colon                            and in the transverse colon, removed with a cold                            snare. Resected and  retrieved.                           - Internal hemorrhoids.                           - The examination was otherwise normal on direct                            and retroflexion views. Recommendation:           - Repeat colonoscopy in 3 years for surveillance                            (PEDIATRIC SCOPE).                           - Patient has a contact number available for                            emergencies. The signs and symptoms of potential                            delayed complications were discussed with the  patient. Return to normal activities tomorrow.                            Written discharge instructions were provided to the                            patient.                           - Resume previous diet.                           - Continue present medications.                           - Await pathology results. Docia Chuck. Henrene Pastor, MD 01/25/2020 9:59:17 AM This report has been signed electronically.

## 2020-01-25 NOTE — Progress Notes (Signed)
Called to room to assist during endoscopic procedure.  Patient ID and intended procedure confirmed with present staff. Received instructions for my participation in the procedure from the performing physician.  

## 2020-01-25 NOTE — Progress Notes (Signed)
Report to PACU, RN, vss, BBS= Clear.  

## 2020-01-25 NOTE — Patient Instructions (Signed)
Discharge instructions given. Handouts on polyps and hemorrhoids. Resume previous medications. YOU HAD AN ENDOSCOPIC PROCEDURE TODAY AT THE [facility]:   Refer to the procedure report that was given to you for any specific questions about what was found during the examination.  If the procedure report does not answer your questions, please call your gastroenterologist to clarify.  If you requested that your care partner not be given the details of your procedure findings, then the procedure report has been included in a sealed envelope for you to review at your convenience later.  YOU SHOULD EXPECT: Some feelings of bloating in the abdomen. Passage of more gas than usual.  Walking can help get rid of the air that was put into your GI tract during the procedure and reduce the bloating. If you had a lower endoscopy (such as a colonoscopy or flexible sigmoidoscopy) you may notice spotting of blood in your stool or on the toilet paper. If you underwent a bowel prep for your procedure, you may not have a normal bowel movement for a few days.  Please Note:  You might notice some irritation and congestion in your nose or some drainage.  This is from the oxygen used during your procedure.  There is no need for concern and it should clear up in a day or so.  SYMPTOMS TO REPORT IMMEDIATELY:  Following lower endoscopy (colonoscopy or flexible sigmoidoscopy):  Excessive amounts of blood in the stool  Significant tenderness or worsening of abdominal pains  Swelling of the abdomen that is new, acute  Fever of 100F or higher   For urgent or emergent issues, a gastroenterologist can be reached at any hour by calling [phone]. Do not use MyChart messaging for urgent concerns.    DIET:  We do recommend a small meal at first, but then you may proceed to your regular diet.  Drink plenty of fluids but you should avoid alcoholic beverages for 24 hours.  ACTIVITY:  You should plan to take it  easy for the rest of today and you should NOT DRIVE or use heavy machinery until tomorrow (because of the sedation medicines used during the test).    FOLLOW UP: Our staff will call the number listed on your records 48-72 hours following your procedure to check on you and address any questions or concerns that you may have regarding the information given to you following your procedure. If we do not reach you, we will leave a message.  We will attempt to reach you two times.  During this call, we will ask if you have developed any symptoms of COVID 19. If you develop any symptoms (ie: fever, flu-like symptoms, shortness of breath, cough etc.) before then, please call [phone].  If you test positive for Covid 19 in the 2 weeks post procedure, please call and report this information to us.    If any biopsies were taken you will be contacted by phone or by letter within the next 1-3 weeks.  Please call us at [phone] if you have not heard about the biopsies in 3 weeks.    SIGNATURES/CONFIDENTIALITY: You and/or your care partner have signed paperwork which will be entered into your electronic medical record.  These signatures attest to the fact that that the information above on your After Visit Summary has been reviewed and is understood.  Full responsibility of the confidentiality of this discharge information lies with you and/or your care-partner.  

## 2020-01-29 ENCOUNTER — Telehealth: Payer: Self-pay

## 2020-01-29 ENCOUNTER — Telehealth: Payer: Self-pay | Admitting: *Deleted

## 2020-01-29 ENCOUNTER — Encounter: Payer: Self-pay | Admitting: Internal Medicine

## 2020-01-29 NOTE — Telephone Encounter (Signed)
Attempted f/u phone call. No answer. Left message. °

## 2020-01-29 NOTE — Telephone Encounter (Signed)
2nd follow up call made.  NALM 

## 2020-02-12 ENCOUNTER — Ambulatory Visit (INDEPENDENT_AMBULATORY_CARE_PROVIDER_SITE_OTHER): Payer: Medicare Other | Admitting: Family Medicine

## 2020-02-12 ENCOUNTER — Other Ambulatory Visit: Payer: Self-pay

## 2020-02-12 VITALS — BP 102/70 | HR 82 | Temp 98.3°F | Ht 63.0 in | Wt 105.0 lb

## 2020-02-12 DIAGNOSIS — Z0001 Encounter for general adult medical examination with abnormal findings: Secondary | ICD-10-CM

## 2020-02-12 DIAGNOSIS — I10 Essential (primary) hypertension: Secondary | ICD-10-CM

## 2020-02-12 DIAGNOSIS — F411 Generalized anxiety disorder: Secondary | ICD-10-CM

## 2020-02-12 DIAGNOSIS — M810 Age-related osteoporosis without current pathological fracture: Secondary | ICD-10-CM

## 2020-02-12 DIAGNOSIS — E7800 Pure hypercholesterolemia, unspecified: Secondary | ICD-10-CM | POA: Diagnosis not present

## 2020-02-12 DIAGNOSIS — Z0000 Encounter for general adult medical examination without abnormal findings: Secondary | ICD-10-CM

## 2020-02-12 MED ORDER — DEXAMETHASONE 0.75 MG PO TABS
0.7500 mg | ORAL_TABLET | Freq: Two times a day (BID) | ORAL | 0 refills | Status: DC
Start: 2020-02-12 — End: 2021-02-14

## 2020-02-12 NOTE — Progress Notes (Signed)
Subjective:    Patient ID: Katelyn [name], female    DOB: [birth year], 74 y.o.   MRN: [id number]  HPI Patient is a very pleasant 74 year old Caucasian female here today for complete physical exam.    Patient recently was helping a friend move.  Afterwards she is having severe pain in her neck radiating down her lower back.  She is also reporting sciatica-like symptoms in her lower back going down her right leg.  She is requesting dexamethasone.  She has a history of an allergy to prednisone however she cannot tolerate dexamethasone.  She would like to take it for 6 days that she has in the past which is significantly helped with inflammation and pain in her neck and back.  She had a mammogram in February of this year that was normal.  She had a colonoscopy in August that revealed 3 tubular adenomas.  She is due for repeat colonoscopy in 3 years.  She had a bone density test performed that showed a T score of -3.  She is on Fosamax, calcium, vitamin D.  She denies any falls, depression, or memory loss.  She declines hepatitis C screening.  She declines a tetanus shot.  She has had her Covid vaccination.  The remainder of her immunizations are up-to-date: Immunization History  Administered Date(s) Administered  . Influenza, High Dose Seasonal PF 02/02/2019  . Pneumococcal Conjugate-13 10/17/2015  . Pneumococcal Polysaccharide-23 09/24/2014  . Zoster 06/21/2013    Past Medical History:  Diagnosis Date  . Anxiety   . Arthritis    cervical and lumbar DDD  . Heart murmur   . Hyperlipidemia   . Hypertension   . Osteoporosis    Past Surgical History:  Procedure Laterality Date  . BUNIONECTOMY     right  . CERVICAL FUSION     x3  . COLONOSCOPY    . POLYPECTOMY    . radiofrequency ablasion    . SPINE SURGERY      Dr. Patrice Paradise (x2, last Nov/15) C3-T4  . UPPER GASTROINTESTINAL ENDOSCOPY     Current Outpatient Medications on File Prior to Visit  Medication Sig Dispense Refill  . alendronate  (FOSAMAX) 70 MG tablet TAKE 1 TABLET BY MOUTH EVERY 7 DAYS WITH A FULL GLASS OF WATER ON AN EMPTY STOMACH. 12 tablet 3  . ALPRAZolam (XANAX) 0.5 MG tablet TAKE 1 TABLET BY MOUTH TWICE A DAY AS NEEDED FOR ANXIETY 60 tablet 2  . Ascorbic Acid (VITAMIN C) 500 MG tablet Take 500 mg by mouth daily.      Marland Kitchen aspirin 81 MG tablet Take 81 mg by mouth daily.      . B Complex Vitamins (VITAMIN B COMPLEX PO) Take 1 tablet by mouth daily.      . calcium carbonate (OS-CAL) 600 MG TABS Take 600 mg by mouth 2 (two) times daily with a meal.      . diclofenac sodium (VOLTAREN) 1 % GEL Apply 2 g topically 4 (four) times daily.    . DULoxetine (CYMBALTA) 30 MG capsule Take 1 capsule (30 mg total) by mouth 2 (two) times daily. 180 capsule 3  . fish oil-omega-3 fatty acids 1000 MG capsule Take 1 g by mouth 2 (two) times daily.      . Garlic 3762 MG CAPS Take 1 capsule by mouth daily.      Marland Kitchen glucosamine-chondroitin 500-400 MG tablet Take 1 tablet by mouth daily.      . hydrochlorothiazide (HYDRODIURIL) 25 MG tablet TAKE 1  TABLET BY MOUTH DAILY 90 tablet 2  . Lysine 500 MG CAPS Take 1 capsule by mouth daily.      . magnesium oxide (MAG-OX) 400 MG tablet Take 400 mg by mouth daily.      . Multiple Vitamins-Minerals (MULTIVITAMIN WITH MINERALS) tablet Take 1 tablet by mouth daily.      . nortriptyline (PAMELOR) 10 MG capsule 10 mg. 3 tab po QAM - 3 tab po QPM    . POTASSIUM GLUCONATE PO Take 1 tablet by mouth daily.      . simvastatin (ZOCOR) 20 MG tablet TAKE 1 TABLET BY MOUTH AT BEDTIME 90 tablet 2  . traMADol (ULTRAM) 50 MG tablet Take 50 mg by mouth every 8 (eight) hours as needed. for pain  0  . vitamin E 400 UNIT capsule Take 400 Units by mouth daily.      . SUMAtriptan (IMITREX) 100 MG tablet TAKE 1/2 TABLET BY MOUTH EVERY 2 HOURS AS NEEDED FOR MIGRAINE OR HEADACHE (Patient not taking: Reported on 01/25/2020) 30 tablet 3  . zolpidem (AMBIEN) 5 MG tablet TAKE 1 TABLET BY MOUTH AT BEDTIME AS NEEDED FOR SLEEP (Patient  not taking: Reported on 01/25/2020) 30 tablet 3   No current facility-administered medications on file prior to visit.   Allergies  Allergen Reactions  . Iodine Hives  . Prednisone Hives    Pt took Benadryl with Prednisone for surgery and did ok with it  . Sulfa Antibiotics Swelling  . Hydrocodone Palpitations  . Red Dye Rash   Social History   Socioeconomic History  . Marital status: Married    Spouse name: [name]  . Number of children: 2  . Years of education: College  . Highest education level: Not on file  Occupational History  . Occupation: Retired  Tobacco Use  . Smoking status: Never Smoker  . Smokeless tobacco: Never Used  Vaping Use  . Vaping Use: Never used  Substance and Sexual Activity  . Alcohol use: Yes    Alcohol/week: 7.0 standard drinks    Types: 7 Glasses of wine per week    Comment: 1-2 rare  . Drug use: No  . Sexual activity: Yes  Other Topics Concern  . Not on file  Social History Narrative   Lives with husband   Caffeine use: none   Social Determinants of Health   Financial Resource Strain:   . Difficulty of Paying Living Expenses: Not on file  Food Insecurity:   . Worried About Charity fundraiser in the Last Year: Not on file  . Ran Out of Food in the Last Year: Not on file  Transportation Needs:   . Lack of Transportation (Medical): Not on file  . Lack of Transportation (Non-Medical): Not on file  Physical Activity:   . Days of Exercise per Week: Not on file  . Minutes of Exercise per Session: Not on file  Stress:   . Feeling of Stress : Not on file  Social Connections:   . Frequency of Communication with Friends and Family: Not on file  . Frequency of Social Gatherings with Friends and Family: Not on file  . Attends Religious Services: Not on file  . Active Member of Clubs or Organizations: Not on file  . Attends Archivist Meetings: Not on file  . Marital Status: Not on file  Intimate Partner Violence:   . Fear of  Current or Ex-Partner: Not on file  . Emotionally Abused: Not on file  .  Physically Abused: Not on file  . Sexually Abused: Not on file   Family History  Problem Relation Age of Onset  . Colon polyps Mother   . Arthritis Mother   . Hearing loss Mother   . Hyperlipidemia Mother   . Hypertension Mother   . Stroke Mother   . Heart disease Mother   . Colon polyps Father   . Arthritis Father   . Heart disease Father   . Hyperlipidemia Father   . Arthritis Maternal Grandmother   . Arthritis Maternal Grandfather   . Arthritis Paternal Grandmother   . Arthritis Paternal Grandfather   . Colon cancer Neg Hx   . Esophageal cancer Neg Hx   . Stomach cancer Neg Hx   . Rectal cancer Neg Hx       Review of Systems  All other systems reviewed and are negative.      Objective:   Physical Exam Vitals reviewed.  Constitutional:      General: She is not in acute distress.    Appearance: She is well-developed. She is not diaphoretic.  HENT:     Head: Normocephalic and atraumatic.     Right Ear: External ear normal.     Left Ear: External ear normal.     Nose: Nose normal.     Mouth/Throat:     Pharynx: No oropharyngeal exudate.  Eyes:     General: No scleral icterus.       Right eye: No discharge.        Left eye: No discharge.     Conjunctiva/sclera: Conjunctivae normal.     Pupils: Pupils are equal, round, and reactive to light.  Neck:     Thyroid: No thyromegaly.     Vascular: No JVD.     Trachea: No tracheal deviation.  Cardiovascular:     Rate and Rhythm: Normal rate and regular rhythm.     Heart sounds: No murmur heard.  No friction rub. No gallop.   Pulmonary:     Effort: Pulmonary effort is normal. No respiratory distress.     Breath sounds: Normal breath sounds. No stridor. No wheezing or rales.  Chest:     Chest wall: No tenderness.  Abdominal:     General: Bowel sounds are normal. There is no distension.     Palpations: Abdomen is soft. There is no mass.      Tenderness: There is no abdominal tenderness. There is no guarding or rebound.  Musculoskeletal:        General: No tenderness. Normal range of motion.     Cervical back: Normal range of motion and neck supple.  Lymphadenopathy:     Cervical: No cervical adenopathy.  Skin:    General: Skin is warm.     Coloration: Skin is not pale.     Findings: No erythema or rash.  Neurological:     Mental Status: She is alert and oriented to person, place, and time.     Cranial Nerves: No cranial nerve deficit.     Motor: No abnormal muscle tone.     Coordination: Coordination normal.     Deep Tendon Reflexes: Reflexes are normal and symmetric.  Psychiatric:        Behavior: Behavior normal.        Thought Content: Thought content normal.        Judgment: Judgment normal.           Assessment & Plan:  Pure hypercholesterolemia - Plan: CBC with Differential/Platelet,  COMPLETE METABOLIC PANEL WITH GFR, Lipid panel  Essential hypertension  Osteoporosis without current pathological fracture, unspecified osteoporosis type  Routine general medical examination at a health care facility  GAD (generalized anxiety disorder)  Physical exam today is outstanding.  Blood pressure is excellent.  Covid shot is up-to-date along with the remainder of her immunizations.  She declines hepatitis C as well as a tetanus shot.  Bone density test is not due for 2 years.  Mammogram is due next year.  Does not require a Pap smear.  Colonoscopy is due in 3 years.  She denies any falls, depression, or memory loss.  Check CMP, CBC, and fasting lipid panel.  I will treat the patient's right-sided sciatica with dexamethasone 0.75 mg twice daily for 6 days.  Recommended a flu shot this fall.

## 2020-02-13 LAB — COMPLETE METABOLIC PANEL WITH GFR
AG Ratio: 1.9 (calc) (ref 1.0–2.5)
ALT: 27 U/L (ref 6–29)
AST: 29 U/L (ref 10–35)
Albumin: 4.4 g/dL (ref 3.6–5.1)
Alkaline phosphatase (APISO): 84 U/L (ref 37–153)
BUN: 8 mg/dL (ref 7–25)
CO2: 32 mmol/L (ref 20–32)
Calcium: 10.4 mg/dL (ref 8.6–10.4)
Chloride: 100 mmol/L (ref 98–110)
Creat: 0.74 mg/dL (ref 0.60–0.93)
GFR, Est African American: 94 mL/min/{1.73_m2} (ref >=60)
GFR, Est Non African American: 81 mL/min/{1.73_m2} (ref >=60)
Globulin: 2.3 g/dL (calc) (ref 1.9–3.7)
Glucose, Bld: 81 mg/dL (ref 65–99)
Potassium: 4.5 mmol/L (ref 3.5–5.3)
Sodium: 138 mmol/L (ref 135–146)
Total Bilirubin: 0.6 mg/dL (ref 0.2–1.2)
Total Protein: 6.7 g/dL (ref 6.1–8.1)

## 2020-02-13 LAB — CBC WITH DIFFERENTIAL/PLATELET
Absolute Monocytes: 494 cells/uL (ref 200–950)
Basophils Absolute: 53 cells/uL (ref 0–200)
Basophils Relative: 1.1 %
Eosinophils Absolute: 72 cells/uL (ref 15–500)
Eosinophils Relative: 1.5 %
HCT: 40.5 % (ref 35.0–45.0)
Hemoglobin: 13.7 g/dL (ref 11.7–15.5)
Lymphs Abs: 1546 cells/uL (ref 850–3900)
MCH: 30.9 pg (ref 27.0–33.0)
MCHC: 33.8 g/dL (ref 32.0–36.0)
MCV: 91.2 fL (ref 80.0–100.0)
MPV: 10.3 fL (ref 7.5–12.5)
Monocytes Relative: 10.3 %
Neutro Abs: 2635 cells/uL (ref 1500–7800)
Neutrophils Relative %: 54.9 %
Platelets: 264 10*3/uL (ref 140–400)
RBC: 4.44 10*6/uL (ref 3.80–5.10)
RDW: 13.2 % (ref 11.0–15.0)
Total Lymphocyte: 32.2 %
WBC: 4.8 10*3/uL (ref 3.8–10.8)

## 2020-02-13 LAB — LIPID PANEL
Cholesterol: 235 mg/dL — ABNORMAL HIGH (ref <200)
HDL: 132 mg/dL (ref >=50)
LDL Cholesterol (Calc): 89 mg/dL (calc)
Non-HDL Cholesterol (Calc): 103 mg/dL (calc) (ref <130)
Total CHOL/HDL Ratio: 1.8 (calc) (ref <5.0)
Triglycerides: 55 mg/dL (ref <150)

## 2020-02-14 DIAGNOSIS — H2513 Age-related nuclear cataract, bilateral: Secondary | ICD-10-CM | POA: Diagnosis not present

## 2020-03-19 DIAGNOSIS — Z23 Encounter for immunization: Secondary | ICD-10-CM | POA: Diagnosis not present

## 2020-03-26 DIAGNOSIS — M791 Myalgia, unspecified site: Secondary | ICD-10-CM | POA: Diagnosis not present

## 2020-03-26 DIAGNOSIS — Z79899 Other long term (current) drug therapy: Secondary | ICD-10-CM | POA: Diagnosis not present

## 2020-03-26 DIAGNOSIS — M25561 Pain in right knee: Secondary | ICD-10-CM | POA: Diagnosis not present

## 2020-03-26 DIAGNOSIS — Z79891 Long term (current) use of opiate analgesic: Secondary | ICD-10-CM | POA: Diagnosis not present

## 2020-03-26 DIAGNOSIS — M4726 Other spondylosis with radiculopathy, lumbar region: Secondary | ICD-10-CM | POA: Diagnosis not present

## 2020-03-26 DIAGNOSIS — G894 Chronic pain syndrome: Secondary | ICD-10-CM | POA: Diagnosis not present

## 2020-03-26 DIAGNOSIS — M4326 Fusion of spine, lumbar region: Secondary | ICD-10-CM | POA: Diagnosis not present

## 2020-04-07 ENCOUNTER — Other Ambulatory Visit: Payer: Self-pay | Admitting: Family Medicine

## 2020-04-08 NOTE — Telephone Encounter (Signed)
Ok to refill??  Last office visit 02/12/2020.  Last refill 07/25/2019, #2 refills.

## 2020-04-29 DIAGNOSIS — M47816 Spondylosis without myelopathy or radiculopathy, lumbar region: Secondary | ICD-10-CM | POA: Diagnosis not present

## 2020-06-05 DIAGNOSIS — M47816 Spondylosis without myelopathy or radiculopathy, lumbar region: Secondary | ICD-10-CM | POA: Diagnosis not present

## 2020-06-05 DIAGNOSIS — M4326 Fusion of spine, lumbar region: Secondary | ICD-10-CM | POA: Diagnosis not present

## 2020-06-18 DIAGNOSIS — M461 Sacroiliitis, not elsewhere classified: Secondary | ICD-10-CM | POA: Diagnosis not present

## 2020-07-02 ENCOUNTER — Other Ambulatory Visit: Payer: Self-pay | Admitting: Family Medicine

## 2020-07-02 DIAGNOSIS — Z1231 Encounter for screening mammogram for malignant neoplasm of breast: Secondary | ICD-10-CM

## 2020-07-04 DIAGNOSIS — M47816 Spondylosis without myelopathy or radiculopathy, lumbar region: Secondary | ICD-10-CM | POA: Diagnosis not present

## 2020-07-04 DIAGNOSIS — M461 Sacroiliitis, not elsewhere classified: Secondary | ICD-10-CM | POA: Diagnosis not present

## 2020-07-04 DIAGNOSIS — M4326 Fusion of spine, lumbar region: Secondary | ICD-10-CM | POA: Diagnosis not present

## 2020-07-25 DIAGNOSIS — Z981 Arthrodesis status: Secondary | ICD-10-CM | POA: Diagnosis not present

## 2020-07-25 DIAGNOSIS — M48061 Spinal stenosis, lumbar region without neurogenic claudication: Secondary | ICD-10-CM | POA: Diagnosis not present

## 2020-07-25 DIAGNOSIS — M419 Scoliosis, unspecified: Secondary | ICD-10-CM | POA: Diagnosis not present

## 2020-08-01 DIAGNOSIS — M5416 Radiculopathy, lumbar region: Secondary | ICD-10-CM | POA: Diagnosis not present

## 2020-08-01 DIAGNOSIS — M47896 Other spondylosis, lumbar region: Secondary | ICD-10-CM | POA: Diagnosis not present

## 2020-08-01 DIAGNOSIS — M4326 Fusion of spine, lumbar region: Secondary | ICD-10-CM | POA: Diagnosis not present

## 2020-08-07 DIAGNOSIS — M5416 Radiculopathy, lumbar region: Secondary | ICD-10-CM | POA: Diagnosis not present

## 2020-08-15 ENCOUNTER — Ambulatory Visit: Payer: Medicare Other

## 2020-08-21 DIAGNOSIS — M5416 Radiculopathy, lumbar region: Secondary | ICD-10-CM | POA: Diagnosis not present

## 2020-08-21 DIAGNOSIS — M47896 Other spondylosis, lumbar region: Secondary | ICD-10-CM | POA: Diagnosis not present

## 2020-08-21 DIAGNOSIS — M4326 Fusion of spine, lumbar region: Secondary | ICD-10-CM | POA: Diagnosis not present

## 2020-08-22 DIAGNOSIS — D225 Melanocytic nevi of trunk: Secondary | ICD-10-CM | POA: Diagnosis not present

## 2020-08-22 DIAGNOSIS — L578 Other skin changes due to chronic exposure to nonionizing radiation: Secondary | ICD-10-CM | POA: Diagnosis not present

## 2020-08-22 DIAGNOSIS — N6459 Other signs and symptoms in breast: Secondary | ICD-10-CM | POA: Diagnosis not present

## 2020-08-22 DIAGNOSIS — L821 Other seborrheic keratosis: Secondary | ICD-10-CM | POA: Diagnosis not present

## 2020-08-22 DIAGNOSIS — D2271 Melanocytic nevi of right lower limb, including hip: Secondary | ICD-10-CM | POA: Diagnosis not present

## 2020-08-22 DIAGNOSIS — L814 Other melanin hyperpigmentation: Secondary | ICD-10-CM | POA: Diagnosis not present

## 2020-08-27 DIAGNOSIS — M5416 Radiculopathy, lumbar region: Secondary | ICD-10-CM | POA: Diagnosis not present

## 2020-09-04 DIAGNOSIS — M47896 Other spondylosis, lumbar region: Secondary | ICD-10-CM | POA: Diagnosis not present

## 2020-09-04 DIAGNOSIS — M5416 Radiculopathy, lumbar region: Secondary | ICD-10-CM | POA: Diagnosis not present

## 2020-09-04 DIAGNOSIS — M4326 Fusion of spine, lumbar region: Secondary | ICD-10-CM | POA: Diagnosis not present

## 2020-09-10 ENCOUNTER — Other Ambulatory Visit: Payer: Self-pay | Admitting: Orthopaedic Surgery

## 2020-09-10 DIAGNOSIS — M4326 Fusion of spine, lumbar region: Secondary | ICD-10-CM

## 2020-09-12 ENCOUNTER — Ambulatory Visit
Admission: RE | Admit: 2020-09-12 | Discharge: 2020-09-12 | Disposition: A | Discharge status: Home / Self Care | Payer: Medicare Other | Source: Ambulatory Visit | Attending: Orthopaedic Surgery | Admitting: Orthopaedic Surgery

## 2020-09-12 ENCOUNTER — Other Ambulatory Visit: Payer: Self-pay

## 2020-09-12 DIAGNOSIS — M545 Low back pain, unspecified: Secondary | ICD-10-CM | POA: Diagnosis not present

## 2020-09-12 DIAGNOSIS — M4326 Fusion of spine, lumbar region: Secondary | ICD-10-CM

## 2020-09-18 ENCOUNTER — Other Ambulatory Visit: Payer: Self-pay

## 2020-09-18 MED ORDER — HYDROCHLOROTHIAZIDE 25 MG PO TABS: 25.0000 mg | ORAL_TABLET | Freq: Every day | ORAL | 2 refills | Status: DC

## 2020-09-18 MED ORDER — SIMVASTATIN 20 MG PO TABS: 20.0000 mg | ORAL_TABLET | Freq: Every day | ORAL | 2 refills | Status: DC

## 2020-09-19 DIAGNOSIS — M47896 Other spondylosis, lumbar region: Secondary | ICD-10-CM | POA: Diagnosis not present

## 2020-09-19 DIAGNOSIS — Z79891 Long term (current) use of opiate analgesic: Secondary | ICD-10-CM | POA: Diagnosis not present

## 2020-09-19 DIAGNOSIS — M4326 Fusion of spine, lumbar region: Secondary | ICD-10-CM | POA: Diagnosis not present

## 2020-09-19 DIAGNOSIS — Z79899 Other long term (current) drug therapy: Secondary | ICD-10-CM | POA: Diagnosis not present

## 2020-09-19 DIAGNOSIS — Z681 Body mass index (BMI) 19 or less, adult: Secondary | ICD-10-CM | POA: Diagnosis not present

## 2020-09-19 DIAGNOSIS — M5416 Radiculopathy, lumbar region: Secondary | ICD-10-CM | POA: Diagnosis not present

## 2020-09-19 DIAGNOSIS — G894 Chronic pain syndrome: Secondary | ICD-10-CM | POA: Diagnosis not present

## 2020-09-25 DIAGNOSIS — M5416 Radiculopathy, lumbar region: Secondary | ICD-10-CM | POA: Diagnosis not present

## 2020-10-01 ENCOUNTER — Ambulatory Visit
Admission: RE | Admit: 2020-10-01 | Discharge: 2020-10-01 | Disposition: A | Discharge status: Home / Self Care | Payer: Medicare Other | Source: Ambulatory Visit | Attending: Family Medicine | Admitting: Family Medicine

## 2020-10-01 ENCOUNTER — Other Ambulatory Visit: Payer: Self-pay

## 2020-10-01 DIAGNOSIS — Z1231 Encounter for screening mammogram for malignant neoplasm of breast: Secondary | ICD-10-CM | POA: Diagnosis not present

## 2020-10-03 DIAGNOSIS — Z23 Encounter for immunization: Secondary | ICD-10-CM | POA: Diagnosis not present

## 2020-10-06 ENCOUNTER — Ambulatory Visit (INDEPENDENT_AMBULATORY_CARE_PROVIDER_SITE_OTHER): Payer: Medicare Other

## 2020-10-06 ENCOUNTER — Other Ambulatory Visit: Payer: Self-pay

## 2020-10-06 ENCOUNTER — Ambulatory Visit
Admission: RE | Admit: 2020-10-06 | Discharge: 2020-10-06 | Disposition: A | Discharge status: Home / Self Care | Payer: Medicare Other | Source: Ambulatory Visit | Attending: Family Medicine | Admitting: Family Medicine

## 2020-10-06 VITALS — BP 130/77 | HR 73 | Temp 97.7°F | Resp 18

## 2020-10-06 DIAGNOSIS — S99922A Unspecified injury of left foot, initial encounter: Secondary | ICD-10-CM

## 2020-10-06 DIAGNOSIS — R2242 Localized swelling, mass and lump, left lower limb: Secondary | ICD-10-CM

## 2020-10-06 DIAGNOSIS — S92255A Nondisplaced fracture of navicular [scaphoid] of left foot, initial encounter for closed fracture: Secondary | ICD-10-CM

## 2020-10-06 DIAGNOSIS — W19XXXA Unspecified fall, initial encounter: Secondary | ICD-10-CM

## 2020-10-06 DIAGNOSIS — S9032XA Contusion of left foot, initial encounter: Secondary | ICD-10-CM | POA: Diagnosis not present

## 2020-10-06 DIAGNOSIS — M7989 Other specified soft tissue disorders: Secondary | ICD-10-CM | POA: Diagnosis not present

## 2020-10-06 NOTE — ED Provider Notes (Signed)
[facility]    CSN: [id number] Arrival date & time: 10/06/20  1237      History   Chief Complaint No chief complaint on file.   HPI Katelyn [name] is a 75 y.o. female.   HPI Patient presents with ongoing left foot pain following an injury in which she tripped fell with the dorsum of her foot hitting her coffee table x 10 days ago. Foot was initially swollen and bruised following injury , now bruising localized to base of 3rd through 4th toe. Pain is exacerbated by walking and flexion and extending foot. No loss of sensation.  Past Medical History:  Diagnosis Date  . Anxiety   . Arthritis    cervical and lumbar DDD  . Heart murmur   . Hyperlipidemia   . Hypertension   . Osteoporosis     Patient Active Problem List   Diagnosis Date Noted  . 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 Migraines 12/02/2015  . Chronic pain syndrome 09/16/2015  . GAD (generalized anxiety disorder) 09/11/2015  . HTN (hypertension) 03/15/2015  . HLD (hyperlipidemia) 03/15/2015  . Osteoporosis 03/15/2015  . Arthritis 03/15/2015    Past Surgical History:  Procedure Laterality Date  . BUNIONECTOMY     right  . CERVICAL FUSION     x3  . COLONOSCOPY    . POLYPECTOMY    . radiofrequency ablasion    . SPINE SURGERY      Dr. Patrice Paradise (x2, last Nov/15) C3-T4  . UPPER GASTROINTESTINAL ENDOSCOPY      OB History   No obstetric history on file.      Home Medications    Prior to Admission medications   Medication Sig Start Date End Date Taking? Authorizing Provider  alendronate (FOSAMAX) 70 MG tablet TAKE 1 TABLET BY MOUTH EVERY 7 DAYS WITH A FULL GLASS OF WATER ON AN EMPTY STOMACH. 01/08/20   Susy Frizzle, MD  ALPRAZolam Duanne Moron) 0.5 MG tablet TAKE 1 TABLET BY MOUTH TWICE A DAY AS NEEDED FOR ANXIETY 04/08/20   Susy Frizzle, MD  Ascorbic Acid (VITAMIN C) 500 MG tablet Take 500 mg by mouth daily.      [provider]  aspirin 81 MG  tablet Take 81 mg by mouth daily.      [provider]  B Complex Vitamins (VITAMIN B COMPLEX PO) Take 1 tablet by mouth daily.      [provider]  calcium carbonate (OS-CAL) 600 MG TABS Take 600 mg by mouth 2 (two) times daily with a meal.      [provider]  dexamethasone (DECADRON) 0.75 MG tablet Take 1 tablet (0.75 mg total) by mouth 2 (two) times daily with a meal. 02/12/20   Susy Frizzle, MD  diclofenac sodium (VOLTAREN) 1 % GEL Apply 2 g topically 4 (four) times daily.    [provider]  DULoxetine (CYMBALTA) 30 MG capsule Take 1 capsule (30 mg total) by mouth 2 (two) times daily. 09/08/17   Susy Frizzle, MD  fish oil-omega-3 fatty acids 1000 MG capsule Take 1 g by mouth 2 (two) times daily.      [provider]  Garlic 6629 MG CAPS Take 1 capsule by mouth daily.      [provider]  glucosamine-chondroitin 500-400 MG tablet Take 1 tablet by mouth daily.      [provider]  hydrochlorothiazide (HYDRODIURIL) 25 MG tablet Take 1 tablet (25 mg total) by mouth  daily. 09/18/20   Susy Frizzle, MD  Lysine 500 MG CAPS Take 1 capsule by mouth daily.      [provider]  magnesium oxide (MAG-OX) 400 MG tablet Take 400 mg by mouth daily.      [provider]  Multiple Vitamins-Minerals (MULTIVITAMIN WITH MINERALS) tablet Take 1 tablet by mouth daily.      [provider]  nortriptyline (PAMELOR) 10 MG capsule 10 mg. 3 tab po QAM - 3 tab po QPM    [provider]  POTASSIUM GLUCONATE PO Take 1 tablet by mouth daily.      [provider]  simvastatin (ZOCOR) 20 MG tablet Take 1 tablet (20 mg total) by mouth at bedtime. 09/18/20   Susy Frizzle, MD  SUMAtriptan (IMITREX) 100 MG tablet TAKE 1/2 TABLET BY MOUTH EVERY 2 HOURS AS NEEDED FOR MIGRAINE OR HEADACHE Patient not taking: Reported on 01/25/2020 10/20/19   Susy Frizzle, MD  traMADol (ULTRAM) 50 MG tablet Take 50 mg by  mouth every 8 (eight) hours as needed. for pain 10/22/17   [provider]  vitamin E 400 UNIT capsule Take 400 Units by mouth daily.      [provider]  zolpidem (AMBIEN) 5 MG tablet TAKE 1 TABLET BY MOUTH AT BEDTIME AS NEEDED FOR SLEEP Patient not taking: Reported on 01/25/2020 01/24/18   Susy Frizzle, MD    Family History Family History  Problem Relation Age of Onset  . Colon polyps Mother   . Arthritis Mother   . Hearing loss Mother   . Hyperlipidemia Mother   . Hypertension Mother   . Stroke Mother   . Heart disease Mother   . Colon polyps Father   . Arthritis Father   . Heart disease Father   . Hyperlipidemia Father   . Arthritis Maternal Grandmother   . Arthritis Maternal Grandfather   . Arthritis Paternal Grandmother   . Arthritis Paternal Grandfather   . Colon cancer Neg Hx   . Esophageal cancer Neg Hx   . Stomach cancer Neg Hx   . Rectal cancer Neg Hx     Social History Social History   Tobacco Use  . Smoking status: Never Smoker  . Smokeless tobacco: Never Used  Vaping Use  . Vaping Use: Never used  Substance Use Topics  . Alcohol use: Yes    Alcohol/week: 7.0 standard drinks    Types: 7 Glasses of wine per week    Comment: 1-2 rare  . Drug use: No     Allergies   Iodine, Prednisone, Sulfa antibiotics, Hydrocodone, and Red dye   Review of Systems Review of Systems Pertinent negatives listed in HPI Physical Exam Triage Vital Signs ED Triage Vitals  Enc Vitals Group     BP      Pulse      Resp      Temp      Temp src      SpO2      Weight      Height      Head Circumference      Peak Flow      Pain Score      Pain Loc      Pain Edu?      Excl. in [place]?    No data found.  Updated Vital Signs BP 130/77 (BP Location: Right Arm)   Pulse 73   Temp 97.7 F (36.5 C) (Oral)   Resp 18  SpO2 96%   Visual Acuity Right Eye Distance:   Left Eye Distance:   Bilateral Distance:    Right Eye Near:   Left Eye Near:     Bilateral Near:     Physical Exam General appearance: alert, well developed, well nourished, cooperative Head: Normocephalic, without obvious abnormality, atraumatic Respiratory: Respirations even and unlabored, normal respiratory rate Heart: Rate and rhythm normal.  Extremities: Left foot: ecchymosis 3rd and 4th metatarsal and base of toe, tenderness navicular region or left foot, no gross deformities. Cap refill <2, sensation inact  Skin: Skin color, texture, turgor normal. No rashes seen  Psych: Appropriate mood and affect.  UC Treatments / Results  Labs (all labs ordered are listed, but only abnormal results are displayed) Labs Reviewed - No data to display  EKG   Radiology DG Foot Complete Left  Result Date: 10/06/2020 CLINICAL DATA:  Status post fall 10 days ago with bruising swelling of the left foot. EXAM: LEFT FOOT - COMPLETE 3+ VIEW COMPARISON:  None. FINDINGS: There is question cortical discontinuity of the superior cortex of the navicular bone, if the patient has focal pain here fracture is not excluded. No other acute fracture or dislocation is identified. Diffuse osteopenia is noted. IMPRESSION: There is question cortical discontinuity of the superior cortex of the navicular bone, if the patient has focal pain here fracture is not excluded. Electronically Signed   By: Abelardo Diesel M.D.   On: 10/06/2020 13:52    Procedures Procedures (including critical care time)  Medications Ordered in UC Medications - No data to display  Initial Impression / Assessment and Plan / UC Course  I have reviewed the triage vital signs and the nursing notes.  Pertinent labs & imaging results that were available during my care of the patient were reviewed by me and considered in my medical decision making (see chart for details).    Acute left foot tarsal navicular fracture . Post op shoe applied to left foot  Continue (home)  Tramadol for pain. Orthopedics follow-up per discharge  instruction Final Clinical Impressions(s) / UC Diagnoses   Final diagnoses:  Foot injury, left, initial encounter  Closed nondisplaced fracture of navicular bone of left foot, initial encounter     Discharge Instructions     Continue Tramadol for pain. Follow-up with orthopedics for further evaluation of fracture. Continue wear of post op shoe with all weight bearing activities until seen by orthopedics     ED Prescriptions    None     PDMP not reviewed this encounter.   Scot Jun, [place] 10/10/20 [phone]

## 2020-10-06 NOTE — Discharge Instructions (Addendum)
Continue Tramadol for pain. Follow-up with orthopedics for further evaluation of fracture. Continue wear of post op shoe with all weight bearing activities until seen by orthopedics

## 2020-10-06 NOTE — ED Triage Notes (Signed)
Fell 10 days ago due to left leg numb.  left foot is bruised and swollen.

## 2020-10-07 DIAGNOSIS — S93602A Unspecified sprain of left foot, initial encounter: Secondary | ICD-10-CM | POA: Diagnosis not present

## 2020-10-23 DIAGNOSIS — M5416 Radiculopathy, lumbar region: Secondary | ICD-10-CM | POA: Diagnosis not present

## 2020-10-23 DIAGNOSIS — M47896 Other spondylosis, lumbar region: Secondary | ICD-10-CM | POA: Diagnosis not present

## 2020-10-23 DIAGNOSIS — M4326 Fusion of spine, lumbar region: Secondary | ICD-10-CM | POA: Diagnosis not present

## 2020-11-29 DIAGNOSIS — M5416 Radiculopathy, lumbar region: Secondary | ICD-10-CM | POA: Diagnosis not present

## 2020-11-29 DIAGNOSIS — I10 Essential (primary) hypertension: Secondary | ICD-10-CM | POA: Diagnosis not present

## 2020-11-29 DIAGNOSIS — M47896 Other spondylosis, lumbar region: Secondary | ICD-10-CM | POA: Diagnosis not present

## 2020-11-29 DIAGNOSIS — M4326 Fusion of spine, lumbar region: Secondary | ICD-10-CM | POA: Diagnosis not present

## 2020-12-09 ENCOUNTER — Other Ambulatory Visit: Payer: Self-pay | Admitting: *Deleted

## 2020-12-09 MED ORDER — SUMATRIPTAN SUCCINATE 100 MG PO TABS: ORAL_TABLET | ORAL | 3 refills | Status: DC

## 2020-12-09 NOTE — Telephone Encounter (Signed)
Received fax requesting refill on Xanax.   Ok to refill??  Last office visit 02/12/2020.  Last refill 04/08/2020, #2 refills.

## 2020-12-10 ENCOUNTER — Other Ambulatory Visit: Payer: Self-pay

## 2020-12-10 ENCOUNTER — Other Ambulatory Visit: Payer: Self-pay | Admitting: *Deleted

## 2020-12-10 MED ORDER — SUMATRIPTAN SUCCINATE 100 MG PO TABS: 100.0000 mg | ORAL_TABLET | Freq: Every day | ORAL | 3 refills | Status: DC | PRN

## 2020-12-10 MED ORDER — ALPRAZOLAM 0.5 MG PO TABS: 0.5000 mg | ORAL_TABLET | Freq: Two times a day (BID) | ORAL | 2 refills | Status: DC | PRN

## 2020-12-10 MED ORDER — SUMATRIPTAN SUCCINATE 100 MG PO TABS: ORAL_TABLET | ORAL | 3 refills | Status: DC

## 2020-12-31 DIAGNOSIS — M47816 Spondylosis without myelopathy or radiculopathy, lumbar region: Secondary | ICD-10-CM | POA: Diagnosis not present

## 2021-01-29 DIAGNOSIS — H2513 Age-related nuclear cataract, bilateral: Secondary | ICD-10-CM | POA: Diagnosis not present

## 2021-01-29 DIAGNOSIS — M47816 Spondylosis without myelopathy or radiculopathy, lumbar region: Secondary | ICD-10-CM | POA: Diagnosis not present

## 2021-01-29 DIAGNOSIS — M4326 Fusion of spine, lumbar region: Secondary | ICD-10-CM | POA: Diagnosis not present

## 2021-02-12 DIAGNOSIS — M47816 Spondylosis without myelopathy or radiculopathy, lumbar region: Secondary | ICD-10-CM | POA: Diagnosis not present

## 2021-02-12 DIAGNOSIS — M4326 Fusion of spine, lumbar region: Secondary | ICD-10-CM | POA: Diagnosis not present

## 2021-02-14 ENCOUNTER — Other Ambulatory Visit: Payer: Self-pay

## 2021-02-14 ENCOUNTER — Ambulatory Visit (INDEPENDENT_AMBULATORY_CARE_PROVIDER_SITE_OTHER): Payer: Medicare Other | Admitting: Family Medicine

## 2021-02-14 ENCOUNTER — Encounter: Payer: Self-pay | Admitting: Family Medicine

## 2021-02-14 VITALS — BP 110/64 | HR 80 | Temp 98.2°F | Resp 14 | Ht 63.0 in | Wt 106.0 lb

## 2021-02-14 DIAGNOSIS — M47816 Spondylosis without myelopathy or radiculopathy, lumbar region: Secondary | ICD-10-CM

## 2021-02-14 DIAGNOSIS — Z981 Arthrodesis status: Secondary | ICD-10-CM | POA: Diagnosis not present

## 2021-02-14 DIAGNOSIS — E7800 Pure hypercholesterolemia, unspecified: Secondary | ICD-10-CM

## 2021-02-14 DIAGNOSIS — M810 Age-related osteoporosis without current pathological fracture: Secondary | ICD-10-CM | POA: Diagnosis not present

## 2021-02-14 DIAGNOSIS — Z0000 Encounter for general adult medical examination without abnormal findings: Secondary | ICD-10-CM

## 2021-02-14 DIAGNOSIS — F411 Generalized anxiety disorder: Secondary | ICD-10-CM

## 2021-02-14 DIAGNOSIS — G894 Chronic pain syndrome: Secondary | ICD-10-CM

## 2021-02-14 DIAGNOSIS — Z0001 Encounter for general adult medical examination with abnormal findings: Secondary | ICD-10-CM | POA: Diagnosis not present

## 2021-02-14 LAB — LIPID PANEL
Cholesterol: 248 mg/dL — ABNORMAL HIGH (ref <200)
HDL: 124 mg/dL (ref >=50)
LDL Cholesterol (Calc): 108 mg/dL (calc) — ABNORMAL HIGH
Non-HDL Cholesterol (Calc): 124 mg/dL (calc) (ref <130)
Total CHOL/HDL Ratio: 2 (calc) (ref <5.0)
Triglycerides: 70 mg/dL (ref <150)

## 2021-02-14 LAB — COMPLETE METABOLIC PANEL WITH GFR
AG Ratio: 2 (calc) (ref 1.0–2.5)
ALT: 40 U/L — ABNORMAL HIGH (ref 6–29)
AST: 33 U/L (ref 10–35)
Albumin: 4.4 g/dL (ref 3.6–5.1)
Alkaline phosphatase (APISO): 63 U/L (ref 37–153)
BUN: 7 mg/dL (ref 7–25)
CO2: 32 mmol/L (ref 20–32)
Calcium: 10 mg/dL (ref 8.6–10.4)
Chloride: 97 mmol/L — ABNORMAL LOW (ref 98–110)
Creat: 0.65 mg/dL (ref 0.60–1.00)
Globulin: 2.2 g/dL (calc) (ref 1.9–3.7)
Glucose, Bld: 99 mg/dL (ref 65–99)
Potassium: 4.4 mmol/L (ref 3.5–5.3)
Sodium: 134 mmol/L — ABNORMAL LOW (ref 135–146)
Total Bilirubin: 0.5 mg/dL (ref 0.2–1.2)
Total Protein: 6.6 g/dL (ref 6.1–8.1)
eGFR: 93 mL/min/{1.73_m2} (ref >=60)

## 2021-02-14 LAB — CBC WITH DIFFERENTIAL/PLATELET
Absolute Monocytes: 475 cells/uL (ref 200–950)
Basophils Absolute: 38 cells/uL (ref 0–200)
Basophils Relative: 0.8 %
Eosinophils Absolute: 99 cells/uL (ref 15–500)
Eosinophils Relative: 2.1 %
HCT: 39.7 % (ref 35.0–45.0)
Hemoglobin: 13.4 g/dL (ref 11.7–15.5)
Lymphs Abs: 1481 cells/uL (ref 850–3900)
MCH: 31.2 pg (ref 27.0–33.0)
MCHC: 33.8 g/dL (ref 32.0–36.0)
MCV: 92.3 fL (ref 80.0–100.0)
MPV: 10.3 fL (ref 7.5–12.5)
Monocytes Relative: 10.1 %
Neutro Abs: 2609 cells/uL (ref 1500–7800)
Neutrophils Relative %: 55.5 %
Platelets: 262 10*3/uL (ref 140–400)
RBC: 4.3 10*6/uL (ref 3.80–5.10)
RDW: 13.1 % (ref 11.0–15.0)
Total Lymphocyte: 31.5 %
WBC: 4.7 10*3/uL (ref 3.8–10.8)

## 2021-02-14 NOTE — Progress Notes (Signed)
Subjective:    Patient ID: Katelyn [name], female    DOB: 1946/04/16, 75 y.o.   MRN: [id number]  HPI Patient is a very pleasant 75 year old Caucasian female here today for complete physical exam.  She had a colonoscopy in 8/21 that revealed 3 tubular adenomas.  She is due for repeat colonoscopy in 3 years.  She had a bone density test performed 07/2019 that showed a T score of -3.  She is on Fosamax, calcium, vitamin D.  Mammogram was normal on 09/2020.  She denies any falls, depression, or memory loss.  She is due for Shingrix and a flu shot. The remainder of her immunizations are up-to-date: Immunization History  Administered Date(s) Administered   Influenza, High Dose Seasonal PF 02/02/2019   PFIZER(Purple Top)SARS-COV-2 Vaccination 08/14/2019, 09/04/2019, 03/19/2020   Pneumococcal Conjugate-13 10/17/2015   Pneumococcal Polysaccharide-23 09/24/2014   Unspecified SARS-COV-2 Vaccination 08/14/2019   Zoster, Live 06/21/2013   Patient is taking a baby aspirin every day.  She has no history of heart disease or stroke or peripheral vascular disease.  Therefore there is not an indication for her to be on an aspirin.  We discussed the recent study showing lack of benefit for female patients and taking a daily aspirin.  I am fine with her stopping that.  Unfortunately she has surgery pending.  She is going to have to have a laminectomy performed between the second and fourth lumbar vertebrae and a fusion.  She is continuing to have pain in her lower back and she is hoping that this will help alleviate some of the pain.  Aside from that, she denies any chest pain shortness of breath dyspnea on exertion orthopnea or paroxysmal nocturnal dyspnea.  Her cardiovascular exam today is completely normal  Past Medical History:  Diagnosis Date   Anxiety    Arthritis    cervical and lumbar DDD   Heart murmur    Hyperlipidemia    Hypertension    Osteoporosis    Past Surgical History:  Procedure Laterality  Date   BUNIONECTOMY     right   CERVICAL FUSION     x3   COLONOSCOPY     POLYPECTOMY     radiofrequency ablasion     SPINE SURGERY      Dr. Patrice Paradise (x2, last Nov/15) C3-T4   UPPER GASTROINTESTINAL ENDOSCOPY     Current Outpatient Medications on File Prior to Visit  Medication Sig Dispense Refill   alendronate (FOSAMAX) 70 MG tablet TAKE 1 TABLET BY MOUTH EVERY 7 DAYS WITH A FULL GLASS OF WATER ON AN EMPTY STOMACH. 12 tablet 3   ALPRAZolam (XANAX) 0.5 MG tablet Take 1 tablet (0.5 mg total) by mouth 2 (two) times daily as needed for anxiety. 60 tablet 2   Ascorbic Acid (VITAMIN C) 500 MG tablet Take 500 mg by mouth daily.       aspirin 81 MG tablet Take 81 mg by mouth daily.       B Complex Vitamins (VITAMIN B COMPLEX PO) Take 1 tablet by mouth daily.       calcium carbonate (OS-CAL) 600 MG TABS Take 600 mg by mouth 2 (two) times daily with a meal.       dexamethasone (DECADRON) 0.75 MG tablet Take 1 tablet (0.75 mg total) by mouth 2 (two) times daily with a meal. 12 tablet 0   diclofenac sodium (VOLTAREN) 1 % GEL Apply 2 g topically 4 (four) times daily.     DULoxetine (CYMBALTA) 30 MG  capsule Take 1 capsule (30 mg total) by mouth 2 (two) times daily. 180 capsule 3   fish oil-omega-3 fatty acids 1000 MG capsule Take 1 g by mouth 2 (two) times daily.       Garlic 123XX123 MG CAPS Take 1 capsule by mouth daily.       glucosamine-chondroitin 500-400 MG tablet Take 1 tablet by mouth daily.       hydrochlorothiazide (HYDRODIURIL) 25 MG tablet Take 1 tablet (25 mg total) by mouth daily. 90 tablet 2   Lysine 500 MG CAPS Take 1 capsule by mouth daily.       magnesium oxide (MAG-OX) 400 MG tablet Take 400 mg by mouth daily.       Multiple Vitamins-Minerals (MULTIVITAMIN WITH MINERALS) tablet Take 1 tablet by mouth daily.       nortriptyline (PAMELOR) 10 MG capsule 10 mg. 3 tab po QAM - 3 tab po QPM     POTASSIUM GLUCONATE PO Take 1 tablet by mouth daily.       simvastatin (ZOCOR) 20 MG tablet Take 1  tablet (20 mg total) by mouth at bedtime. 90 tablet 2   SUMAtriptan (IMITREX) 100 MG tablet Take 1 tablet (100 mg total) by mouth daily as needed for migraine. May repeat in 2 hours if headache persists or recurs. 30 tablet 3   traMADol (ULTRAM) 50 MG tablet Take 50 mg by mouth every 8 (eight) hours as needed. for pain  0   vitamin E 400 UNIT capsule Take 400 Units by mouth daily.       zolpidem (AMBIEN) 5 MG tablet TAKE 1 TABLET BY MOUTH AT BEDTIME AS NEEDED FOR SLEEP (Patient not taking: Reported on 01/25/2020) 30 tablet 3   No current facility-administered medications on file prior to visit.   Allergies  Allergen Reactions   Iodine Hives   Prednisone Hives    Pt took Benadryl with Prednisone for surgery and did ok with it   Sulfa Antibiotics Swelling   Hydrocodone Palpitations   Red Dye Rash   Social History   Socioeconomic History   Marital status: Married    Spouse name: [name]   Number of children: 2   Years of education: College   Highest education level: Not on file  Occupational History   Occupation: Retired  Tobacco Use   Smoking status: Never   Smokeless tobacco: Never  Vaping Use   Vaping Use: Never used  Substance and Sexual Activity   Alcohol use: Yes    Alcohol/week: 7.0 standard drinks    Types: 7 Glasses of wine per week    Comment: 1-2 rare   Drug use: No   Sexual activity: Yes  Other Topics Concern   Not on file  Social History Narrative   Lives with husband   Caffeine use: none   Social Determinants of Health   Financial Resource Strain: Not on file  Food Insecurity: Not on file  Transportation Needs: Not on file  Physical Activity: Not on file  Stress: Not on file  Social Connections: Not on file  Intimate Partner Violence: Not on file   Family History  Problem Relation Age of Onset   Colon polyps Mother    Arthritis Mother    Hearing loss Mother    Hyperlipidemia Mother    Hypertension Mother    Stroke Mother    Heart disease Mother     Colon polyps Father    Arthritis Father    Heart disease Father  Hyperlipidemia Father    Arthritis Maternal Grandmother    Arthritis Maternal Grandfather    Arthritis Paternal Grandmother    Arthritis Paternal Grandfather    Colon cancer Neg Hx    Esophageal cancer Neg Hx    Stomach cancer Neg Hx    Rectal cancer Neg Hx       Review of Systems  All other systems reviewed and are negative.     Objective:   Physical Exam Vitals reviewed.  Constitutional:      General: She is not in acute distress.    Appearance: She is well-developed. She is not diaphoretic.  HENT:     Head: Normocephalic and atraumatic.     Right Ear: External ear normal.     Left Ear: External ear normal.     Nose: Nose normal.     Mouth/Throat:     Pharynx: No oropharyngeal exudate.  Eyes:     General: No scleral icterus.       Right eye: No discharge.        Left eye: No discharge.     Conjunctiva/sclera: Conjunctivae normal.     Pupils: Pupils are equal, round, and reactive to light.  Neck:     Thyroid: No thyromegaly.     Vascular: No JVD.     Trachea: No tracheal deviation.  Cardiovascular:     Rate and Rhythm: Normal rate and regular rhythm.     Heart sounds: No murmur heard.   No friction rub. No gallop.  Pulmonary:     Effort: Pulmonary effort is normal. No respiratory distress.     Breath sounds: Normal breath sounds. No stridor. No wheezing or rales.  Chest:     Chest wall: No tenderness.  Abdominal:     General: Bowel sounds are normal. There is no distension.     Palpations: Abdomen is soft. There is no mass.     Tenderness: There is no abdominal tenderness. There is no guarding or rebound.  Musculoskeletal:        General: No tenderness. Normal range of motion.     Cervical back: Normal range of motion and neck supple.  Lymphadenopathy:     Cervical: No cervical adenopathy.  Skin:    General: Skin is warm.     Coloration: Skin is not pale.     Findings: No erythema or  rash.  Neurological:     Mental Status: She is alert and oriented to person, place, and time.     Cranial Nerves: No cranial nerve deficit.     Motor: No abnormal muscle tone.     Coordination: Coordination normal.     Deep Tendon Reflexes: Reflexes are normal and symmetric.  Psychiatric:        Behavior: Behavior normal.        Thought Content: Thought content normal.        Judgment: Judgment normal.          Assessment & Plan:  Routine general medical examination at a health care facility  Osteoporosis without current pathological fracture, unspecified osteoporosis type  Pure hypercholesterolemia - Plan: COMPLETE METABOLIC PANEL WITH GFR, Lipid panel, CBC with Differential/Platelet  GAD (generalized anxiety disorder)  Chronic pain syndrome  Lumbar spondylosis  History of fusion of cervical spine I will check a CBC, CMP, fasting lipid panel.  Barring any abnormality seen on her lab work, I see no contraindications to her upcoming surgery and I filled out a form for surgical clearance.  I recommended stopping an aspirin 1 week prior to surgery.  I would continue Fosamax for her osteoporosis along with calcium and vitamin D.  She is due to repeat a bone density in 2024.  She is due to repeat her colonoscopy due to a history of tubular adenomas in 2024.  Her mammogram is due again next year in April.  Otherwise her preventative care is up-to-date.  I did recommend Shingrix and a flu shot.

## 2021-02-18 DIAGNOSIS — Z23 Encounter for immunization: Secondary | ICD-10-CM | POA: Diagnosis not present

## 2021-03-09 ENCOUNTER — Other Ambulatory Visit: Payer: Self-pay | Admitting: Family Medicine

## 2021-03-14 DIAGNOSIS — M532X6 Spinal instabilities, lumbar region: Secondary | ICD-10-CM | POA: Diagnosis not present

## 2021-03-14 DIAGNOSIS — M4716 Other spondylosis with myelopathy, lumbar region: Secondary | ICD-10-CM | POA: Diagnosis not present

## 2021-03-14 DIAGNOSIS — M4150 Other secondary scoliosis, site unspecified: Secondary | ICD-10-CM | POA: Diagnosis not present

## 2021-03-14 DIAGNOSIS — M4326 Fusion of spine, lumbar region: Secondary | ICD-10-CM | POA: Diagnosis not present

## 2021-03-14 DIAGNOSIS — M960 Pseudarthrosis after fusion or arthrodesis: Secondary | ICD-10-CM | POA: Diagnosis not present

## 2021-03-17 DIAGNOSIS — Z01812 Encounter for preprocedural laboratory examination: Secondary | ICD-10-CM | POA: Diagnosis not present

## 2021-03-17 DIAGNOSIS — M961 Postlaminectomy syndrome, not elsewhere classified: Secondary | ICD-10-CM | POA: Diagnosis not present

## 2021-03-17 DIAGNOSIS — Z01810 Encounter for preprocedural cardiovascular examination: Secondary | ICD-10-CM | POA: Diagnosis not present

## 2021-03-18 DIAGNOSIS — I4510 Unspecified right bundle-branch block: Secondary | ICD-10-CM | POA: Diagnosis not present

## 2021-03-18 DIAGNOSIS — I440 Atrioventricular block, first degree: Secondary | ICD-10-CM | POA: Diagnosis not present

## 2021-03-25 DIAGNOSIS — Z79899 Other long term (current) drug therapy: Secondary | ICD-10-CM | POA: Diagnosis not present

## 2021-03-25 DIAGNOSIS — M5416 Radiculopathy, lumbar region: Secondary | ICD-10-CM | POA: Diagnosis not present

## 2021-03-25 DIAGNOSIS — M4322 Fusion of spine, cervical region: Secondary | ICD-10-CM | POA: Diagnosis not present

## 2021-03-25 DIAGNOSIS — M960 Pseudarthrosis after fusion or arthrodesis: Secondary | ICD-10-CM | POA: Diagnosis not present

## 2021-03-25 DIAGNOSIS — F419 Anxiety disorder, unspecified: Secondary | ICD-10-CM | POA: Diagnosis not present

## 2021-03-25 DIAGNOSIS — M4150 Other secondary scoliosis, site unspecified: Secondary | ICD-10-CM | POA: Diagnosis not present

## 2021-03-25 DIAGNOSIS — M4716 Other spondylosis with myelopathy, lumbar region: Secondary | ICD-10-CM | POA: Diagnosis not present

## 2021-03-25 DIAGNOSIS — Z91041 Radiographic dye allergy status: Secondary | ICD-10-CM | POA: Diagnosis not present

## 2021-03-25 DIAGNOSIS — Z981 Arthrodesis status: Secondary | ICD-10-CM | POA: Diagnosis not present

## 2021-03-25 DIAGNOSIS — M810 Age-related osteoporosis without current pathological fracture: Secondary | ICD-10-CM | POA: Diagnosis not present

## 2021-03-25 DIAGNOSIS — M4326 Fusion of spine, lumbar region: Secondary | ICD-10-CM | POA: Diagnosis not present

## 2021-03-25 DIAGNOSIS — G43909 Migraine, unspecified, not intractable, without status migrainosus: Secondary | ICD-10-CM | POA: Diagnosis not present

## 2021-03-25 DIAGNOSIS — I10 Essential (primary) hypertension: Secondary | ICD-10-CM | POA: Diagnosis not present

## 2021-03-25 DIAGNOSIS — M48062 Spinal stenosis, lumbar region with neurogenic claudication: Secondary | ICD-10-CM | POA: Diagnosis not present

## 2021-03-25 DIAGNOSIS — M961 Postlaminectomy syndrome, not elsewhere classified: Secondary | ICD-10-CM | POA: Diagnosis not present

## 2021-04-24 DIAGNOSIS — M545 Low back pain, unspecified: Secondary | ICD-10-CM | POA: Diagnosis not present

## 2021-06-05 ENCOUNTER — Other Ambulatory Visit: Payer: Self-pay | Admitting: Family Medicine

## 2021-06-10 ENCOUNTER — Other Ambulatory Visit: Payer: Self-pay | Admitting: Family Medicine

## 2021-06-25 DIAGNOSIS — Z681 Body mass index (BMI) 19 or less, adult: Secondary | ICD-10-CM | POA: Diagnosis not present

## 2021-06-25 DIAGNOSIS — M4326 Fusion of spine, lumbar region: Secondary | ICD-10-CM | POA: Diagnosis not present

## 2021-07-13 ENCOUNTER — Other Ambulatory Visit: Payer: Self-pay | Admitting: Family Medicine

## 2021-07-14 ENCOUNTER — Other Ambulatory Visit: Payer: Self-pay

## 2021-07-14 MED ORDER — ALPRAZOLAM 0.5 MG PO TABS: 0.5000 mg | ORAL_TABLET | Freq: Two times a day (BID) | ORAL | 0 refills | Status: DC | PRN

## 2021-07-14 NOTE — Telephone Encounter (Signed)
Ambien refill request.  Last seen 02/14/2021, last filled 01/24/2018.  Needs appointment

## 2021-07-14 NOTE — Telephone Encounter (Signed)
Pt called to request refill on Xanax. She is asking for 90D supply.  LOV 02/14/21 Last refill 06/10/21, #60, 0 refills  Please review, thanks!

## 2021-07-29 DIAGNOSIS — Z23 Encounter for immunization: Secondary | ICD-10-CM | POA: Diagnosis not present

## 2021-08-20 ENCOUNTER — Other Ambulatory Visit: Payer: Self-pay | Admitting: Family Medicine

## 2021-08-20 DIAGNOSIS — Z1231 Encounter for screening mammogram for malignant neoplasm of breast: Secondary | ICD-10-CM

## 2021-08-26 ENCOUNTER — Telehealth: Payer: Self-pay

## 2021-08-26 DIAGNOSIS — H9190 Unspecified hearing loss, unspecified ear: Secondary | ICD-10-CM

## 2021-08-26 NOTE — Telephone Encounter (Signed)
Patient called to ask for referral to audiologist for hearing test. Referral placed. Patient aware someone will contact her to schedule.  ?

## 2021-08-27 ENCOUNTER — Other Ambulatory Visit: Payer: Self-pay | Admitting: Family Medicine

## 2021-08-28 DIAGNOSIS — D225 Melanocytic nevi of trunk: Secondary | ICD-10-CM | POA: Diagnosis not present

## 2021-08-28 DIAGNOSIS — L723 Sebaceous cyst: Secondary | ICD-10-CM | POA: Diagnosis not present

## 2021-08-28 DIAGNOSIS — D2271 Melanocytic nevi of right lower limb, including hip: Secondary | ICD-10-CM | POA: Diagnosis not present

## 2021-08-28 DIAGNOSIS — Z23 Encounter for immunization: Secondary | ICD-10-CM | POA: Diagnosis not present

## 2021-08-28 DIAGNOSIS — L814 Other melanin hyperpigmentation: Secondary | ICD-10-CM | POA: Diagnosis not present

## 2021-08-28 DIAGNOSIS — L578 Other skin changes due to chronic exposure to nonionizing radiation: Secondary | ICD-10-CM | POA: Diagnosis not present

## 2021-09-25 DIAGNOSIS — M791 Myalgia, unspecified site: Secondary | ICD-10-CM | POA: Diagnosis not present

## 2021-09-25 DIAGNOSIS — M4326 Fusion of spine, lumbar region: Secondary | ICD-10-CM | POA: Diagnosis not present

## 2021-09-25 DIAGNOSIS — G894 Chronic pain syndrome: Secondary | ICD-10-CM | POA: Diagnosis not present

## 2021-09-25 DIAGNOSIS — M4322 Fusion of spine, cervical region: Secondary | ICD-10-CM | POA: Diagnosis not present

## 2021-09-25 DIAGNOSIS — M542 Cervicalgia: Secondary | ICD-10-CM | POA: Diagnosis not present

## 2021-09-25 DIAGNOSIS — Z681 Body mass index (BMI) 19 or less, adult: Secondary | ICD-10-CM | POA: Diagnosis not present

## 2021-09-25 DIAGNOSIS — Z79891 Long term (current) use of opiate analgesic: Secondary | ICD-10-CM | POA: Diagnosis not present

## 2021-09-25 DIAGNOSIS — Z79899 Other long term (current) drug therapy: Secondary | ICD-10-CM | POA: Diagnosis not present

## 2021-10-02 ENCOUNTER — Ambulatory Visit: Payer: Medicare Other | Attending: Family Medicine | Admitting: Audiologist

## 2021-10-02 ENCOUNTER — Ambulatory Visit
Admission: RE | Admit: 2021-10-02 | Discharge: 2021-10-02 | Disposition: A | Discharge status: Home / Self Care | Payer: Medicare Other | Source: Ambulatory Visit | Attending: Family Medicine | Admitting: Family Medicine

## 2021-10-02 DIAGNOSIS — Z1231 Encounter for screening mammogram for malignant neoplasm of breast: Secondary | ICD-10-CM | POA: Diagnosis not present

## 2021-10-02 DIAGNOSIS — H903 Sensorineural hearing loss, bilateral: Secondary | ICD-10-CM | POA: Insufficient documentation

## 2021-10-02 NOTE — Procedures (Signed)
?  [facility] [place] ?[address] ?[place], [place]  [address] ?[phone] ? ?AUDIOLOGICAL  EVALUATION ? ?NAME: Katelyn [name]     ?DOB:   Aug 24, 1945      ?MRN: [id number]                                                                                     ?DATE: 10/02/2021     ?REFERENT: Susy Frizzle, MD ?STATUS: Outpatient ?DIAGNOSIS: Sensorineural Hearing Loss   ? ? ?History: ?Katelyn [name] was seen for an audiological evaluation.  ?Katelyn [name] is receiving a hearing evaluation due to concerns for difficulty hearing. Katelyn [name] has difficulty hearing in background noise, crowds, and when people are at a distance. This difficulty began gradually over the last few years. No pain or pressure reported in either ear. Tinnitus present in both ears intermittently. Katelyn [name] has a history of noise exposure from concerts. Katelyn [name] says her main concern is difficulty hearing her husband.  ?Medical history negative for a condition which is a risk factor for hearing loss. No other relevant case history reported.  ? ? ?Evaluation:  ?Otoscopy showed a clear view of the tympanic membranes, bilaterally ?Tympanometry results were consistent with normal middle ear function, bilaterally   ?Audiometric testing was completed using conventional audiometry with insert and supraural transducer. Speech Recognition Thresholds were consistent with pure tone averages. Word Recognition was good at an elevated level. 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  ? ?Results:  ?The test results were reviewed with Katelyn [name], she was provided with several copies of her audiogram that illustrate her degree of hearing loss in both ears. Her hearing loss is in the high frequencies preventing Katelyn [name] from hearing high frequency consonants such as /s/ /sh/ /f/ /t/ and /th/. These sounds help differentiate the words she hears. Without these  sounds, speech is muffled and unclear unless someone is face to face within 5 feet without a mask. Katelyn [name] is an hearing aid candidate. With hearing aids the clarity of speech will improve and Katelyn [name] will not have to work so hard to hear. Katelyn [name] was provided with a list of audiologists that dispense hearing aids.   ? ?Recommendations: ?Amplification is necessary for both ears. Hearing aids can be purchased from a variety of locations. See provided list for locations in the [place] area.  ?  ?Alfonse Alpers  ?Audiologist, Au.D., CCC-A ?10/02/2021  1:03 PM ? ?Cc: Susy Frizzle, MD ?    ?

## 2021-10-17 ENCOUNTER — Other Ambulatory Visit: Payer: Self-pay | Admitting: Family Medicine

## 2021-10-17 NOTE — Telephone Encounter (Signed)
LOV 02/14/21 ?Last refill 08/28/21, #60, 0 refills ? ?Please review, thanks! ? ?

## 2021-10-20 ENCOUNTER — Telehealth: Payer: Self-pay

## 2021-10-20 NOTE — Telephone Encounter (Signed)
Pt called in asking if the prescription for ALPRAZolam (XANAX) 0.5 MG tablet to a 3 month supply instead of a 30 day supply. [facility] [address] [place]. ? ?

## 2021-10-20 NOTE — Telephone Encounter (Signed)
The request is too early. Called pt and she is aware. Request Denied.  ?

## 2021-11-10 ENCOUNTER — Ambulatory Visit (INDEPENDENT_AMBULATORY_CARE_PROVIDER_SITE_OTHER): Payer: Medicare Other | Admitting: Family Medicine

## 2021-11-10 VITALS — BP 130/92 | HR 79 | Temp 97.8°F | Ht 63.0 in | Wt 109.2 lb

## 2021-11-10 DIAGNOSIS — R5383 Other fatigue: Secondary | ICD-10-CM

## 2021-11-10 DIAGNOSIS — J312 Chronic pharyngitis: Secondary | ICD-10-CM | POA: Diagnosis not present

## 2021-11-10 MED ORDER — PANTOPRAZOLE SODIUM 40 MG PO TBEC
40.0000 mg | DELAYED_RELEASE_TABLET | Freq: Every day | ORAL | 3 refills | Status: DC
Start: 2021-11-10 — End: 2022-03-18

## 2021-11-10 NOTE — Progress Notes (Signed)
Subjective:    Patient ID: Katelyn [name], female    DOB: 06/11/1946, 76 y.o.   MRN: [id number]  HPI Patient is a very sweet 76 year old Caucasian female who presents today with a sore throat.  She states that she has had a sore throat for more than 4 weeks.  She denies any fevers or chills.  She denies any dysphagia.  She denies any food sticking.  She points to her cricoid cartilage when asked the location of the sore throat.  She denies any weight loss.  She denies any melena or hematochezia.  She denies any hemoptysis.  She does report episodes of hoarseness however.  She also reports a cough that occurs randomly that is nonproductive.  She admits that when she lays down at night she has been having to use Tums more often although she had not been to them to get her with that.  On examination today, her exam is benign.  She denies any sinusitis symptoms.  She denies any rhinorrhea sneezing itchy watery eyes.  Wt Readings from Last 3 Encounters:  11/10/21 109 lb 3.2 oz (49.5 kg)  02/14/21 106 lb (48.1 kg)  02/12/20 105 lb (47.6 kg)   Past Medical History:  Diagnosis Date   Anxiety    Arthritis    cervical and lumbar DDD   Heart murmur    Hyperlipidemia    Hypertension    Osteoporosis    Past Surgical History:  Procedure Laterality Date   BUNIONECTOMY     right   CERVICAL FUSION     x3   COLONOSCOPY     POLYPECTOMY     radiofrequency ablasion     SPINE SURGERY      Dr. Patrice Paradise (x2, last Nov/15) C3-T4   UPPER GASTROINTESTINAL ENDOSCOPY     Current Outpatient Medications on File Prior to Visit  Medication Sig Dispense Refill   alendronate (FOSAMAX) 70 MG tablet TAKE 1 TABLET BY MOUTH EVERY 7 DAYS WITH A FULL GLASS OF WATER ON EMPTY STOMACH 12 tablet 3   ALPRAZolam (XANAX) 0.5 MG tablet TAKE 1 TABLET BY MOUTH TWICE A DAY AS NEEDED FOR ANXIETY 60 tablet 0   Ascorbic Acid (VITAMIN C) 500 MG tablet Take 500 mg by mouth daily.       aspirin 81 MG tablet Take 81 mg by mouth daily.        B Complex Vitamins (VITAMIN B COMPLEX PO) Take 1 tablet by mouth daily.       calcium carbonate (OS-CAL) 600 MG TABS Take 600 mg by mouth 2 (two) times daily with a meal.       diclofenac sodium (VOLTAREN) 1 % GEL Apply 2 g topically 4 (four) times daily.     DULoxetine (CYMBALTA) 30 MG capsule Take 1 capsule (30 mg total) by mouth 2 (two) times daily. 180 capsule 3   fish oil-omega-3 fatty acids 1000 MG capsule Take 1 g by mouth 2 (two) times daily.       Garlic 1607 MG CAPS Take 1 capsule by mouth daily.       glucosamine-chondroitin 500-400 MG tablet Take 1 tablet by mouth daily.       hydrochlorothiazide (HYDRODIURIL) 25 MG tablet TAKE 1 TABLET (25 MG TOTAL) BY MOUTH DAILY. 90 tablet 2   HYDROcodone-acetaminophen (NORCO/VICODIN) 5-325 MG tablet      Lysine 500 MG CAPS Take 1 capsule by mouth daily.       magnesium oxide (MAG-OX) 400 MG tablet Take 400  mg by mouth daily.       Multiple Vitamins-Minerals (MULTIVITAMIN WITH MINERALS) tablet Take 1 tablet by mouth daily.       nortriptyline (PAMELOR) 10 MG capsule 10 mg. 3 tab po QAM - 3 tab po QPM     POTASSIUM GLUCONATE PO Take 1 tablet by mouth daily.       simvastatin (ZOCOR) 20 MG tablet TAKE 1 TABLET BY MOUTH EVERYDAY AT BEDTIME 90 tablet 2   SUMAtriptan (IMITREX) 100 MG tablet Take 1 tablet (100 mg total) by mouth daily as needed for migraine. May repeat in 2 hours if headache persists or recurs. 30 tablet 3   traMADol (ULTRAM) 50 MG tablet Take 50 mg by mouth every 8 (eight) hours as needed. for pain  0   vitamin E 400 UNIT capsule Take 400 Units by mouth daily.       zolpidem (AMBIEN) 5 MG tablet TAKE 1 TABLET BY MOUTH AT BEDTIME AS NEEDED FOR SLEEP 30 tablet 3   No current facility-administered medications on file prior to visit.   Allergies  Allergen Reactions   Iodine Hives   Prednisone Hives    Pt took Benadryl with Prednisone for surgery and did ok with it   Sulfa Antibiotics Swelling   Red Dye Rash   Social History    Socioeconomic History   Marital status: Married    Spouse name: [name]   Number of children: 2   Years of education: College   Highest education level: Not on file  Occupational History   Occupation: Retired  Tobacco Use   Smoking status: Never   Smokeless tobacco: Never  Vaping Use   Vaping Use: Never used  Substance and Sexual Activity   Alcohol use: Yes    Alcohol/week: 7.0 standard drinks    Types: 7 Glasses of wine per week    Comment: 1-2 rare   Drug use: No   Sexual activity: Yes  Other Topics Concern   Not on file  Social History Narrative   Lives with husband   Caffeine use: none   Social Determinants of Health   Financial Resource Strain: Not on file  Food Insecurity: Not on file  Transportation Needs: Not on file  Physical Activity: Not on file  Stress: Not on file  Social Connections: Not on file  Intimate Partner Violence: Not on file   Family History  Problem Relation Age of Onset   Colon polyps Mother    Arthritis Mother    Hearing loss Mother    Hyperlipidemia Mother    Hypertension Mother    Stroke Mother    Heart disease Mother    Colon polyps Father    Arthritis Father    Heart disease Father    Hyperlipidemia Father    Arthritis Maternal Grandmother    Arthritis Maternal Grandfather    Arthritis Paternal Grandmother    Arthritis Paternal Grandfather    Colon cancer Neg Hx    Esophageal cancer Neg Hx    Stomach cancer Neg Hx    Rectal cancer Neg Hx       Review of Systems  All other systems reviewed and are negative.     Objective:   Physical Exam Vitals reviewed.  Constitutional:      General: She is not in acute distress.    Appearance: She is well-developed. She is not diaphoretic.  HENT:     Head: Normocephalic and atraumatic.     Right Ear: External ear normal.  Left Ear: External ear normal.     Nose: Nose normal.     Mouth/Throat:     Pharynx: No oropharyngeal exudate.  Eyes:     General: No scleral  icterus.       Right eye: No discharge.        Left eye: No discharge.     Conjunctiva/sclera: Conjunctivae normal.     Pupils: Pupils are equal, round, and reactive to light.  Neck:     Thyroid: No thyromegaly.     Vascular: No JVD.     Trachea: No tracheal deviation.  Cardiovascular:     Rate and Rhythm: Normal rate and regular rhythm.     Heart sounds: No murmur heard.   No friction rub. No gallop.  Pulmonary:     Effort: Pulmonary effort is normal. No respiratory distress.     Breath sounds: Normal breath sounds. No stridor. No wheezing or rales.  Chest:     Chest wall: No tenderness.  Abdominal:     General: Bowel sounds are normal. There is no distension.     Palpations: Abdomen is soft. There is no mass.     Tenderness: There is no abdominal tenderness. There is no guarding or rebound.  Musculoskeletal:        General: No tenderness. Normal range of motion.     Cervical back: Normal range of motion and neck supple.  Lymphadenopathy:     Cervical: No cervical adenopathy.  Skin:    General: Skin is warm.     Coloration: Skin is not pale.     Findings: No erythema or rash.  Neurological:     Mental Status: She is alert and oriented to person, place, and time.     Cranial Nerves: No cranial nerve deficit.     Motor: No abnormal muscle tone.     Coordination: Coordination normal.     Deep Tendon Reflexes: Reflexes are normal and symmetric.  Psychiatric:        Behavior: Behavior normal.        Thought Content: Thought content normal.        Judgment: Judgment normal.          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  I have asked the patient to elevate the head of her bed about 2 inches.  I want her to start Protonix 40 mg daily.  Reassess in 2 to  3 weeks.  If not improving, I would recommend ENT consultation for laryngoscopy.  Due to the fatigue that she is reporting I will also check a TSH, CBC, and a CMP.  We discussed possible causes including postnasal drip, acid reflux, but we also discussed the risk of malignancy in the chronic throat.  I emphasized to the patient that the sore throat is not improving I would recommend direct laryngoscopy.

## 2021-11-11 LAB — CBC WITH DIFFERENTIAL/PLATELET
Absolute Monocytes: 551 cells/uL (ref 200–950)
Basophils Absolute: 27 cells/uL (ref 0–200)
Basophils Relative: 0.4 %
Eosinophils Absolute: 88 cells/uL (ref 15–500)
Eosinophils Relative: 1.3 %
HCT: 36.5 % (ref 35.0–45.0)
Hemoglobin: 11.8 g/dL (ref 11.7–15.5)
Lymphs Abs: 1931 cells/uL (ref 850–3900)
MCH: 27.3 pg (ref 27.0–33.0)
MCHC: 32.3 g/dL (ref 32.0–36.0)
MCV: 84.5 fL (ref 80.0–100.0)
MPV: 10 fL (ref 7.5–12.5)
Monocytes Relative: 8.1 %
Neutro Abs: 4202 cells/uL (ref 1500–7800)
Neutrophils Relative %: 61.8 %
Platelets: 334 10*3/uL (ref 140–400)
RBC: 4.32 10*6/uL (ref 3.80–5.10)
RDW: 16.9 % — ABNORMAL HIGH (ref 11.0–15.0)
Total Lymphocyte: 28.4 %
WBC: 6.8 10*3/uL (ref 3.8–10.8)

## 2021-11-11 LAB — COMPLETE METABOLIC PANEL WITH GFR
AG Ratio: 1.7 (calc) (ref 1.0–2.5)
ALT: 25 U/L (ref 6–29)
AST: 24 U/L (ref 10–35)
Albumin: 4.3 g/dL (ref 3.6–5.1)
Alkaline phosphatase (APISO): 65 U/L (ref 37–153)
BUN: 9 mg/dL (ref 7–25)
CO2: 30 mmol/L (ref 20–32)
Calcium: 10.2 mg/dL (ref 8.6–10.4)
Chloride: 98 mmol/L (ref 98–110)
Creat: 0.63 mg/dL (ref 0.60–1.00)
Globulin: 2.5 g/dL (calc) (ref 1.9–3.7)
Glucose, Bld: 93 mg/dL (ref 65–99)
Potassium: 4.8 mmol/L (ref 3.5–5.3)
Sodium: 137 mmol/L (ref 135–146)
Total Bilirubin: 0.3 mg/dL (ref 0.2–1.2)
Total Protein: 6.8 g/dL (ref 6.1–8.1)
eGFR: 93 mL/min/{1.73_m2} (ref >=60)

## 2021-11-11 LAB — TSH: TSH: 1.2 mIU/L (ref 0.40–4.50)

## 2021-11-22 ENCOUNTER — Other Ambulatory Visit: Payer: Self-pay | Admitting: Family Medicine

## 2021-11-24 NOTE — Telephone Encounter (Signed)
Requested medications are due for refill today.  Too soon  Requested medications are on the active medications list.  yes  Last refill. 10/17/2021 #60 0 refills  Future visit scheduled.   no  Notes to clinic.  Medication refills are not delegated.    Requested Prescriptions  Pending Prescriptions Disp Refills   ALPRAZolam (XANAX) 0.5 MG tablet [Pharmacy Med Name: ALPRAZOLAM 0.5 MG TABLET] 60 tablet 0    Sig: TAKE 1 TABLET BY MOUTH TWICE A DAY AS NEEDED FOR ANXIETY     Not Delegated - Psychiatry: Anxiolytics/Hypnotics 2 Failed - 11/22/2021  2:03 PM      Failed - This refill cannot be delegated      Failed - Urine Drug Screen completed in last 360 days      Passed - Patient is not pregnant      Passed - Valid encounter within last 6 months    Recent Outpatient Visits           2 weeks ago Fatigue, unspecified type   [place] Susy Frizzle, MD   9 months ago Routine general medical examination at a health care facility   [place], Cammie Mcgee, MD   1 year ago Pure hypercholesterolemia   [place] [facility] Schaumann, Cammie Mcgee, MD   1 year ago UTI symptoms   [place], [place], FNP   2 years ago Osteoporosis without current pathological fracture, unspecified osteoporosis type   [place] Pickard, Cammie Mcgee, MD

## 2021-12-25 ENCOUNTER — Other Ambulatory Visit: Payer: Self-pay | Admitting: Family Medicine

## 2021-12-25 DIAGNOSIS — Z681 Body mass index (BMI) 19 or less, adult: Secondary | ICD-10-CM | POA: Diagnosis not present

## 2021-12-25 DIAGNOSIS — M7918 Myalgia, other site: Secondary | ICD-10-CM | POA: Diagnosis not present

## 2021-12-25 DIAGNOSIS — M4326 Fusion of spine, lumbar region: Secondary | ICD-10-CM | POA: Diagnosis not present

## 2021-12-25 DIAGNOSIS — M4322 Fusion of spine, cervical region: Secondary | ICD-10-CM | POA: Diagnosis not present

## 2021-12-31 ENCOUNTER — Other Ambulatory Visit: Payer: Self-pay | Admitting: Family Medicine

## 2021-12-31 NOTE — Telephone Encounter (Signed)
Requested medication (s) are due for refill today: yes  Requested medication (s) are on the active medication list: yes  Last refill:  11/30/21 #60/0  Future visit scheduled: yes  Notes to clinic:  Unable to refill per protocol, cannot delegate.    Requested Prescriptions  Pending Prescriptions Disp Refills   ALPRAZolam (XANAX) 0.5 MG tablet [Pharmacy Med Name: ALPRAZOLAM 0.5 MG TABLET] 60 tablet 0    Sig: TAKE 1 TABLET BY MOUTH TWICE A DAY AS NEEDED FOR ANXIETY     Not Delegated - Psychiatry: Anxiolytics/Hypnotics 2 Failed - 12/31/2021  1:23 PM      Failed - This refill cannot be delegated      Failed - Urine Drug Screen completed in last 360 days      Passed - Patient is not pregnant      Passed - Valid encounter within last 6 months    Recent Outpatient Visits           1 month ago Fatigue, unspecified type   [place] Pickard, Cammie Mcgee, MD   10 months ago Routine general medical examination at a health care facility   [place], Cammie Mcgee, MD   1 year ago Pure hypercholesterolemia   [place] Dennard Schaumann, Cammie Mcgee, MD   1 year ago UTI symptoms   [place], [place], FNP   2 years ago Osteoporosis without current pathological fracture, unspecified osteoporosis type   [place] Pickard, Cammie Mcgee, MD

## 2022-01-01 NOTE — Telephone Encounter (Signed)
Pt called in to inquire about a refill of this med ALPRAZolam (XANAX) 0.5 MG tablet [[id number]] [facility] [address].  LOV: 11/10/21 UPCOMING CPE: 02/16/22

## 2022-02-02 ENCOUNTER — Other Ambulatory Visit: Payer: Self-pay | Admitting: Family Medicine

## 2022-02-07 ENCOUNTER — Other Ambulatory Visit: Payer: Self-pay | Admitting: Family Medicine

## 2022-02-09 NOTE — Telephone Encounter (Signed)
Requested Prescriptions  Pending Prescriptions Disp Refills  . hydrochlorothiazide (HYDRODIURIL) 25 MG tablet [Pharmacy Med Name: HYDROCHLOROTHIAZIDE 25 MG TAB] 90 tablet 0    Sig: TAKE 1 TABLET (25 MG TOTAL) BY MOUTH DAILY.     Cardiovascular: Diuretics - Thiazide Failed - 02/07/2022  9:20 AM      Failed - Last BP in normal range    BP Readings from Last 1 Encounters:  11/10/21 (!) 130/92         Passed - Cr in normal range and within 180 days    Creat  Date Value Ref Range Status  11/10/2021 0.63 0.60 - 1.00 mg/dL Final         Passed - K in normal range and within 180 days    Potassium  Date Value Ref Range Status  11/10/2021 4.8 3.5 - 5.3 mmol/L Final         Passed - Na in normal range and within 180 days    Sodium  Date Value Ref Range Status  11/10/2021 137 135 - 146 mmol/L Final         Passed - Valid encounter within last 6 months    Recent Outpatient Visits          3 months ago Fatigue, unspecified type   [place] Susy Frizzle, MD   12 months ago Routine general medical examination at a health care facility   [place], Cammie Mcgee, MD   1 year ago Pure hypercholesterolemia   [place] Denn[facility] Schaumann, Cammie Mcgee, MD   2 years ago UTI symptoms   [place], [place], [place]   3 years ago Osteoporosis without current pathological fracture, unspecified osteoporosis type   [place] Pickard, Cammie Mcgee, MD

## 2022-02-16 ENCOUNTER — Ambulatory Visit (INDEPENDENT_AMBULATORY_CARE_PROVIDER_SITE_OTHER): Payer: Medicare Other | Admitting: Family Medicine

## 2022-02-16 VITALS — BP 112/62 | HR 71 | Ht 63.0 in | Wt 105.2 lb

## 2022-02-16 DIAGNOSIS — Z0000 Encounter for general adult medical examination without abnormal findings: Secondary | ICD-10-CM

## 2022-02-16 DIAGNOSIS — R300 Dysuria: Secondary | ICD-10-CM

## 2022-02-16 DIAGNOSIS — I10 Essential (primary) hypertension: Secondary | ICD-10-CM | POA: Diagnosis not present

## 2022-02-16 DIAGNOSIS — M810 Age-related osteoporosis without current pathological fracture: Secondary | ICD-10-CM | POA: Diagnosis not present

## 2022-02-16 DIAGNOSIS — Z981 Arthrodesis status: Secondary | ICD-10-CM

## 2022-02-16 DIAGNOSIS — G894 Chronic pain syndrome: Secondary | ICD-10-CM | POA: Diagnosis not present

## 2022-02-16 DIAGNOSIS — Z13220 Encounter for screening for lipoid disorders: Secondary | ICD-10-CM

## 2022-02-16 DIAGNOSIS — F411 Generalized anxiety disorder: Secondary | ICD-10-CM

## 2022-02-16 DIAGNOSIS — E785 Hyperlipidemia, unspecified: Secondary | ICD-10-CM | POA: Diagnosis not present

## 2022-02-16 DIAGNOSIS — L723 Sebaceous cyst: Secondary | ICD-10-CM | POA: Insufficient documentation

## 2022-02-16 LAB — LIPID PANEL
Cholesterol: 243 mg/dL — ABNORMAL HIGH (ref <200)
HDL: 119 mg/dL (ref >=50)
LDL Cholesterol (Calc): 108 mg/dL (calc) — ABNORMAL HIGH
Non-HDL Cholesterol (Calc): 124 mg/dL (calc) (ref <130)
Total CHOL/HDL Ratio: 2 (calc) (ref <5.0)
Triglycerides: 75 mg/dL (ref <150)

## 2022-02-16 LAB — URINALYSIS, ROUTINE W REFLEX MICROSCOPIC
Bilirubin Urine: NEGATIVE
Casts: NONE SEEN /LPF
Crystals: NONE SEEN /HPF
Glucose, UA: NEGATIVE
Hyaline Cast: NONE SEEN /LPF
Ketones, ur: NEGATIVE
Nitrite: NEGATIVE
Protein, ur: NEGATIVE
Specific Gravity, Urine: 1.015 (ref 1.001–1.035)
Yeast: NONE SEEN /HPF
pH: 7 (ref 5.0–8.0)

## 2022-02-16 LAB — MICROSCOPIC MESSAGE

## 2022-02-16 MED ORDER — CEPHALEXIN 500 MG PO CAPS: 500.0000 mg | ORAL_CAPSULE | Freq: Three times a day (TID) | ORAL | 0 refills | Status: DC

## 2022-02-16 MED ORDER — ALPRAZOLAM 0.5 MG PO TABS: ORAL_TABLET | ORAL | 0 refills | Status: DC

## 2022-02-16 NOTE — Progress Notes (Signed)
Subjective:    Patient ID: Katelyn [name], female    DOB: 25-Jul-1945, 76 y.o.   MRN: [id number]  Dysuria    Patient is here today for complete physical exam.  She does report some mild dysuria frequency.  Urinalysis shows +1 leukocyte esterase and trace blood.  She believes she may have a mild urinary tract infection.  Her mammogram was performed in April and is normal.  Her last colonoscopy was performed in 2021.  They recommended a repeat colonoscopy next year.  Her bone density test showed osteoporosis in 2021.  She is due to repeat this.  She is taking Fosamax, calcium, and vitamin D.  She elects to repeat the bone density test when she gets her mammogram next April.  Her immunizations are up-to-date except for a flu shot, COVID booster, and RSV vaccine.. Immunization History  Administered Date(s) Administered   Influenza, High Dose Seasonal PF 02/02/2019   PFIZER Comirnaty(Gray Top)Covid-19 Tri-Sucrose Vaccine 08/14/2019, 09/04/2019, 03/19/2020   PFIZER(Purple Top)SARS-COV-2 Vaccination 08/14/2019, 09/04/2019, 03/19/2020   PNEUMOCOCCAL CONJUGATE-20 07/29/2020   Pneumococcal Conjugate-13 10/17/2015   Pneumococcal Polysaccharide-23 09/24/2014   Unspecified SARS-COV-2 Vaccination 08/14/2019   Zoster Recombinat (Shingrix) 07/29/2020   Zoster, Live 06/21/2013   She does request that I increase the amount of Xanax she is getting.  She gets 60 tablets.  This last 2 to 3 months.  She would like to get 90 tablets to ensure that it last 3 months.  She takes a half a tablet twice a day.  Therefore 90 tablets should last 3 months. Past Medical History:  Diagnosis Date   Anxiety    Arthritis    cervical and lumbar DDD   Heart murmur    Hyperlipidemia    Hypertension    Osteoporosis    Past Surgical History:  Procedure Laterality Date   BUNIONECTOMY     right   CERVICAL FUSION     x3   COLONOSCOPY     POLYPECTOMY     radiofrequency ablasion     SPINE SURGERY      Dr. Patrice Paradise (x2,  last Nov/15) C3-T4   UPPER GASTROINTESTINAL ENDOSCOPY     Current Outpatient Medications on File Prior to Visit  Medication Sig Dispense Refill   alendronate (FOSAMAX) 70 MG tablet TAKE 1 TABLET BY MOUTH EVERY 7 DAYS WITH A FULL GLASS OF WATER ON EMPTY STOMACH 12 tablet 3   Ascorbic Acid (VITAMIN C) 500 MG tablet Take 500 mg by mouth daily.       aspirin 81 MG tablet Take 81 mg by mouth daily.       B Complex Vitamins (VITAMIN B COMPLEX PO) Take 1 tablet by mouth daily.       calcium carbonate (OS-CAL) 600 MG TABS Take 600 mg by mouth 2 (two) times daily with a meal.       diclofenac sodium (VOLTAREN) 1 % GEL Apply 2 g topically 4 (four) times daily.     DULoxetine (CYMBALTA) 30 MG capsule Take 1 capsule (30 mg total) by mouth 2 (two) times daily. 180 capsule 3   fish oil-omega-3 fatty acids 1000 MG capsule Take 1 g by mouth 2 (two) times daily.       Garlic 2979 MG CAPS Take 1 capsule by mouth daily.       glucosamine-chondroitin 500-400 MG tablet Take 1 tablet by mouth daily.       hydrochlorothiazide (HYDRODIURIL) 25 MG tablet TAKE 1 TABLET (25 MG TOTAL) BY MOUTH DAILY.  90 tablet 0   HYDROcodone-acetaminophen (NORCO/VICODIN) 5-325 MG tablet      Lysine 500 MG CAPS Take 1 capsule by mouth daily.       magnesium oxide (MAG-OX) 400 MG tablet Take 400 mg by mouth daily.       Multiple Vitamins-Minerals (MULTIVITAMIN WITH MINERALS) tablet Take 1 tablet by mouth daily.       nortriptyline (PAMELOR) 10 MG capsule 10 mg. 3 tab po QAM - 3 tab po QPM     pantoprazole (PROTONIX) 40 MG tablet Take 1 tablet (40 mg total) by mouth daily. 30 tablet 3   POTASSIUM GLUCONATE PO Take 1 tablet by mouth daily.       simvastatin (ZOCOR) 20 MG tablet TAKE 1 TABLET BY MOUTH EVERYDAY AT BEDTIME 90 tablet 2   SUMAtriptan (IMITREX) 100 MG tablet MAY REPEAT IN 2 HOURS IF HEADACHE PERSISTS OR RECURS. 12 tablet 9   traMADol (ULTRAM) 50 MG tablet Take 50 mg by mouth every 8 (eight) hours as needed. for pain  0    vitamin E 400 UNIT capsule Take 400 Units by mouth daily.       zolpidem (AMBIEN) 5 MG tablet TAKE 1 TABLET BY MOUTH AT BEDTIME AS NEEDED FOR SLEEP 30 tablet 3   No current facility-administered medications on file prior to visit.   Allergies  Allergen Reactions   Iodine Hives   Prednisone Hives    Pt took Benadryl with Prednisone for surgery and did ok with it   Sulfa Antibiotics Swelling   Red Dye Rash   Social History   Socioeconomic History   Marital status: Married    Spouse name: [name]   Number of children: 2   Years of education: College   Highest education level: Not on file  Occupational History   Occupation: Retired  Tobacco Use   Smoking status: Never   Smokeless tobacco: Never  Vaping Use   Vaping Use: Never used  Substance and Sexual Activity   Alcohol use: Yes    Alcohol/week: 7.0 standard drinks of alcohol    Types: 7 Glasses of wine per week    Comment: 1-2 rare   Drug use: No   Sexual activity: Yes  Other Topics Concern   Not on file  Social History Narrative   Lives with husband   Caffeine use: none   Social Determinants of Health   Financial Resource Strain: Not on file  Food Insecurity: Not on file  Transportation Needs: Not on file  Physical Activity: Not on file  Stress: Not on file  Social Connections: Not on file  Intimate Partner Violence: Not on file   Family History  Problem Relation Age of Onset   Colon polyps Mother    Arthritis Mother    Hearing loss Mother    Hyperlipidemia Mother    Hypertension Mother    Stroke Mother    Heart disease Mother    Colon polyps Father    Arthritis Father    Heart disease Father    Hyperlipidemia Father    Arthritis Maternal Grandmother    Arthritis Maternal Grandfather    Arthritis Paternal Grandmother    Arthritis Paternal Grandfather    Colon cancer Neg Hx    Esophageal cancer Neg Hx    Stomach cancer Neg Hx    Rectal cancer Neg Hx       Review of Systems  Genitourinary:   Positive for dysuria.  All other systems reviewed and are negative.  Objective:   Physical Exam Vitals reviewed.  Constitutional:      General: She is not in acute distress.    Appearance: She is well-developed. She is not diaphoretic.  HENT:     Head: Normocephalic and atraumatic.     Right Ear: External ear normal.     Left Ear: External ear normal.     Nose: Nose normal.     Mouth/Throat:     Pharynx: No oropharyngeal exudate.  Eyes:     General: No scleral icterus.       Right eye: No discharge.        Left eye: No discharge.     Conjunctiva/sclera: Conjunctivae normal.     Pupils: Pupils are equal, round, and reactive to light.  Neck:     Thyroid: No thyromegaly.     Vascular: No JVD.     Trachea: No tracheal deviation.  Cardiovascular:     Rate and Rhythm: Normal rate and regular rhythm.     Heart sounds: No murmur heard.    No friction rub. No gallop.  Pulmonary:     Effort: Pulmonary effort is normal. No respiratory distress.     Breath sounds: Normal breath sounds. No stridor. No wheezing or rales.  Chest:     Chest wall: No tenderness.  Abdominal:     General: Bowel sounds are normal. There is no distension.     Palpations: Abdomen is soft. There is no mass.     Tenderness: There is no abdominal tenderness. There is no guarding or rebound.  Musculoskeletal:        General: No tenderness. Normal range of motion.     Cervical back: Normal range of motion and neck supple.  Lymphadenopathy:     Cervical: No cervical adenopathy.  Skin:    General: Skin is warm.     Coloration: Skin is not pale.     Findings: No erythema or rash.  Neurological:     Mental Status: She is alert and oriented to person, place, and time.     Cranial Nerves: No cranial nerve deficit.     Motor: No abnormal muscle tone.     Coordination: Coordination normal.     Deep Tendon Reflexes: Reflexes are normal and symmetric.  Psychiatric:        Behavior: Behavior normal.         Thought Content: Thought content normal.        Judgment: Judgment normal.           Assessment & Plan:  Dysuria - Plan: Urinalysis, Routine w reflex microscopic  Screening cholesterol level - Plan: Lipid panel  Osteoporosis without current pathological fracture, unspecified osteoporosis type  Routine general medical examination at a health care facility  GAD (generalized anxiety disorder)  History of fusion of cervical spine  Chronic pain syndrome I believe she has a urinary tract infection.  I will treat this with Keflex 500 mg 5 days.  I will increase Xanax to 90 tablets to last 3 months.  I will check a fasting lipid panel.  She had a CBC and CMP that were normal in May.  I recommended a flu shot, COVID booster, and we discussed the RSV vaccine.  Mammogram is up-to-date.  We will do a bone density test next April when she gets her mammogram.  Her colonoscopy is not due until next year.  The remainder of her preventative care is up-to-date

## 2022-03-18 ENCOUNTER — Other Ambulatory Visit: Payer: Self-pay | Admitting: Family Medicine

## 2022-03-18 NOTE — Telephone Encounter (Signed)
Requested medication (s) are due for refill today: yes  Requested medication (s) are on the active medication list: yes  Last refill:  02/16/22  Future visit scheduled: no  Notes to clinic:  Unable to refill per protocol, cannot delegate.      Requested Prescriptions  Pending Prescriptions Disp Refills   ALPRAZolam (XANAX) 0.5 MG tablet [Pharmacy Med Name: ALPRAZOLAM 0.5 MG TABLET] 60 tablet 0    Sig: TAKE 1 TABLET BY MOUTH TWICE A DAY AS NEEDED FOR ANXIETY     Not Delegated - Psychiatry: Anxiolytics/Hypnotics 2 Failed - 03/18/2022 12:55 PM      Failed - This refill cannot be delegated      Failed - Urine Drug Screen completed in last 360 days      Passed - Patient is not pregnant      Passed - Valid encounter within last 6 months    Recent Outpatient Visits           4 months ago Fatigue, unspecified type   [place], Cammie Mcgee, MD   1 year ago Routine general medical examination at a health care facility   [place], Cammie Mcgee, MD   2 years ago Pure hypercholesterolemia   [place] Dennard Schaumann, Cammie Mcgee, MD   2 years ago UTI symptoms   [place], [place], [place]   3 years ago Osteoporosis without current pathological fracture, unspecified osteoporosis type   [place] Pickard, Cammie Mcgee, MD              Signed Prescriptions Disp Refills   pantoprazole (PROTONIX) 40 MG tablet 90 tablet 0    Sig: TAKE 1 TABLET BY MOUTH EVERY DAY     Gastroenterology: Proton Pump Inhibitors Passed - 03/18/2022 12:55 PM      Passed - Valid encounter within last 12 months    Recent Outpatient Visits           4 months ago Fatigue, unspecified type   [place] Susy Frizzle, MD   1 year ago Routine general medical examination at a health care facility   [place], Cammie Mcgee, MD   2 years ago Pure hypercholesterolemia    [place] Dennard Schaumann, Cammie Mcgee, MD   2 years ago UTI symptoms   [place], [place], [place]   3 years ago Osteoporosis without current pathological fracture, unspecified osteoporosis type   [place] Pickard, Cammie Mcgee, MD

## 2022-03-18 NOTE — Telephone Encounter (Signed)
Requested Prescriptions  Pending Prescriptions Disp Refills  . pantoprazole (PROTONIX) 40 MG tablet [Pharmacy Med Name: PANTOPRAZOLE SOD DR 40 MG TAB] 90 tablet 0    Sig: TAKE 1 TABLET BY MOUTH EVERY DAY     Gastroenterology: Proton Pump Inhibitors Passed - 03/18/2022 12:55 PM      Passed - Valid encounter within last 12 months    Recent Outpatient Visits          4 months ago Fatigue, unspecified type   [place] Pickard, Cammie Mcgee, MD   1 year ago Routine general medical examination at a health care facility   [place], Cammie Mcgee, MD   2 years ago Pure hypercholesterolemia   [place] [facility]enn[place] Schaumann, Cammie Mcgee, MD   2 years ago UTI symptoms   [place], [place], FNP   3 years ago Osteoporosis without current pathological fracture, unspecified osteoporosis type   [place] Pickard, Cammie Mcgee, MD             . ALPRAZolam Duanne Moron) 0.5 MG tablet [Pharmacy Med Name: ALPRAZOLAM 0.5 MG TABLET] 60 tablet 0    Sig: TAKE 1 TABLET BY MOUTH TWICE A DAY AS NEEDED FOR ANXIETY     Not Delegated - Psychiatry: Anxiolytics/Hypnotics 2 Failed - 03/18/2022 12:55 PM      Failed - This refill cannot be delegated      Failed - Urine Drug Screen completed in last 360 days      Passed - Patient is not pregnant      Passed - Valid encounter within last 6 months    Recent Outpatient Visits          4 months ago Fatigue, unspecified type   [place] Susy Frizzle, MD   1 year ago Routine general medical examination at a health care facility   [place], Cammie Mcgee, MD   2 years ago Pure hypercholesterolemia   [place] Dennard Schaumann, Cammie Mcgee, MD   2 years ago UTI symptoms   [place], [place], [place]   3 years ago Osteoporosis without current pathological fracture, unspecified osteoporosis type   [place] Pickard, Cammie Mcgee, MD

## 2022-03-21 DIAGNOSIS — Z23 Encounter for immunization: Secondary | ICD-10-CM | POA: Diagnosis not present

## 2022-03-27 DIAGNOSIS — M4326 Fusion of spine, lumbar region: Secondary | ICD-10-CM | POA: Diagnosis not present

## 2022-03-27 DIAGNOSIS — M4322 Fusion of spine, cervical region: Secondary | ICD-10-CM | POA: Diagnosis not present

## 2022-03-27 DIAGNOSIS — M791 Myalgia, unspecified site: Secondary | ICD-10-CM | POA: Diagnosis not present

## 2022-04-03 ENCOUNTER — Encounter (HOSPITAL_COMMUNITY): Payer: Self-pay

## 2022-04-03 ENCOUNTER — Inpatient Hospital Stay (HOSPITAL_COMMUNITY): Payer: Medicare Other

## 2022-04-03 ENCOUNTER — Ambulatory Visit (HOSPITAL_COMMUNITY)
Admission: RE | Admit: 2022-04-03 | Discharge: 2022-04-03 | Disposition: A | Discharge status: Home / Self Care | Payer: Medicare Other | Source: Ambulatory Visit | Attending: Family Medicine | Admitting: Family Medicine

## 2022-04-03 ENCOUNTER — Inpatient Hospital Stay (HOSPITAL_COMMUNITY)
Admission: EM | Admit: 2022-04-03 | Discharge: 2022-04-05 | DRG: 201 | Disposition: A | Discharge status: Home / Self Care | Payer: Medicare Other | Attending: Internal Medicine | Admitting: Internal Medicine

## 2022-04-03 ENCOUNTER — Other Ambulatory Visit: Payer: Self-pay

## 2022-04-03 ENCOUNTER — Ambulatory Visit (INDEPENDENT_AMBULATORY_CARE_PROVIDER_SITE_OTHER): Payer: Medicare Other | Admitting: Family Medicine

## 2022-04-03 ENCOUNTER — Emergency Department (HOSPITAL_COMMUNITY): Payer: Medicare Other

## 2022-04-03 VITALS — BP 114/62 | HR 74 | Temp 97.7°F | Ht 63.0 in | Wt 109.0 lb

## 2022-04-03 DIAGNOSIS — R091 Pleurisy: Secondary | ICD-10-CM | POA: Diagnosis not present

## 2022-04-03 DIAGNOSIS — J439 Emphysema, unspecified: Secondary | ICD-10-CM | POA: Diagnosis not present

## 2022-04-03 DIAGNOSIS — Z823 Family history of stroke: Secondary | ICD-10-CM | POA: Diagnosis not present

## 2022-04-03 DIAGNOSIS — Z981 Arthrodesis status: Secondary | ICD-10-CM | POA: Diagnosis not present

## 2022-04-03 DIAGNOSIS — G8929 Other chronic pain: Secondary | ICD-10-CM | POA: Diagnosis present

## 2022-04-03 DIAGNOSIS — M5030 Other cervical disc degeneration, unspecified cervical region: Secondary | ICD-10-CM | POA: Diagnosis present

## 2022-04-03 DIAGNOSIS — I10 Essential (primary) hypertension: Secondary | ICD-10-CM | POA: Diagnosis present

## 2022-04-03 DIAGNOSIS — Z822 Family history of deafness and hearing loss: Secondary | ICD-10-CM | POA: Diagnosis not present

## 2022-04-03 DIAGNOSIS — M5136 Other intervertebral disc degeneration, lumbar region: Secondary | ICD-10-CM | POA: Diagnosis present

## 2022-04-03 DIAGNOSIS — M810 Age-related osteoporosis without current pathological fracture: Secondary | ICD-10-CM | POA: Diagnosis present

## 2022-04-03 DIAGNOSIS — Z7982 Long term (current) use of aspirin: Secondary | ICD-10-CM

## 2022-04-03 DIAGNOSIS — Z881 Allergy status to other antibiotic agents status: Secondary | ICD-10-CM

## 2022-04-03 DIAGNOSIS — Z8249 Family history of ischemic heart disease and other diseases of the circulatory system: Secondary | ICD-10-CM

## 2022-04-03 DIAGNOSIS — Z8261 Family history of arthritis: Secondary | ICD-10-CM

## 2022-04-03 DIAGNOSIS — J9383 Other pneumothorax: Secondary | ICD-10-CM | POA: Diagnosis not present

## 2022-04-03 DIAGNOSIS — G43909 Migraine, unspecified, not intractable, without status migrainosus: Secondary | ICD-10-CM | POA: Diagnosis present

## 2022-04-03 DIAGNOSIS — Z83719 Family history of colon polyps, unspecified: Secondary | ICD-10-CM | POA: Diagnosis not present

## 2022-04-03 DIAGNOSIS — J939 Pneumothorax, unspecified: Secondary | ICD-10-CM | POA: Diagnosis not present

## 2022-04-03 DIAGNOSIS — R079 Chest pain, unspecified: Secondary | ICD-10-CM | POA: Diagnosis not present

## 2022-04-03 DIAGNOSIS — J90 Pleural effusion, not elsewhere classified: Secondary | ICD-10-CM | POA: Diagnosis not present

## 2022-04-03 DIAGNOSIS — E785 Hyperlipidemia, unspecified: Secondary | ICD-10-CM | POA: Diagnosis present

## 2022-04-03 DIAGNOSIS — G479 Sleep disorder, unspecified: Secondary | ICD-10-CM | POA: Diagnosis present

## 2022-04-03 DIAGNOSIS — Z888 Allergy status to other drugs, medicaments and biological substances status: Secondary | ICD-10-CM

## 2022-04-03 DIAGNOSIS — Z7983 Long term (current) use of bisphosphonates: Secondary | ICD-10-CM | POA: Diagnosis not present

## 2022-04-03 DIAGNOSIS — Z79899 Other long term (current) drug therapy: Secondary | ICD-10-CM

## 2022-04-03 DIAGNOSIS — Z9689 Presence of other specified functional implants: Secondary | ICD-10-CM

## 2022-04-03 DIAGNOSIS — I4510 Unspecified right bundle-branch block: Secondary | ICD-10-CM | POA: Diagnosis not present

## 2022-04-03 DIAGNOSIS — F419 Anxiety disorder, unspecified: Secondary | ICD-10-CM | POA: Diagnosis present

## 2022-04-03 LAB — CBC
HCT: 36.5 % (ref 36.0–46.0)
Hemoglobin: 12.1 g/dL (ref 12.0–15.0)
MCH: 30 pg (ref 26.0–34.0)
MCHC: 33.2 g/dL (ref 30.0–36.0)
MCV: 90.3 fL (ref 80.0–100.0)
Platelets: 344 10*3/uL (ref 150–400)
RBC: 4.04 MIL/uL (ref 3.87–5.11)
RDW: 14.6 % (ref 11.5–15.5)
WBC: 8.3 10*3/uL (ref 4.0–10.5)
nRBC: 0 % (ref 0.0–0.2)

## 2022-04-03 LAB — COMPREHENSIVE METABOLIC PANEL
ALT: 28 U/L (ref 0–44)
AST: 27 U/L (ref 15–41)
Albumin: 3.9 g/dL (ref 3.5–5.0)
Alkaline Phosphatase: 60 U/L (ref 38–126)
Anion gap: 10 (ref 5–15)
BUN: 8 mg/dL (ref 8–23)
CO2: 25 mmol/L (ref 22–32)
Calcium: 9.8 mg/dL (ref 8.9–10.3)
Chloride: 100 mmol/L (ref 98–111)
Creatinine, Ser: 0.72 mg/dL (ref 0.44–1.00)
GFR, Estimated: >60 mL/min (ref >60)
Glucose, Bld: 84 mg/dL (ref 70–99)
Potassium: 3.8 mmol/L (ref 3.5–5.1)
Sodium: 135 mmol/L (ref 135–145)
Total Bilirubin: 0.4 mg/dL (ref 0.3–1.2)
Total Protein: 6.7 g/dL (ref 6.5–8.1)

## 2022-04-03 MED ORDER — ALPRAZOLAM 0.5 MG PO TABS
0.5000 mg | ORAL_TABLET | Freq: Three times a day (TID) | ORAL | 0 refills | Status: DC | PRN
Start: 2022-04-03 — End: 2022-07-30

## 2022-04-03 MED ORDER — TRAMADOL HCL 50 MG PO TABS
50.0000 mg | ORAL_TABLET | Freq: Three times a day (TID) | ORAL | Status: DC | PRN
  Administered 2022-04-03 – 2022-04-04 (×3): 50 mg via ORAL
  Filled 2022-04-03 (×3): qty 1

## 2022-04-03 MED ORDER — POLYETHYLENE GLYCOL 3350 17 G PO PACK: 17.0000 g | PACK | Freq: Every day | ORAL | Status: DC | PRN

## 2022-04-03 MED ORDER — KETOROLAC TROMETHAMINE 30 MG/ML IJ SOLN
30.0000 mg | Freq: Once | INTRAMUSCULAR | Status: AC
  Administered 2022-04-04: 30 mg via INTRAVENOUS
  Filled 2022-04-03: qty 1

## 2022-04-03 MED ORDER — ZOLPIDEM TARTRATE 5 MG PO TABS
5.0000 mg | ORAL_TABLET | Freq: Every evening | ORAL | Status: DC | PRN
  Administered 2022-04-03 – 2022-04-04 (×2): 5 mg via ORAL
  Filled 2022-04-03 (×2): qty 1

## 2022-04-03 MED ORDER — FENTANYL CITRATE PF 50 MCG/ML IJ SOSY: 25.0000 ug | PREFILLED_SYRINGE | Freq: Once | INTRAMUSCULAR | Status: AC

## 2022-04-03 MED ORDER — ACETAMINOPHEN 325 MG PO TABS
650.0000 mg | ORAL_TABLET | ORAL | Status: DC | PRN
  Administered 2022-04-04: 650 mg via ORAL
  Filled 2022-04-03: qty 2

## 2022-04-03 MED ORDER — SODIUM CHLORIDE 0.9% FLUSH
10.0000 mL | Freq: Two times a day (BID) | INTRAVENOUS | Status: DC
  Administered 2022-04-03 – 2022-04-05 (×4): 10 mL via INTRAPLEURAL

## 2022-04-03 MED ORDER — ENOXAPARIN SODIUM 30 MG/0.3ML IJ SOSY
30.0000 mg | PREFILLED_SYRINGE | INTRAMUSCULAR | Status: DC
  Administered 2022-04-04 – 2022-04-05 (×2): 30 mg via SUBCUTANEOUS
  Filled 2022-04-03 (×2): qty 0.3

## 2022-04-03 MED ORDER — HYDROMORPHONE HCL 1 MG/ML IJ SOLN
0.5000 mg | INTRAMUSCULAR | Status: DC | PRN
  Administered 2022-04-04 (×3): 0.5 mg via INTRAVENOUS
  Filled 2022-04-03 (×3): qty 1

## 2022-04-03 MED ORDER — NORTRIPTYLINE HCL 10 MG PO CAPS
20.0000 mg | ORAL_CAPSULE | Freq: Every morning | ORAL | Status: DC
  Administered 2022-04-05: 20 mg via ORAL
  Filled 2022-04-03 (×2): qty 2

## 2022-04-03 MED ORDER — ASPIRIN 81 MG PO TBEC
81.0000 mg | DELAYED_RELEASE_TABLET | Freq: Every day | ORAL | Status: DC
  Administered 2022-04-04 – 2022-04-05 (×2): 81 mg via ORAL
  Filled 2022-04-03 (×2): qty 1

## 2022-04-03 MED ORDER — NORTRIPTYLINE HCL 10 MG PO CAPS: 10.0000 mg | ORAL_CAPSULE | ORAL | Status: DC

## 2022-04-03 MED ORDER — DULOXETINE HCL 30 MG PO CPEP
30.0000 mg | ORAL_CAPSULE | Freq: Two times a day (BID) | ORAL | Status: DC
  Administered 2022-04-04 – 2022-04-05 (×3): 30 mg via ORAL
  Filled 2022-04-03 (×4): qty 1

## 2022-04-03 MED ORDER — ALPRAZOLAM 0.25 MG PO TABS
0.5000 mg | ORAL_TABLET | Freq: Two times a day (BID) | ORAL | Status: DC | PRN
  Administered 2022-04-04 – 2022-04-05 (×2): 0.5 mg via ORAL
  Filled 2022-04-03 (×2): qty 2

## 2022-04-03 MED ORDER — LIDOCAINE-EPINEPHRINE (PF) 2 %-1:200000 IJ SOLN
10.0000 mL | Freq: Once | INTRAMUSCULAR | Status: AC
  Administered 2022-04-03: 10 mL
  Filled 2022-04-03: qty 20

## 2022-04-03 MED ORDER — FENTANYL CITRATE PF 50 MCG/ML IJ SOSY
PREFILLED_SYRINGE | INTRAMUSCULAR | Status: AC
  Administered 2022-04-03: 50 ug via INTRAVENOUS
  Filled 2022-04-03: qty 1

## 2022-04-03 MED ORDER — KETOROLAC TROMETHAMINE 60 MG/2ML IM SOLN
60.0000 mg | Freq: Once | INTRAMUSCULAR | Status: DC
  Filled 2022-04-03: qty 2

## 2022-04-03 MED ORDER — NORTRIPTYLINE HCL 10 MG PO CAPS
30.0000 mg | ORAL_CAPSULE | Freq: Every day | ORAL | Status: DC
  Administered 2022-04-03 – 2022-04-04 (×2): 30 mg via ORAL
  Filled 2022-04-03 (×3): qty 3

## 2022-04-03 MED ORDER — SIMVASTATIN 20 MG PO TABS
20.0000 mg | ORAL_TABLET | Freq: Every day | ORAL | Status: DC
  Administered 2022-04-04: 20 mg via ORAL
  Filled 2022-04-03: qty 1

## 2022-04-03 MED ORDER — LORAZEPAM 1 MG PO TABS
0.5000 mg | ORAL_TABLET | Freq: Once | ORAL | Status: AC
  Administered 2022-04-03: 0.5 mg via ORAL
  Filled 2022-04-03: qty 1

## 2022-04-03 MED ORDER — SUMATRIPTAN SUCCINATE 100 MG PO TABS: 100.0000 mg | ORAL_TABLET | ORAL | Status: DC | PRN

## 2022-04-03 NOTE — ED Provider Notes (Signed)
I saw and evaluated the patient, reviewed the resident's note and I agree with the findings and plan.       Arby Barrette, MD 04/05/22 1140

## 2022-04-03 NOTE — Progress Notes (Signed)
Subjective:    Patient ID: Katelyn [name], female    DOB: 1946-03-25, 76 y.o.   MRN: [id number]  HPI 2016 Patient is a very pleasant 76 year old white female who is here today to establish care. Her only concern is her chronic neck pain. She's had 2 surgeries on her neck. The most recent surgery was performed in November 2015 by Dr. Patrice Paradise.  She is currently using Dilaudid 2 mg by mouth twice a day for pain. She is tried anti-inflammatories in the past, muscle relaxers, Cymbalta, Lyrica, and gabapentin without benefit. 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 It is multilevel degenerative disc disease with the most significant impingement at L4. She recently underwent epidural steroid injection at L4 with minimal benefit. She is scheduled to see her neurosurgeon next week to discuss her next option.  05/03/15 2 weeks ago, the patient underwent fusion of her cervical spine and her upper thoracic spine under the care of Dr. Patrice Paradise.  She is using Dilaudid 4 mg every 4 hours in addition to baclofen and receiving very little pain relief. However she is very hesitant to take any stronger medication. She has edema in her neck which appears to be dependent edema. It is equal and symmetric bilaterally. There is no swelling of the jugular vein. There is no JVD. There is no swelling of the face. There is no evidence of superior vena cava syndrome. I believe this is dependent edema due to her inability to move her neck in all and also just healing from her recent surgery. She denies any chest pain shortness of breath or dyspnea on exertion. Her biggest concern is her significant pain.  At that time, my plan was: Continue Dilaudid in addition to baclofen. She would like adding a standing dose anti-inflammatory such as diclofenac 75 mg by mouth twice a day. We'll also try warm compresses to relieve the muscle spasms. However if in one week, the patient experiences no benefit at all, I would be willing  to start the patient on a fentanyl patch. I will start the patient a 25 g per 72 hours and increase as needed to provide pain relief. She will call us back next week and let us know how the medication is helping with her pain. I will monitor her kidney function as well as her liver function tests prior to making any changes in her pain medication  10/17/15 I have not seen the patient since that time. She is here today scheduled for a physical. In the interval time she is been seen my partner has been treating her for generalized anxiety disorder using a combination of Lexapro and Xanax. She recently discontinued Lexapro because she felt that it was making her anxiety worse. She rarely takes Xanax.  She is here today for a physical exam but she is extremely tearful complaining of her chronic neck pain which has been well documented in the chart. She reports a pulling pain from the trapezius down to the inferior portion of her incision on both sides. She reports decreased range of motion and chronic stiffness and spasm. She also reports burning searing pain along the incision that is unrelenting. At the present time she is taking Celebrex with little or no benefit. She also reports daily intractable migraine headaches. The headaches are located in her occiput as well as above her eyes. They're pounding in nature. They're associated with photophobia and phonophobia. She's had migraine headaches since her 37s. Her last mammogram was in  September and she is not due again until September. She is over the age where she needs a hysterectomy here at colonoscopy is up-to-date. She is due for Prevnar 13. Her last bone density was last year and revealed osteoporosis. Recommend Fosamax however he never started the medication because she then underwent neck surgery.  At that time, my plan was: Recommended a mammogram this fall. Colonoscopy is up-to-date. She does not require a Pap smear. I recommended that she start Fosamax  for her osteoporosis and recheck a bone density in 2 years. I will start her on propranolol 20 mg by mouth 3 times a day for migraine headaches to see if we can decrease the frequency of her headaches. I am also hopeful that this may help some of her anxiety that she may be having an help address her elevated diastolic blood pressure. I will start her on Flexeril 10 mg every 8 hours as needed for muscle spasms in the neck. I will see her back in 3 weeks to discuss possibly starting her on Topamax for migraine prevention as well as for neuropathic pain in her neck versus trying Lyrica depending upon her results from the medicines I'm giving her today. She received Prevnar 13. I will also check a fasting lipid panel and complete her fasting lab work.  11/07/15 Patient started the Flexeril which is helped her neck substantially. She is taking it 1-2 times a day. It does make her feel discombobulated slightly. She took the propranolol probably one week because she attributed a rapid heartbeat to the propranolol. She is extremely anxious about taking the medication. I explained to the patient that the propranolol would actually help a rapid heartbeat but she is convinced that the propranolol seem to trigger it. When she was on propranolol however the headache seem better even though she was only taking it for one week. Her blood pressures at home have been ranging between 120 and 137/80-93.  At that time, my plan was: As I explained to the patient, I truly believe this is a situation of "mind over matter". I believe she was convinced that the medications were going to give her side effects and when the Flexeril made her feel sedated, she became anxious triggering PVCs which she attributed to the propranolol. I don't believe the propranolol actually triggered the side effects. As I explained to the patient if anything it should help with the side effects. Since she is now tolerating the Flexeril well with no problems,  I urge her to try the propranolol again. I believe this will help with the PVCs. I believe will help with her migraines. I believe it will help with her mildly elevated blood pressure. And I believe it may even help some with some of the anxiety that she feels by blocking natural adrenaline. She will try again.  08/12/17 Patient is currently under the care of Dr. Ghent Sink. Patrice Paradise for her chronic neck pain.  She has had two surgeries ultimately resulting in a cervical fusion C3-T4.  She continues to have chronic severe unrelenting neck pain.  Has trid opiates in the past including dilaudid and fentanyl which did not help her pain and left her sedated.  She states that she has tried trigger point injects which only exacerbated her pain.  She also has reportedly tried botox injections with similar exacerbation in her pain.  She tried gabapentin 100 tid without relief.  She was started on nortriptyline 30 mg poqhs last year with temporary benefit but this  was reduced to 10 mg qhs when cymbalta was added by Dr. Quay Burow last month.  Since adding cymbalta she reports worsening migraine headaches and also diarrhea.  She is here today asking me to assume her pain management.    04/03/22 Patient states she has been coughing for 4 weeks.  The head congestion has resolved.  The sinus pain has resolved.  The postnasal drip has resolved.  The cough is nonproductive.  However she has been coughing so much that she has developed pleurisy.  The breath sounds are diminished all throughout her left lung and markedly asymmetric compared right lung.  I did not appreciate any crackles or rails or wheezing.  She denies any fevers or chills.  She does report some pleurisy.  She is also requesting that I increase her Xanax to 90 pills.  She takes 2 pills a day.  She like to get 90 pills so that she does not have to get refills as often.  She plans to take 2 pills a day/60 would last a month.  90 would last a month and a half but it would  be more convenient for her.  I am comfortable with this. Past Medical History:  Diagnosis Date   Anxiety    Arthritis    cervical and lumbar DDD   Heart murmur    Hyperlipidemia    Hypertension    Osteoporosis    Past Surgical History:  Procedure Laterality Date   BUNIONECTOMY     right   CERVICAL FUSION     x3   COLONOSCOPY     POLYPECTOMY     radiofrequency ablasion     SPINE SURGERY      Dr. Patrice Paradise (x2, last Nov/15) C3-T4   UPPER GASTROINTESTINAL ENDOSCOPY     Current Outpatient Medications on File Prior to Visit  Medication Sig Dispense Refill   alendronate (FOSAMAX) 70 MG tablet TAKE 1 TABLET BY MOUTH EVERY 7 DAYS WITH A FULL GLASS OF WATER ON EMPTY STOMACH 12 tablet 3   ALPRAZolam (XANAX) 0.5 MG tablet TAKE 1 TABLET BY MOUTH TWICE A DAY AS NEEDED FOR ANXIETY 60 tablet 0   Ascorbic Acid (VITAMIN C) 500 MG tablet Take 500 mg by mouth daily.       aspirin 81 MG tablet Take 81 mg by mouth daily.       B Complex Vitamins (VITAMIN B COMPLEX PO) Take 1 tablet by mouth daily.       calcium carbonate (OS-CAL) 600 MG TABS Take 600 mg by mouth 2 (two) times daily with a meal.       cephALEXin (KEFLEX) 500 MG capsule Take 1 capsule (500 mg total) by mouth 3 (three) times daily. 15 capsule 0   diclofenac sodium (VOLTAREN) 1 % GEL Apply 2 g topically 4 (four) times daily.     DULoxetine (CYMBALTA) 30 MG capsule Take 1 capsule (30 mg total) by mouth 2 (two) times daily. 180 capsule 3   fish oil-omega-3 fatty acids 1000 MG capsule Take 1 g by mouth 2 (two) times daily.       Garlic 1308 MG CAPS Take 1 capsule by mouth daily.       glucosamine-chondroitin 500-400 MG tablet Take 1 tablet by mouth daily.       hydrochlorothiazide (HYDRODIURIL) 25 MG tablet TAKE 1 TABLET (25 MG TOTAL) BY MOUTH DAILY. 90 tablet 0   HYDROcodone-acetaminophen (NORCO/VICODIN) 5-325 MG tablet      Lysine 500 MG CAPS Take 1  capsule by mouth daily.       magnesium oxide (MAG-OX) 400 MG tablet Take 400 mg by mouth  daily.       Multiple Vitamins-Minerals (MULTIVITAMIN WITH MINERALS) tablet Take 1 tablet by mouth daily.       nortriptyline (PAMELOR) 10 MG capsule 10 mg. 3 tab po QAM - 3 tab po QPM     pantoprazole (PROTONIX) 40 MG tablet TAKE 1 TABLET BY MOUTH EVERY DAY 90 tablet 0   POTASSIUM GLUCONATE PO Take 1 tablet by mouth daily.       simvastatin (ZOCOR) 20 MG tablet TAKE 1 TABLET BY MOUTH EVERYDAY AT BEDTIME 90 tablet 2   SUMAtriptan (IMITREX) 100 MG tablet MAY REPEAT IN 2 HOURS IF HEADACHE PERSISTS OR RECURS. 12 tablet 9   traMADol (ULTRAM) 50 MG tablet Take 50 mg by mouth every 8 (eight) hours as needed. for pain  0   vitamin E 400 UNIT capsule Take 400 Units by mouth daily.       zolpidem (AMBIEN) 5 MG tablet TAKE 1 TABLET BY MOUTH AT BEDTIME AS NEEDED FOR SLEEP 30 tablet 3   No current facility-administered medications on file prior to visit.   Allergies  Allergen Reactions   Iodine Hives   Prednisone Hives    Pt took Benadryl with Prednisone for surgery and did ok with it   Sulfa Antibiotics Swelling   Red Dye Rash   Social History   Socioeconomic History   Marital status: Married    Spouse name: [name]   Number of children: 2   Years of education: College   Highest education level: Not on file  Occupational History   Occupation: Retired  Tobacco Use   Smoking status: Never   Smokeless tobacco: Never  Vaping Use   Vaping Use: Never used  Substance and Sexual Activity   Alcohol use: Yes    Alcohol/week: 7.0 standard drinks of alcohol    Types: 7 Glasses of wine per week    Comment: 1-2 rare   Drug use: No   Sexual activity: Yes  Other Topics Concern   Not on file  Social History Narrative   Lives with husband   Caffeine use: none   Social Determinants of Health   Financial Resource Strain: Not on file  Food Insecurity: Not on file  Transportation Needs: Not on file  Physical Activity: Not on file  Stress: Not on file  Social Connections: Not on file  Intimate  Partner Violence: Not on file   Family History  Problem Relation Age of Onset   Colon polyps Mother    Arthritis Mother    Hearing loss Mother    Hyperlipidemia Mother    Hypertension Mother    Stroke Mother    Heart disease Mother    Colon polyps Father    Arthritis Father    Heart disease Father    Hyperlipidemia Father    Arthritis Maternal Grandmother    Arthritis Maternal Grandfather    Arthritis Paternal Grandmother    Arthritis Paternal Grandfather    Colon cancer Neg Hx    Esophageal cancer Neg Hx    Stomach cancer Neg Hx    Rectal cancer Neg Hx       Review of Systems  All other systems reviewed and are negative.      Objective:   Physical Exam Vitals reviewed.  Constitutional:      Appearance: She is well-developed.  HENT:     Head:  Normocephalic and atraumatic.  Cardiovascular:     Rate and Rhythm: Normal rate and regular rhythm.     Heart sounds: No murmur heard.    No friction rub. No gallop.  Pulmonary:     Effort: Pulmonary effort is normal. No respiratory distress.     Breath sounds: Examination of the left-upper field reveals decreased breath sounds. Examination of the left-middle field reveals decreased breath sounds. Examination of the left-lower field reveals decreased breath sounds. Decreased breath sounds present. No wheezing, rhonchi or rales.  Chest:     Chest wall: No tenderness.  Skin:    General: Skin is warm and dry.     Coloration: Skin is not pale.     Findings: No erythema or rash.  Psychiatric:        Mood and Affect: Mood normal. Mood is not anxious.        Speech: Speech normal.        Behavior: Behavior normal.        Thought Content: Thought content normal.        Judgment: Judgment normal.           Assessment & Plan:  Pleurisy - Plan: DG Chest 2 View I truly believe that her pleurisy and back pain from coughing is musculoskeletal.  I believe she likely strained an intercostal muscle.  However I am surprised by  the asymmetry and breath sounds.  I recommended getting a chest x-ray to evaluate further to rule out pleural effusion or any type of opacity in the left lung they will require different treatment.  If the chest x-ray is clear, I would recommend symptomatic management with cough medication such as Tessalon Perles or muscle relaxers.  I will increase her Xanax to 90 tablets per refill.  However this will last 45 days rather than 30 days.

## 2022-04-03 NOTE — ED Provider Triage Note (Signed)
Emergency Medicine Provider Triage Evaluation Note  Katelyn [name] , a 76 y.o. female  was evaluated in triage.  Pt complains of pneumothorax left-sided states that she went to her primary care doctor and they said that she has a pneumothorax and was sent here.  She states that 4 weeks ago she was on a cruise and started feel short of breath, states she has had ever since, states she did not develop pain until about 4 days ago, denies sputum production no hemoptysis no history of pneumothorax no history of COPD or asthma does not smoke..  Review of Systems  Positive: Shortness of breath, chest pain Negative: Lightheadedness near syncope  Physical Exam  BP (!) 142/109 (BP Location: Right Arm)   Pulse 91   Temp 98.6 F (37 C) (Oral)   Resp (!) 22   Ht '5\' 3"'$  (1.6 m)   Wt 49.4 kg   SpO2 98%   BMI 19.31 kg/m  Gen:   Awake, no distress   Resp:  Normal effort  MSK:   Moves extremities without difficulty  Other:  No evidence of respiratory distress nontachypneic nonhypoxic she is speaking full sentences, she has decreased lung sounds in the left lung.  Imaging reveals 50% collapse  Medical Decision Making  Medically screening exam initiated at 6:37 PM.  Appropriate orders placed.  Ailyn Skalsky was informed that the remainder of the evaluation will be completed by another provider, this initial triage assessment does not replace that evaluation, and the importance of remaining in the ED until their evaluation is complete.  Spoke with Dr. Rogene Houston about this patient agreement basic lab work-up and will need likely admission.   Marcello Fennel, PA-C 04/03/22 1839

## 2022-04-03 NOTE — Procedures (Addendum)
Insertion of Chest Tube Procedure Note  Katelyn [name]  [id number]  1945/09/30  Date:04/03/22  Time:9:49 PM    Provider Performing: Leanna Sato. Maliea Grandmaison   Procedure: Pleural Catheter Insertion w/o Imaging Guidance (55208)  Indication(s) Pneumothorax  Consent Risks of the procedure as well as the alternatives and risks of each were explained to the patient and/or caregiver.  Consent for the procedure was obtained and is signed in the bedside chart  Anesthesia Topical only with 1% lidocaine    Time Out Verified patient identification, verified procedure, site/side was marked, verified correct patient position, special equipment/implants available, medications/allergies/relevant history reviewed, required imaging and test results available.   Sterile Technique Maximal sterile technique including full sterile barrier drape, hand hygiene, sterile gown, sterile gloves, mask, hair covering, sterile ultrasound probe cover (if used).   Procedure Description Ultrasound not used to identify appropriate pleural anatomy for placement and overlying skin marked. Area of placement cleaned and draped in sterile fashion.  A 14 French pigtail pleural catheter was placed into the left pleural space using Seldinger technique. Appropriate return of air was obtained.  The tube was connected to atrium and placed on -20 cm H2O wall suction.   Complications/Tolerance None; patient tolerated the procedure well. Chest X-ray is ordered to verify placement.   EBL Minimal  Specimen(s) none   Procedure performed by Katelyn [name] PAC . I assisted & supervised all aspects Katelyn Boom V. Katelyn Soho MD

## 2022-04-03 NOTE — ED Triage Notes (Signed)
Pt was sent by her PCP today due to >50% left pneumothorax. She states she had been coughing for a couple of weeks which prompted her visit to her doctor. She also reports SOB but denies chest pain. O2 sats 98%, pt speaking clear complete sentences.

## 2022-04-03 NOTE — ED Notes (Signed)
Charge RN informed of patient's pneumothorax and to be roomed back asap.

## 2022-04-03 NOTE — ED Provider Notes (Signed)
[facility] EMERGENCY DEPARTMENT Provider Note   CSN: [id number] Arrival date & time: 04/03/22  1733     History  Chief Complaint  Patient presents with   Chest Injury    Katelyn [name] is a 76 y.o. female.  HPI  Patient presented to the emergency department today due to concern for left-sided pneumothorax.  Patient reports that she has had a cough and a "chest cold" for the past 4 weeks.  Patient states that she took a cruise with her husband approximately 4 weeks ago and has had a cough since then.  Patient Nuys any systemic signs of infection such as fevers, chills, sputum production.  She notes that her cough is gotten progressively worse and she is felt more short winded over the past week.  Patient states that she developed significant back pain and shortness of breath on Tuesday.  Patient went to her primary care doctor today who referred the patient for chest x-ray.  The chest x-ray, patient had evidence of a left-sided pneumothorax.  Patient was then referred to the emergency department for further evaluation.     Home Medications Prior to Admission medications   Medication Sig Start Date End Date Taking? Authorizing Provider  alendronate (FOSAMAX) 70 MG tablet TAKE 1 TABLET BY MOUTH EVERY 7 DAYS WITH A FULL GLASS OF WATER ON EMPTY STOMACH Patient taking differently: 70 mg See admin instructions. Take one tablet by mouth (70 mg) every 7 days with a full glass of water on empty stomach. 02/02/22  Yes Susy Frizzle, MD  ALPRAZolam Duanne Moron) 0.5 MG tablet Take 1 tablet (0.5 mg total) by mouth 3 (three) times daily as needed for anxiety. TAKE 1 TABLET BY MOUTH TWICE A DAY AS NEEDED FOR ANXIETY Patient taking differently: Take 0.5 mg by mouth 2 (two) times daily as needed for anxiety. 04/03/22  Yes Susy Frizzle, MD  Ascorbic Acid (VITAMIN C) 500 MG tablet Take 500 mg by mouth daily.     Yes [provider]  aspirin 81 MG tablet Take 81 mg by mouth  daily.     Yes [provider]  B Complex Vitamins (VITAMIN B COMPLEX PO) Take 1 tablet by mouth daily.     Yes [provider]  calcium carbonate (OS-CAL) 600 MG TABS Take 600 mg by mouth 2 (two) times daily with a meal.     Yes [provider]  diclofenac sodium (VOLTAREN) 1 % GEL Apply 2 g topically 4 (four) times daily.   Yes [provider]  DULoxetine (CYMBALTA) 30 MG capsule Take 1 capsule (30 mg total) by mouth 2 (two) times daily. 09/08/17  Yes Susy Frizzle, MD  fish oil-omega-3 fatty acids 1000 MG capsule Take 1 g by mouth 2 (two) times daily.     Yes [provider]  Garlic 7353 MG CAPS Take 1 capsule by mouth daily.     Yes [provider]  hydrochlorothiazide (HYDRODIURIL) 25 MG tablet TAKE 1 TABLET (25 MG TOTAL) BY MOUTH DAILY. 02/09/22  Yes Susy Frizzle, MD  HYDROcodone-acetaminophen (NORCO/VICODIN) 5-325 MG tablet  01/29/21  Yes [provider]  Lysine 500 MG CAPS Take 1 capsule by mouth daily.     Yes [provider]  magnesium oxide (MAG-OX) 400 MG tablet Take 400 mg by mouth daily.     Yes [provider]  Multiple Vitamins-Minerals (MULTIVITAMIN WITH MINERALS) tablet Take 1 tablet by mouth daily.     Yes [provider]  nortriptyline (PAMELOR) 10 MG capsule Take 10 mg by mouth See admin instructions. Take two capsules by mouth (20 mg) every morning and three capsules by mouth (30 mg) every evening.   Yes [provider]  POTASSIUM GLUCONATE PO Take 1 tablet by mouth daily.     Yes [provider]  PRESCRIPTION MEDICATION Trigger point injections.  Once every 3 months.   Yes [provider]  simvastatin (ZOCOR) 20 MG tablet TAKE 1 TABLET BY MOUTH EVERYDAY AT BEDTIME Patient taking differently: Take 20 mg by mouth at bedtime. 06/05/21  Yes Susy Frizzle, MD  SUMAtriptan (IMITREX) 100 MG tablet MAY REPEAT IN 2 HOURS IF HEADACHE PERSISTS OR RECURS. Patient  taking differently: Take 100 mg by mouth See admin instructions. Take one tablet by mouth (100 mg).  May repeat in 2 hours if headache persists or recurs. 12/25/21  Yes Susy Frizzle, MD  traMADol (ULTRAM) 50 MG tablet Take 50 mg by mouth every 8 (eight) hours as needed. for pain 10/22/17  Yes [provider]  vitamin E 400 UNIT capsule Take 400 Units by mouth daily.     Yes [provider]  zolpidem (AMBIEN) 5 MG tablet TAKE 1 TABLET BY MOUTH AT BEDTIME AS NEEDED FOR SLEEP Patient taking differently: Take 5 mg by mouth at bedtime as needed for sleep. 01/24/18  Yes Susy Frizzle, MD      Allergies    Iodine, Prednisone, Sulfa antibiotics, and Red dye    Review of Systems   Review of Systems  Physical Exam Updated Vital Signs BP (!) 133/92   Pulse 76   Temp 98.6 F (37 C) (Oral)   Resp 18   Ht '5\' 3"'$  (1.6 m)   Wt 49.4 kg   SpO2 98%   BMI 19.31 kg/m  Physical Exam Vitals and nursing note reviewed.  Constitutional:      General: She is not in acute distress.    Appearance: She is well-developed.     Comments: General well-appearing, mildly anxious appearing female  HENT:     Head: Normocephalic and atraumatic.  Eyes:     Conjunctiva/sclera: Conjunctivae normal.  Cardiovascular:     Rate and Rhythm: Normal rate and regular rhythm.     Heart sounds: No murmur heard. Pulmonary:     Effort: Pulmonary effort is normal. No respiratory distress.     Comments: Diminished breath sounds on the left Abdominal:     Palpations: Abdomen is soft.     Tenderness: There is no abdominal tenderness.  Musculoskeletal:        General: No swelling.     Cervical back: Neck supple.  Skin:    General: Skin is warm and dry.     Capillary Refill: Capillary refill takes less than 2 seconds.  Neurological:     General: No focal deficit present.     Mental Status: She is alert and oriented to person, place, and time.  Psychiatric:        Mood and Affect: Mood normal.      ED Results / Procedures / Treatments   Labs (all labs ordered are listed, but only abnormal results are displayed) Labs Reviewed  CBC  COMPREHENSIVE METABOLIC PANEL    EKG None  Radiology DG CHEST PORT 1 VIEW  Result Date: 04/03/2022 CLINICAL DATA:  Status post chest tube placement. EXAM: PORTABLE CHEST 1 VIEW COMPARISON:  April 03, 2022 (6:51 p.m.) FINDINGS: Since the prior study, there  is been interval left-sided chest tube placement. Its distal end is seen along the medial aspect of the left apex, just above the aortic arch. The heart size and mediastinal contours are within normal limits. Mild to moderate severity left basilar atelectasis and/or infiltrate is seen. There is a small left pleural effusion. While a residual pneumothorax is not clearly identified, left basilar lucency is seen which may represent a residual left basilar pneumothorax. Postoperative changes are seen within the lower cervical spine and upper thoracic spine. The visualized skeletal structures are otherwise unremarkable. IMPRESSION: 1. Interval left-sided chest tube placement, as described above, with a small residual left basilar pneumothorax. 2. Mild to moderate severity left basilar atelectasis and/or infiltrate. Electronically Signed   By: Virgina Norfolk M.D.   On: 04/03/2022 22:27   DG CHEST PORT 1 VIEW  Result Date: 04/03/2022 CLINICAL DATA:  Known left-sided pneumothorax EXAM: PORTABLE CHEST 1 VIEW COMPARISON:  Film from earlier in the same day. FINDINGS: Cardiac shadow is stable. Large left-sided pneumothorax is again identified stable from the prior exam. The small effusion seen on the prior exam is not well appreciated on this exam. Right lung is clear. Bony structures are within normal limits. IMPRESSION: Persistent large left-sided pneumothorax unchanged from the film obtained 9 hours previous. Electronically Signed   By: Inez Catalina M.D.   On: 04/03/2022 19:14   DG Chest 2 View  Result  Date: 04/03/2022 CLINICAL DATA:  Diminished breath sounds and pleurisy on the right. EXAM: CHEST - 2 VIEW COMPARISON:  None Available. FINDINGS: There is a moderate-large left pneumothorax, greater than 50%. No mediastinal shift. Slight bronchial thickening throughout the right lung but no focal airspace disease. The heart is normal in size. Aortic atherosclerosis. Possible left pleural effusion. Scoliotic curvature in the spine. Cervicothoracic and lumbar fusion hardware, partially included. On limited assessment, no acute osseous finding IMPRESSION: Moderate-large left pneumothorax, greater than 50%. No mediastinal shift. Critical Value/emergent results were discussed by telephone at the time of interpretation on 04/03/2022 at 4:58 pm with provider [facility] , who verbally acknowledged these results. He will refer the patient to the emergency department. Electronically Signed   By: Keith Rake M.D.   On: 04/03/2022 17:04    Procedures Procedures    Medications Ordered in ED Medications  polyethylene glycol (MIRALAX / GLYCOLAX) packet 17 g (has no administration in time range)  enoxaparin (LOVENOX) injection 30 mg (has no administration in time range)  acetaminophen (TYLENOL) tablet 650 mg (has no administration in time range)  ALPRAZolam (XANAX) tablet 0.5 mg (has no administration in time range)  aspirin EC tablet 81 mg (has no administration in time range)  DULoxetine (CYMBALTA) DR capsule 30 mg (has no administration in time range)  traMADol (ULTRAM) tablet 50 mg (has no administration in time range)  zolpidem (AMBIEN) tablet 5 mg (has no administration in time range)  SUMAtriptan (IMITREX) tablet 100 mg (has no administration in time range)  sodium chloride flush (NS) 0.9 % injection 10 mL (has no administration in time range)  nortriptyline (PAMELOR) capsule 20 mg (has no administration in time range)    And  nortriptyline (PAMELOR) capsule 30 mg (has no administration in time  range)  simvastatin (ZOCOR) tablet 20 mg (has no administration in time range)  LORazepam (ATIVAN) tablet 0.5 mg (0.5 mg Oral Given 04/03/22 1948)  lidocaine-EPINEPHrine (XYLOCAINE W/EPI) 2 %-1:200000 (PF) injection 10 mL (10 mLs Infiltration Given by Other 04/03/22 1948)  fentaNYL (SUBLIMAZE) injection 25-100 mcg (50  mcg Intravenous Given 04/03/22 2145)    ED Course/ Medical Decision Making/ A&P                          Medical Decision Making Amount and/or Complexity of Data Reviewed Labs: ordered. Decision-making details documented in ED Course. Radiology: ordered. Decision-making details documented in ED Course. ECG/medicine tests:  Decision-making details documented in ED Course.  Risk Prescription drug management. Decision regarding hospitalization.   Medical Decision Making  This patient is Presenting for Evaluation of chest pain, known pneumothorax, patient has a past medical history hypertension, anxiety which complicates their presentation.  Of which does require a range of treatment options, and is a complaint that involves a high risk of morbidity and mortality.  Arrived in ED by: POV  History obtained from: The patient  Limitations in history: none    At this time I am most concerned for atraumatic pneumothorax. Also considering spontaneous blood, traumatic pneumothorax, underlying malignancy, viral bacterial pneumonia. Plan for laboratory and imaging study work-up   Laboratory work-up significant for:  -CBC, CMP unremarkable.   Radiologic work-up was significant for: -Chest x-ray revealed evidence of a large left-sided pneumothorax, consistent with that of prior imaging studies from earlier today.   Interventions and Interval History: -On arrival to the emergency department, patient was hemodynamically stable, maintaining oxygen saturations appropriately on room air.  Patient not significantly tachycardic or hypotensive.  No evidence of tension physiology at  this time.  Patient is complaining of mild chest pain and shortness of breath from the pain in her back. -Pulmonology contacted due to the patient having a traumatic pneumothorax.  Pigtail chest tube placed by pulmonology at the bedside, please see procedure note for details.  Patient will be admitted to the pulmonology service for further monitoring     Consults: Pulmonology Recommendations: Chest tube placement admit to pulmonology for evaluation   Additional documents reviewed: Clinic note, outside hospital chest x-ray    Disposition: Due to the patients current presenting symptoms, physical exam findings, and the workup stated above, it is thought that the etiology of the patients current presentation is atraumatic pneumothorax   ADMIT: Patient is thought to require admission for chest tube management, respiratory monitoring. Patient will be admitted to pulmonology service. Please see in patient provider note for additional treatment plan details.    The plan for this patient was discussed with Dr. Johnney Killian, who voiced agreement and who oversaw evaluation and treatment of this patient.     Clinical Complexity  A medically appropriate history, review of systems, and physical exam was performed.   I personally reviewed the lab and imaging studies discussed above.   MDM generated using voice dictation software and may contain dictation errors. Please contact me for any clarification or with any questions.               Final Clinical Impression(s) / ED Diagnoses Final diagnoses:  Pneumothorax on left    Rx / DC Orders ED Discharge Orders     None         Jarelle Ates, [place], MD 04/03/22 6073    Charlesetta Shanks, MD 04/05/22 1143

## 2022-04-03 NOTE — H&P (Signed)
NAME:  Katelyn [name], MRN:  [id number], DOB:  1945-07-26, LOS: 0 ADMISSION DATE:  04/03/2022, CONSULTATION DATE:  10/13 REFERRING MD:  Dr. Johnney Killian , CHIEF COMPLAINT:  Pneumothorax   History of Present Illness:  Patient is a 76 year old female with pertinent PMH of HTN, HLD, cervical/lumbar DDD presents to [facility] ED on 10/13 with pneumothorax.  Patient states she went on a cruise about a month ago.  Since then she has been having nonproductive cough and sinus pain the past 4 weeks.  Postnasal drip resolved but still having nonproductive cough.  On 10/10 she states that she has noticed some back pain that she felt was associated with her chronic back pain that improved with tramadol.  Noticed some SOB on exertion. Went to family medicine doctor on 10/13 for further eval on cough.  Initial concern was pleurisy getting some pain with coughing.  BS diminished on left. CXR ordered showing left pneumothorax.  Recommended patient go to [facility] ED on 10/13.  Upon arrival to [facility] ED on 10/13, patient in NAD.  Placed on supplemental O2.  Vital stable.  Not in respiratory distress.  Patient states she is overall healthy.  Denies smoking history.  CXR showing large left pneumothorax.  Labs normal. PCCM consulted for admission and chest tube placement.   Pertinent  Medical History   Past Medical History:  Diagnosis Date   Anxiety    Arthritis    cervical and lumbar DDD   Heart murmur    Hyperlipidemia    Hypertension    Osteoporosis      Significant Hospital Events: Including procedures, antibiotic start and stop dates in addition to other pertinent events   10/13: admitted to [facility] Left pneumothorax; Chest tube placed  Interim History / Subjective:  On 2L [place] No resp distress Vitals stable  Objective   Blood pressure (!) 133/92, pulse 76, temperature 98.6 F (37 C), temperature source Oral, resp. rate 18, height '5\' 3"'$  (1.6 m), weight 49.4 kg, SpO2 98 %.       No intake or output data in the 24  hours ending 04/03/22 2200 Filed Weights   04/03/22 1816  Weight: 49.4 kg    Examination: General:  NAD HEENT: MM pink/moist; [place] in place Neuro: Aox3; MAE CV: s1s2, RRR, no m/r/g PULM:  dim BS on left;  2L GI: soft, bsx4 active  Extremities: warm/dry, no edema  Skin: no rashes or lesions appreciated   Resolved Hospital Problem list     Assessment & Plan:  Left Pneumothorax P: -Chest tube placed -CXR post chest tube placement -CXR in a.m. -Continue -20 suction -Wean O2 for sats greater than 92%  HTN HLD P: -hold hctz for now; consider resuming in amm -continue statin and ASA  Chronic neck pain P: -Continue tramadol  Anxiety P: -Continue Cymbalta, nortriptyline, Xanax  Migraines P: -Continue home sumatriptan   Best Practice (right click and "Reselect all SmartList Selections" daily)   Diet/type: NPO DVT prophylaxis: SCD GI prophylaxis: N/A Lines: N/A Foley:  N/A Code Status:  full code Last date of multidisciplinary goals of care discussion [10.13 spoke with patient and husband at bedside. Consent obtained for chest tube insertion procedure.]  Labs   CBC: Recent Labs  Lab 04/03/22 1819  WBC 8.3  HGB 12.1  HCT 36.5  MCV 90.3  PLT 970    Basic Metabolic Panel: Recent Labs  Lab 04/03/22 1819  NA 135  K 3.8  CL 100  CO2 25  GLUCOSE 84  BUN 8  CREATININE 0.72  CALCIUM 9.8   GFR: Estimated Creatinine Clearance: 48.1 mL/min (by C-G formula based on SCr of 0.72 mg/dL). Recent Labs  Lab 04/03/22 1819  WBC 8.3    Liver Function Tests: Recent Labs  Lab 04/03/22 1819  AST 27  ALT 28  ALKPHOS 60  BILITOT 0.4  PROT 6.7  ALBUMIN 3.9   No results for input(s): "LIPASE", "AMYLASE" in the last 168 hours. No results for input(s): "AMMONIA" in the last 168 hours.  ABG No results found for: "PHART", "PCO2ART", "PO2ART", "HCO3", "TCO2", "ACIDBASEDEF", "O2SAT"   Coagulation Profile: No results for input(s): "INR", "PROTIME" in the  last 168 hours.  Cardiac Enzymes: No results for input(s): "CKTOTAL", "CKMB", "CKMBINDEX", "TROPONINI" in the last 168 hours.  HbA1C: No results found for: "HGBA1C"  CBG: No results for input(s): "GLUCAP" in the last 168 hours.  Review of Systems:   Review of Systems  Constitutional:  Negative for fever.  Respiratory:  Positive for cough and shortness of breath. Negative for sputum production and wheezing.   Cardiovascular:  Negative for chest pain.  Gastrointestinal:  Negative for constipation, diarrhea, nausea and vomiting.     Past Medical History:  She,  has a past medical history of Anxiety, Arthritis, Heart murmur, Hyperlipidemia, Hypertension, and Osteoporosis.   Surgical History:   Past Surgical History:  Procedure Laterality Date   BUNIONECTOMY     right   CERVICAL FUSION     x3   COLONOSCOPY     POLYPECTOMY     radiofrequency ablasion     SPINE SURGERY      Dr. Patrice Paradise (x2, last Nov/15) C3-T4   UPPER GASTROINTESTINAL ENDOSCOPY       Social History:   reports that she has never smoked. She has never used smokeless tobacco. She reports current alcohol use of about 7.0 standard drinks of alcohol per week. She reports that she does not use drugs.   Family History:  Her family history includes Arthritis in her father, maternal grandfather, maternal grandmother, mother, paternal grandfather, and paternal grandmother; Colon polyps in her father and mother; Hearing loss in her mother; Heart disease in her father and mother; Hyperlipidemia in her father and mother; Hypertension in her mother; Stroke in her mother. There is no history of Colon cancer, Esophageal cancer, Stomach cancer, or Rectal cancer.   Allergies Allergies  Allergen Reactions   Iodine Hives   Prednisone Hives    Pt took Benadryl with Prednisone for surgery and did ok with it   Sulfa Antibiotics Swelling   Red Dye Rash     Home Medications  Prior to Admission medications   Medication Sig Start  Date End Date Taking? Authorizing Provider  alendronate (FOSAMAX) 70 MG tablet TAKE 1 TABLET BY MOUTH EVERY 7 DAYS WITH A FULL GLASS OF WATER ON EMPTY STOMACH Patient taking differently: 70 mg See admin instructions. Take one tablet by mouth (70 mg) every 7 days with a full glass of water on empty stomach. 02/02/22  Yes Susy Frizzle, MD  ALPRAZolam Duanne Moron) 0.5 MG tablet Take 1 tablet (0.5 mg total) by mouth 3 (three) times daily as needed for anxiety. TAKE 1 TABLET BY MOUTH TWICE A DAY AS NEEDED FOR ANXIETY Patient taking differently: Take 0.5 mg by mouth 2 (two) times daily as needed for anxiety. 04/03/22  Yes Susy Frizzle, MD  Ascorbic Acid (VITAMIN C) 500 MG tablet Take 500 mg by mouth daily.     Yes  [provider]  aspirin 81 MG tablet Take 81 mg by mouth daily.     Yes [provider]  B Complex Vitamins (VITAMIN B COMPLEX PO) Take 1 tablet by mouth daily.     Yes [provider]  calcium carbonate (OS-CAL) 600 MG TABS Take 600 mg by mouth 2 (two) times daily with a meal.     Yes [provider]  diclofenac sodium (VOLTAREN) 1 % GEL Apply 2 g topically 4 (four) times daily.   Yes [provider]  DULoxetine (CYMBALTA) 30 MG capsule Take 1 capsule (30 mg total) by mouth 2 (two) times daily. 09/08/17  Yes Susy Frizzle, MD  fish oil-omega-3 fatty acids 1000 MG capsule Take 1 g by mouth 2 (two) times daily.     Yes [provider]  Garlic 9381 MG CAPS Take 1 capsule by mouth daily.     Yes [provider]  hydrochlorothiazide (HYDRODIURIL) 25 MG tablet TAKE 1 TABLET (25 MG TOTAL) BY MOUTH DAILY. 02/09/22  Yes Susy Frizzle, MD  HYDROcodone-acetaminophen (NORCO/VICODIN) 5-325 MG tablet  01/29/21  Yes [provider]  Lysine 500 MG CAPS Take 1 capsule by mouth daily.     Yes [provider]  magnesium oxide (MAG-OX) 400 MG tablet Take 400 mg by mouth daily.     Yes [provider]  Multiple  Vitamins-Minerals (MULTIVITAMIN WITH MINERALS) tablet Take 1 tablet by mouth daily.     Yes [provider]  nortriptyline (PAMELOR) 10 MG capsule Take 10 mg by mouth See admin instructions. Take two capsules by mouth (20 mg) every morning and three capsules by mouth (30 mg) every evening.   Yes [provider]  POTASSIUM GLUCONATE PO Take 1 tablet by mouth daily.     Yes [provider]  PRESCRIPTION MEDICATION Trigger point injections.  Once every 3 months.   Yes [provider]  simvastatin (ZOCOR) 20 MG tablet TAKE 1 TABLET BY MOUTH EVERYDAY AT BEDTIME Patient taking differently: Take 20 mg by mouth at bedtime. 06/05/21  Yes Susy Frizzle, MD  SUMAtriptan (IMITREX) 100 MG tablet MAY REPEAT IN 2 HOURS IF HEADACHE PERSISTS OR RECURS. Patient taking differently: Take 100 mg by mouth See admin instructions. Take one tablet by mouth (100 mg).  May repeat in 2 hours if headache persists or recurs. 12/25/21  Yes Susy Frizzle, MD  traMADol (ULTRAM) 50 MG tablet Take 50 mg by mouth every 8 (eight) hours as needed. for pain 10/22/17  Yes [provider]  vitamin E 400 UNIT capsule Take 400 Units by mouth daily.     Yes [provider]  zolpidem (AMBIEN) 5 MG tablet TAKE 1 TABLET BY MOUTH AT BEDTIME AS NEEDED FOR SLEEP Patient taking differently: Take 5 mg by mouth at bedtime as needed for sleep. 01/24/18  Yes Susy Frizzle, MD         JD Rexene Agent [facility] 04/03/2022, 10:00 PM  Please see [email or web address] for pager details.  From 7A-7P if no response, please call [phone]. After hours, please call ELink [phone].

## 2022-04-04 ENCOUNTER — Inpatient Hospital Stay (HOSPITAL_COMMUNITY): Payer: Medicare Other

## 2022-04-04 DIAGNOSIS — J939 Pneumothorax, unspecified: Secondary | ICD-10-CM | POA: Diagnosis not present

## 2022-04-04 DIAGNOSIS — Z9689 Presence of other specified functional implants: Secondary | ICD-10-CM

## 2022-04-04 MED ORDER — KETOROLAC TROMETHAMINE 15 MG/ML IJ SOLN: 15.0000 mg | Freq: Three times a day (TID) | INTRAMUSCULAR | Status: DC | PRN

## 2022-04-04 MED ORDER — HYDROCHLOROTHIAZIDE 25 MG PO TABS
25.0000 mg | ORAL_TABLET | Freq: Every day | ORAL | Status: DC
  Administered 2022-04-04 – 2022-04-05 (×2): 25 mg via ORAL
  Filled 2022-04-04 (×2): qty 1

## 2022-04-04 NOTE — Progress Notes (Signed)
NAME:  Katelyn [name], MRN:  [id number], DOB:  Oct 18, 1945, LOS: 1 ADMISSION DATE:  04/03/2022, CONSULTATION DATE:  10/13 REFERRING MD:  Dr. Johnney Killian , CHIEF COMPLAINT:  Pneumothorax   History of Present Illness:  Katelyn [name] is a 76 year old female with pertinent PMH of HTN, HLD, cervical/lumbar DDD presents to [facility] ED on 10/13 with pneumothorax.  Katelyn [name] states she went on a cruise about a month ago.  Since then she has been having nonproductive cough and sinus pain the past 4 weeks.  Postnasal drip resolved but still having nonproductive cough.  On 10/10 she states that she has noticed some back pain that she felt was associated with her chronic back pain that improved with tramadol.  Noticed some SOB on exertion. Went to family medicine doctor on 10/13 for further eval on cough.  Initial concern was pleurisy getting some pain with coughing.  BS diminished on left. CXR ordered showing left pneumothorax.  Recommended Katelyn [name] go to [facility] ED on 10/13.  Upon arrival to [facility] ED on 10/13, Katelyn [name] in NAD.  Placed on supplemental O2.  Vital stable.  Not in respiratory distress.  Katelyn [name] states she is overall healthy.  Denies smoking history.  CXR showing large left pneumothorax.  Labs normal. PCCM consulted for admission and chest tube placement.   Pertinent  Medical History   Past Medical History:  Diagnosis Date   Anxiety    Arthritis    cervical and lumbar DDD   Heart murmur    Hyperlipidemia    Hypertension    Osteoporosis      Significant Hospital Events: Including procedures, antibiotic start and stop dates in addition to other pertinent events   10/13: admitted to [facility] Left pneumothorax; Chest tube placed 10/14 CXR with only small residual L ptx on suction   Interim History / Subjective:  CXR with small residual L ptx   Objective   Blood pressure 136/88, pulse 82, temperature 97.7 F (36.5 C), temperature source Oral, resp. rate 15, height '5\' 3"'$  (1.6 m), weight 49.4 kg, SpO2 95 %.         Intake/Output Summary (Last 24 hours) at 04/04/2022 1043 Last data filed at 04/04/2022 0738 Gross per 24 hour  Intake --  Output 0 ml  Net 0 ml   Filed Weights   04/03/22 1816  Weight: 49.4 kg    Examination: General:  WDWN older adult F NAD  HEENT: NCAT Neuro: AAOx3  CV: rrr s1s2 PULM:  CTAb. L chest tube. No air leak  GI: soft thin ndnt  Extremities: no acute joint deformity no cyanosis or clubbing  Skin:c/d/w    Resolved Hospital Problem list     Assessment & Plan:    Spontaneous L pneumothorax  S/p pigtail 10/14. Small residual ptx on CXR 10/15. No air leak  P: -Likely trial water seal overnight with AM CXR  -routine chest tube care -PRN dilaudid, APAP, toradol, ultram   HTN HLD P: -ordered home HCTZ, statin, ASA   Chronic neck pain Migraine P: -Home tramodol, nortriptyline, PRN imitrex  Xanax Disordered sleep  P: -Cymbalta, Xanax, Ambien     Best Practice (right click and "Reselect all SmartList Selections" daily)   Diet/type: NPO DVT prophylaxis: SCD GI prophylaxis: N/A Lines: N/A Foley:  N/A Code Status:  full code Last date of multidisciplinary goals of care discussion [10.13 spoke with Katelyn [name] and husband at bedside. D/w pt again 10/14] Dispo: will ask [facility] to take over care, PCCM will continue to follow chest tube  Labs   CBC: Recent Labs  Lab 04/03/22 1819  WBC 8.3  HGB 12.1  HCT 36.5  MCV 90.3  PLT 093    Basic Metabolic Panel: Recent Labs  Lab 04/03/22 1819  NA 135  K 3.8  CL 100  CO2 25  GLUCOSE 84  BUN 8  CREATININE 0.72  CALCIUM 9.8   GFR: Estimated Creatinine Clearance: 48.1 mL/min (by C-G formula based on SCr of 0.72 mg/dL). Recent Labs  Lab 04/03/22 1819  WBC 8.3    Liver Function Tests: Recent Labs  Lab 04/03/22 1819  AST 27  ALT 28  ALKPHOS 60  BILITOT 0.4  PROT 6.7  ALBUMIN 3.9   No results for input(s): "LIPASE", "AMYLASE" in the last 168 hours. No results for input(s):  "AMMONIA" in the last 168 hours.  ABG No results found for: "PHART", "PCO2ART", "PO2ART", "HCO3", "TCO2", "ACIDBASEDEF", "O2SAT"   Coagulation Profile: No results for input(s): "INR", "PROTIME" in the last 168 hours.  Cardiac Enzymes: No results for input(s): "CKTOTAL", "CKMB", "CKMBINDEX", "TROPONINI" in the last 168 hours.  HbA1C: No results found for: "HGBA1C"  CBG: No results for input(s): "GLUCAP" in the last 168 hours.  Eliseo Gum MSN, AGACNP-BC [place] for pager  04/04/2022, 10:43 AM

## 2022-04-05 ENCOUNTER — Inpatient Hospital Stay (HOSPITAL_COMMUNITY): Payer: Medicare Other

## 2022-04-05 ENCOUNTER — Telehealth: Payer: Self-pay | Admitting: Internal Medicine

## 2022-04-05 DIAGNOSIS — J9383 Other pneumothorax: Secondary | ICD-10-CM | POA: Diagnosis not present

## 2022-04-05 LAB — URINALYSIS, ROUTINE W REFLEX MICROSCOPIC
Bilirubin Urine: NEGATIVE
Glucose, UA: NEGATIVE mg/dL
Hgb urine dipstick: NEGATIVE
Ketones, ur: NEGATIVE mg/dL
Leukocytes,Ua: NEGATIVE
Nitrite: NEGATIVE
Protein, ur: NEGATIVE mg/dL
Specific Gravity, Urine: 1.006 (ref 1.005–1.030)
pH: 7 (ref 5.0–8.0)

## 2022-04-05 NOTE — Progress Notes (Signed)
Karyl Kinnier to be D/C'd  per MD order.  Discussed with the patient and all questions fully answered.  VSS, Skin clean, dry and intact without evidence of skin break down, no evidence of skin tears noted.  IV catheter discontinued intact. Site without signs and symptoms of complications. Dressing and pressure applied.  An After Visit Summary was printed and given to the patient.   D/c education completed with patient/family including follow up instructions, medication list, d/c activities limitations if indicated, with other d/c instructions as indicated by MD - patient able to verbalize understanding, all questions fully answered.   Patient instructed to return to ED, call 911, or call MD for any changes in condition.   Patient to be escorted via [place], and D/C home via private auto.

## 2022-04-05 NOTE — Discharge Instructions (Signed)
You can take over the counter medication such as ibuprofen 800 mg up to three times daily with food to help with the pain related to your pneumothorax.  If you experience sudden onset worsening of chest pain, shortness of breath, inability to breathe, please return to emergency care.

## 2022-04-05 NOTE — Progress Notes (Signed)
  Transition of Care [facility]) Screening Note   Patient Details  Name: Katelyn [name] Date of Birth: [birth year]   Transition of Care [facility]) CM/SW Contact:    Bartholomew Crews, RN Phone Number: [phone] 04/05/2022, 11:40 AM    Transition of Care Department [facility]) has reviewed patient and no TOC needs have been identified at this time. We will continue to monitor patient advancement through interdisciplinary progression rounds. If new patient transition needs arise, please place a TOC consult.

## 2022-04-05 NOTE — Discharge Summary (Signed)
Physician Discharge Summary  Patient ID: Katelyn [name] MRN: [id number] DOB/AGE: 07-05-1945 76 y.o.  Admit date: 04/03/2022 Discharge date: 04/05/2022  Admission Diagnoses:  Discharge Diagnoses:  Principal Problem:   Spontaneous pneumothorax Active Problems:   Pneumothorax on left   Chest tube in place   Discharged Condition: good  Hospital Course: Patient is a 76 year old female with pertinent PMH of HTN, HLD, cervical/lumbar DDD presents to [facility] ED on 10/13 with pneumothorax.   Patient states she went on a cruise about a month ago.  Since then she has been having nonproductive cough and sinus pain the past 4 weeks.  Postnasal drip resolved but still having nonproductive cough.  On 10/10 she states that she has noticed some back pain that she felt was associated with her chronic back pain that improved with tramadol.  Noticed some SOB on exertion. Went to family medicine doctor on 10/13 for further eval on cough.  Initial concern was pleurisy getting some pain with coughing.  BS diminished on left. CXR ordered showing left pneumothorax.  Recommended patient go to [facility] ED on 10/13.   Upon arrival to [facility] ED on 10/13, patient in NAD.  Placed on supplemental O2.  Vital stable.  Not in respiratory distress.  Patient states she is overall healthy.  Denies smoking history.  CXR showing large left pneumothorax.  Labs normal. PCCM consulted for admission and chest tube placement.    Chest tube was placed on 10/13 evening.  Was kept on suction for 24 hours and placed to waterseal. Lung showed re-expansion on chest xray. Chest tube pulled the morning of 04/05/22. Repeat chest xray shows lung remains re-expanded.     Discharge Exam: Blood pressure 128/84, pulse 93, temperature 98.5 F (36.9 C), temperature source Oral, resp. rate 14, height '5\' 3"'$  (1.6 m), weight 49.4 kg, SpO2 98 %. General appearance: alert, cooperative, appears stated age, and no distress Resp: clear to auscultation  bilaterally Chest wall: left sided chest tube in place without drainage, no airleak, mild ttp on left back Cardio: regular rate and rhythm, S1, S2 normal, no murmur, click, rub or gallop Extremities: extremities normal, atraumatic, no cyanosis or edema Neurologic: Grossly normal  Disposition:  Improved, dicsharged to home.   Allergies as of 04/05/2022       Reactions   Iodine Hives   Prednisone Hives   Pt took Benadryl with Prednisone for surgery and did ok with it   Sulfa Antibiotics Swelling   Red Dye Rash        Medication List     TAKE these medications    alendronate 70 MG tablet Commonly known as: FOSAMAX TAKE 1 TABLET BY MOUTH EVERY 7 DAYS WITH A FULL GLASS OF WATER ON EMPTY STOMACH What changed: See the new instructions.   ALPRAZolam 0.5 MG tablet Commonly known as: XANAX Take 1 tablet (0.5 mg total) by mouth 3 (three) times daily as needed for anxiety. TAKE 1 TABLET BY MOUTH TWICE A DAY AS NEEDED FOR ANXIETY What changed:  when to take this additional instructions   ascorbic acid 500 MG tablet Commonly known as: VITAMIN C Take 500 mg by mouth daily.   aspirin 81 MG tablet Take 81 mg by mouth daily.   calcium carbonate 600 MG Tabs tablet Commonly known as: OS-CAL Take 600 mg by mouth 2 (two) times daily with a meal.   diclofenac sodium 1 % Gel Commonly known as: VOLTAREN Apply 2 g topically 4 (four) times daily.   DULoxetine 30 MG  capsule Commonly known as: CYMBALTA Take 1 capsule (30 mg total) by mouth 2 (two) times daily.   fish oil-omega-3 fatty acids 1000 MG capsule Take 1 g by mouth 2 (two) times daily.   Garlic 8127 MG Caps Take 1 capsule by mouth daily.   hydrochlorothiazide 25 MG tablet Commonly known as: HYDRODIURIL TAKE 1 TABLET (25 MG TOTAL) BY MOUTH DAILY.   HYDROcodone-acetaminophen 5-325 MG tablet Commonly known as: NORCO/VICODIN   Lysine 500 MG Caps Take 1 capsule by mouth daily.   magnesium oxide 400 MG tablet Commonly  known as: MAG-OX Take 400 mg by mouth daily.   multivitamin with minerals tablet Take 1 tablet by mouth daily.   nortriptyline 10 MG capsule Commonly known as: PAMELOR Take 10 mg by mouth See admin instructions. Take two capsules by mouth (20 mg) every morning and three capsules by mouth (30 mg) every evening.   POTASSIUM GLUCONATE PO Take 1 tablet by mouth daily.   PRESCRIPTION MEDICATION Trigger point injections.  Once every 3 months.   simvastatin 20 MG tablet Commonly known as: ZOCOR TAKE 1 TABLET BY MOUTH EVERYDAY AT BEDTIME What changed: See the new instructions.   SUMAtriptan 100 MG tablet Commonly known as: IMITREX MAY REPEAT IN 2 HOURS IF HEADACHE PERSISTS OR RECURS. What changed: See the new instructions.   traMADol 50 MG tablet Commonly known as: ULTRAM Take 50 mg by mouth every 8 (eight) hours as needed. for pain   VITAMIN B COMPLEX PO Take 1 tablet by mouth daily.   vitamin E 180 MG (400 UNITS) capsule Take 400 Units by mouth daily.   zolpidem 5 MG tablet Commonly known as: AMBIEN TAKE 1 TABLET BY MOUTH AT BEDTIME AS NEEDED FOR SLEEP What changed:  reasons to take this additional instructions        Follow-up Information     [facility]. Schedule an appointment as soon as possible for a visit in 1 week(s).   Specialty: Pulmonology Why: Office will call you for follow up. Contact information: [address] [place] [address] [phone]                Signed: Spero Geralds 04/05/2022, 10:06 AM

## 2022-04-05 NOTE — Progress Notes (Addendum)
Left pigtail catheter removed at 0932 on 04/05/2022 by myself. No complications. SpO2 98% and 96% pre and post catheter removal. Breathing regular and unlabored. Occlusive dressing with petroleum gauze placed over site. Post chest tube removal CXR is in placed. Primary RN, Tamekia, to notify X-ray at 1030 to completed post removal CXR, per order.   Leave dressing in place for 24 hours, unless further specified by MD. Monitor site around dressing. Change dressing and notify provider if dressing becomes saturated.   Roemello Speyer L Marquist [facility] Rapid Response RN

## 2022-04-05 NOTE — Telephone Encounter (Signed)
Please scheduled hospital follow up with APP in 1-2 weeks for pneumothorax.

## 2022-04-06 LAB — URINE CULTURE: Culture: NO GROWTH

## 2022-04-08 ENCOUNTER — Telehealth: Payer: Self-pay

## 2022-04-08 NOTE — Telephone Encounter (Signed)
Transition Care Management Follow-up Telephone Call Date of discharge and from where: 04/05/2022 Katelyn [name] How have you been since you were released from the hospital? Pt states she is doing well.  Any questions or concerns? No  Items Reviewed: Did the pt receive and understand the discharge instructions provided? Yes  Medications obtained and verified? Yes  Other? No  Any new allergies since your discharge? No  Dietary orders reviewed? No Do you have support at home? Yes   Home Care and Equipment/Supplies: Were home health services ordered? not applicable If so, what is the name of the agency? N/a  Has the agency set up a time to come to the patient's home? not applicable Were any new equipment or medical supplies ordered?  No What is the name of the medical supply agency? N/a Were you able to get the supplies/equipment? not applicable Do you have any questions related to the use of the equipment or supplies? No  Functional Questionnaire: (I = Independent and D = Dependent) ADLs: I  Bathing/Dressing- I  Meal Prep- I  Eating- I  Maintaining continence- I  Transferring/Ambulation- I  Managing Meds- I  Follow up appointments reviewed:  PCP Hospital f/u appt confirmed? No  Pt asks to follow up with Pulmonology first.  [facility] f/u appt confirmed? Yes  Scheduled to see Roxan Diesel with [facility] on 04/14/2022 @ 0900. Are transportation arrangements needed? No  If their condition worsens, is the pt aware to call PCP or go to the Emergency Dept.? Yes Was the patient provided with contact information for the PCP's office or ED? Yes Was to pt encouraged to call back with questions or concerns? Yes

## 2022-04-14 ENCOUNTER — Ambulatory Visit (INDEPENDENT_AMBULATORY_CARE_PROVIDER_SITE_OTHER)
Admission: RE | Admit: 2022-04-14 | Discharge: 2022-04-14 | Disposition: A | Discharge status: Home / Self Care | Payer: Medicare Other | Source: Ambulatory Visit | Attending: Nurse Practitioner | Admitting: Nurse Practitioner

## 2022-04-14 ENCOUNTER — Encounter: Payer: Self-pay | Admitting: Nurse Practitioner

## 2022-04-14 ENCOUNTER — Ambulatory Visit (INDEPENDENT_AMBULATORY_CARE_PROVIDER_SITE_OTHER): Payer: Medicare Other | Admitting: Nurse Practitioner

## 2022-04-14 VITALS — BP 118/78 | HR 80 | Ht 63.0 in | Wt 106.0 lb

## 2022-04-14 DIAGNOSIS — J939 Pneumothorax, unspecified: Secondary | ICD-10-CM

## 2022-04-14 NOTE — Assessment & Plan Note (Addendum)
Given the timeline between her trigger point injections and onset of symptoms, I suspect that this was a postprocedural pneumothorax. She is a never smoker and has no history of lung disease. She is clinically improved today. CXR without recurrence and improved left lung opacities/aeration. We will obtain CT chest to ensure there is no underlying pulmonary etiology.   Patient Instructions  Chest x ray today   CT chest ordered - someone will contact you for scheduling  Do not lift anything that is heavier than 10 lb (4.5 kg) for the next week then you can resume normal activities  Avoid activities that take a lot of effort (are strenuous) for as long as told by your health care provider. Do not fly in an airplane or scuba dive for the next week  Follow up in 4 weeks to review CT chest results with Dr. Elsworth Soho or Joellen Jersey Charle Mclaurin,NP. If symptoms return/worsen, please contact office for sooner follow up or seek emergency care.

## 2022-04-14 NOTE — Patient Instructions (Signed)
Chest x ray today   CT chest ordered - someone will contact you for scheduling  Do not lift anything that is heavier than 10 lb (4.5 kg) for the next week then you can resume normal activities  Avoid activities that take a lot of effort (are strenuous) for as long as told by your health care provider. Do not fly in an airplane or scuba dive for the next week  Follow up in 4 weeks to review CT chest results with Dr. Elsworth Soho or Joellen Jersey Kamylah Manzo,NP. If symptoms return/worsen, please contact office for sooner follow up or seek emergency care.

## 2022-04-14 NOTE — Progress Notes (Signed)
$'@Patient'b$  ID: Katelyn [name], female    DOB: [birth year], 76 y.o.   MRN: [id number]  Chief Complaint  Patient presents with   Consult    Pt HFU, she is feeling some better. Denies SOB and wheezing. She does express having a cough.    Referring provider: Susy Frizzle, MD  HPI: 76 year old female, never smoker followed for pneumothorax. Past medical history significant for HTN, migraines, cervical dystonia, HLD, GAD.  She was recently hospitalized from 04/03/2022-04/05/2022 for left pneumothorax, treated with chest tube placement. She had gone on a cruise about a month prior, developed nonproductive cough and sinus pain. URI symptoms had resolved but she was still having a cough. On 10/10, she noticed that she had some increased back pain, which she thought was related to her chronic back pain. She was seen by her PCP on 10/13, who performed a CXR which revealed a left sided pneumothorax. She was referred to the ED, in [place], where repeat CXR showed a large left ptx. She was treated with CT placement. Lung expanded and CT was removed on 04/05/2022. She was discharged home.   TEST/EVENTS:  04/03/2022 CXR: moderate to large left pneumothorax, greater than 50%. Possible left pleural effusion.  04/05/2022 CXR: left sided chest tube has been removed; no residual ptx. Persistent opacities in the left lung with trace left pleural effusion.   04/14/2022: Today - follow up Patient presents today for hospital follow up with her husband. She has been doing well since she was discharged home. No respiratory symptoms; cough has resolved. Back pain is at her baseline. They are preparing to go on a road trip up to [place] later this week; no plans of flying.  She has no known lung history and is a never smoker. She tells me she had some URI symptoms about 4 weeks before she was diagnosed with the pneumothorax. She had been starting to feel better with a mild residual cough. She then went to have trigger  point injections around 10/6. She tells me that she has gotten these for many years but a different provider from her normal performed the procedure this day. After this, her husband noticed that her cough started to get worse and then around 10/10 she developed worsening back pain which is what prompted them to go see her PCP on 10/13.   Allergies  Allergen Reactions   Iodine Hives   Prednisone Hives    Pt took Benadryl with Prednisone for surgery and did ok with it   Sulfa Antibiotics Swelling   Red Dye Rash    Immunization History  Administered Date(s) Administered   Influenza, High Dose Seasonal PF 02/02/2019   PFIZER Comirnaty(Gray Top)Covid-19 Tri-Sucrose Vaccine 08/14/2019, 09/04/2019, 03/19/2020   PFIZER(Purple Top)SARS-COV-2 Vaccination 08/14/2019, 09/04/2019, 03/19/2020   PNEUMOCOCCAL CONJUGATE-20 07/29/2020   Pneumococcal Conjugate-13 10/17/2015   Pneumococcal Polysaccharide-23 09/24/2014   Unspecified SARS-COV-2 Vaccination 08/14/2019   Zoster Recombinat (Shingrix) 07/29/2020   Zoster, Live 06/21/2013    Past Medical History:  Diagnosis Date   Anxiety    Arthritis    cervical and lumbar DDD   Heart murmur    Hyperlipidemia    Hypertension    Osteoporosis     Tobacco History: Social History   Tobacco Use  Smoking Status Never  Smokeless Tobacco Never   Counseling given: Not Answered   Outpatient Medications Prior to Visit  Medication Sig Dispense Refill   alendronate (FOSAMAX) 70 MG tablet TAKE 1 TABLET BY MOUTH EVERY 7  DAYS WITH A FULL GLASS OF WATER ON EMPTY STOMACH (Patient taking differently: 70 mg See admin instructions. Take one tablet by mouth (70 mg) every 7 days with a full glass of water on empty stomach.) 12 tablet 3   ALPRAZolam (XANAX) 0.5 MG tablet Take 1 tablet (0.5 mg total) by mouth 3 (three) times daily as needed for anxiety. TAKE 1 TABLET BY MOUTH TWICE A DAY AS NEEDED FOR ANXIETY (Patient taking differently: Take 0.5 mg by mouth 2 (two)  times daily as needed for anxiety.) 90 tablet 0   Ascorbic Acid (VITAMIN C) 500 MG tablet Take 500 mg by mouth daily.       aspirin 81 MG tablet Take 81 mg by mouth daily.       B Complex Vitamins (VITAMIN B COMPLEX PO) Take 1 tablet by mouth daily.       calcium carbonate (OS-CAL) 600 MG TABS Take 600 mg by mouth 2 (two) times daily with a meal.       diclofenac sodium (VOLTAREN) 1 % GEL Apply 2 g topically 4 (four) times daily.     DULoxetine (CYMBALTA) 30 MG capsule Take 1 capsule (30 mg total) by mouth 2 (two) times daily. 180 capsule 3   fish oil-omega-3 fatty acids 1000 MG capsule Take 1 g by mouth 2 (two) times daily.       Garlic 3762 MG CAPS Take 1 capsule by mouth daily.       hydrochlorothiazide (HYDRODIURIL) 25 MG tablet TAKE 1 TABLET (25 MG TOTAL) BY MOUTH DAILY. 90 tablet 0   HYDROcodone-acetaminophen (NORCO/VICODIN) 5-325 MG tablet      Lysine 500 MG CAPS Take 1 capsule by mouth daily.       magnesium oxide (MAG-OX) 400 MG tablet Take 400 mg by mouth daily.       Multiple Vitamins-Minerals (MULTIVITAMIN WITH MINERALS) tablet Take 1 tablet by mouth daily.       nortriptyline (PAMELOR) 10 MG capsule Take 10 mg by mouth See admin instructions. Take two capsules by mouth (20 mg) every morning and three capsules by mouth (30 mg) every evening.     POTASSIUM GLUCONATE PO Take 1 tablet by mouth daily.       PRESCRIPTION MEDICATION Trigger point injections.  Once every 3 months.     simvastatin (ZOCOR) 20 MG tablet TAKE 1 TABLET BY MOUTH EVERYDAY AT BEDTIME (Patient taking differently: Take 20 mg by mouth at bedtime.) 90 tablet 2   SUMAtriptan (IMITREX) 100 MG tablet MAY REPEAT IN 2 HOURS IF HEADACHE PERSISTS OR RECURS. (Patient taking differently: Take 100 mg by mouth See admin instructions. Take one tablet by mouth (100 mg).  May repeat in 2 hours if headache persists or recurs.) 12 tablet 9   traMADol (ULTRAM) 50 MG tablet Take 50 mg by mouth every 8 (eight) hours as needed. for pain  0    vitamin E 400 UNIT capsule Take 400 Units by mouth daily.       zolpidem (AMBIEN) 5 MG tablet TAKE 1 TABLET BY MOUTH AT BEDTIME AS NEEDED FOR SLEEP (Patient taking differently: Take 5 mg by mouth at bedtime as needed for sleep.) 30 tablet 3   No facility-administered medications prior to visit.     Review of Systems:   Constitutional: No weight loss or gain, night sweats, fevers, chills, fatigue, or lassitude. HEENT: No headaches, sneezing, itching, ear ache, nasal congestion, or post nasal drip CV:  No chest pain, orthopnea, PND, swelling in  lower extremities, anasarca, dizziness, palpitations, syncope Resp: No shortness of breath with exertion or at rest. No excess mucus or change in color of mucus. No productive or non-productive. No hemoptysis. No wheezing.  No chest wall deformity GI:  No heartburn, indigestion, abdominal pain, nausea, vomiting, diarrhea, change in bowel habits, loss of appetite, bloody stools.  MSK:  No joint pain or swelling.  No decreased range of motion.  No increased back pain. Neuro: No dizziness or lightheadedness.  Psych: No depression or anxiety. Mood stable.     Physical Exam:  BP 118/78   Pulse 80   Ht '5\' 3"'$  (1.6 m)   Wt 106 lb (48.1 kg)   SpO2 99%   BMI 18.78 kg/m   GEN: Pleasant, interactive, well-appearing; in no acute distress. HEENT:  Normocephalic and atraumatic. PERRLA. Sclera white. Nasal turbinates pink, moist and patent bilaterally. No rhinorrhea present. Oropharynx pink and moist, without exudate or edema. No lesions, ulcerations, or postnasal drip.  NECK:  Supple w/ fair ROM. No JVD present. Normal carotid impulses w/o bruits. Thyroid symmetrical with no goiter or nodules palpated. No lymphadenopathy.   CV: RRR, no m/r/g, no peripheral edema. Pulses intact, +2 bilaterally. No cyanosis, pallor or clubbing. PULMONARY:  Unlabored, regular breathing. Clear bilaterally A&P w/o wheezes/rales/rhonchi. No accessory muscle use.  GI: BS present  and normoactive. Soft, non-tender to palpation. No organomegaly or masses detected.  MSK: No erythema, warmth or tenderness. No deformities or joint swelling noted.  Neuro: A/Ox3. No focal deficits noted.   Skin: Warm, no lesions or rashe Psych: Normal affect and behavior. Judgement and thought content appropriate.     Lab Results:  CBC    Component Value Date/Time   WBC 8.3 04/03/2022 1819   RBC 4.04 04/03/2022 1819   HGB 12.1 04/03/2022 1819   HCT 36.5 04/03/2022 1819   PLT 344 04/03/2022 1819   MCV 90.3 04/03/2022 1819   MCH 30.0 04/03/2022 1819   MCHC 33.2 04/03/2022 1819   RDW 14.6 04/03/2022 1819   LYMPHSABS 1,931 11/10/2021 1522   MONOABS 627 10/27/2016 0901   EOSABS 88 11/10/2021 1522   BASOSABS 27 11/10/2021 1522    BMET    Component Value Date/Time   NA 135 04/03/2022 1819   K 3.8 04/03/2022 1819   CL 100 04/03/2022 1819   CO2 25 04/03/2022 1819   GLUCOSE 84 04/03/2022 1819   BUN 8 04/03/2022 1819   CREATININE 0.72 04/03/2022 1819   CREATININE 0.63 11/10/2021 1522   CALCIUM 9.8 04/03/2022 1819   GFRNONAA >60 04/03/2022 1819   GFRNONAA 81 02/12/2020 0901   GFRAA 94 02/12/2020 0901    BNP No results found for: "BNP"   Imaging:  DG Chest 2 View  Result Date: 04/14/2022 CLINICAL DATA:  Follow-up pneumothorax. EXAM: CHEST - 2 VIEW COMPARISON:  04/05/2022 and older studies. FINDINGS: No pneumothorax. Left lung opacities noted on the prior exam have resolved. Minor linear opacity in the left upper lobe lingula consistent with atelectasis or scarring. Lungs otherwise clear. No pleural effusion. Cardiac silhouette normal in size. No mediastinal or hilar masses or evidence of adenopathy. IMPRESSION: 1. No acute cardiopulmonary disease. 2. Left lung airspace opacities have resolved. 3. No pneumothorax. Electronically Signed   By: Lajean Manes M.D.   On: 04/14/2022 10:23   DG CHEST PORT 1 VIEW  Result Date: 04/05/2022 CLINICAL DATA:  Pneumothorax EXAM:  PORTABLE CHEST 1 VIEW COMPARISON:  Same-day radiograph at 0717 hours FINDINGS: Interval removal of left-sided chest  tube. No visible pneumothorax. Stable heart size. Aortic atherosclerosis. Persistent airspace opacities within the left lung, similar to prior. Trace left pleural effusion. Right lung is clear. IMPRESSION: 1. Interval removal of left-sided chest tube. No visible pneumothorax. 2. Persistent airspace opacities within the left lung. Trace left pleural effusion. Electronically Signed   By: Davina Poke D.O.   On: 04/05/2022 11:04   DG CHEST PORT 1 VIEW  Result Date: 04/05/2022 CLINICAL DATA:  Pneumothorax EXAM: PORTABLE CHEST 1 VIEW COMPARISON:  04/04/22 CXR FINDINGS: Redemonstrated left-sided pleural pigtail drainage catheter positioned along the medial aspect of the left upper lung. There is a small amount of subcutaneous emphysema, improved from prior. No large pneumothorax visualized. No pleural effusion. No new focal airspace opacity. Previously seen airspace opacities in the left lung appear less conspicuous, but persist. No displaced rib fractures. Partially imaged cervical spinal fusion hardware appears unchanged in appearance. Redemonstrated dextrocurvature of the thoracic spine. Subdiaphragmatic lucency on the left is favored to represent intraluminal air. IMPRESSION: 1. Left-sided pleural pigtail drainage catheter in place. No pneumothorax visualized. Slightly decreased subcutaneous emphysema. 2. Previously seen airspace opacities in the left lung appear less conspicuous, but persist. 3. No new focal airspace opacity. Electronically Signed   By: Marin Roberts M.D.   On: 04/05/2022 07:50   DG Chest Port 1 View  Result Date: 04/04/2022 CLINICAL DATA:  Follow-up pneumothorax. EXAM: PORTABLE CHEST 1 VIEW COMPARISON:  04/04/2022 at 6:28 a.m., and older studies. FINDINGS: No visible pneumothorax.  Left chest tube is stable. Left mid to lower lung opacity is unchanged from the exam  earlier today. No new lung abnormalities. IMPRESSION: 1. No visible pneumothorax. 2. No other change from the study performed earlier this morning. Electronically Signed   By: Lajean Manes M.D.   On: 04/04/2022 10:38   DG CHEST PORT 1 VIEW  Result Date: 04/04/2022 CLINICAL DATA:  76 year old female with history of left-sided pneumothorax. EXAM: PORTABLE CHEST 1 VIEW COMPARISON:  Chest x-ray 04/03/2022. FINDINGS: Small bore chest tube with tip reformed in the medial aspect of the upper left hemithorax. Trace left apical pneumothorax occupying less than 5% of the volume of the left hemithorax. Patchy interstitial prominence and airspace consolidation throughout the left mid to lower lung. Small left pleural effusion. Right lung is clear. No right pleural effusion. No right pneumothorax. No evidence of pulmonary edema. Heart size is normal. Upper mediastinal contours are within normal limits. Orthopedic fixation hardware in the cervical spine incidentally noted. IMPRESSION: 1. Trace left apical pneumothorax with stable position of left-sided chest tube. 2. Patchy areas of airspace consolidation throughout the left mid to lower lung concerning for pneumonia, with small left parapneumonic pleural effusion. Electronically Signed   By: Vinnie Langton M.D.   On: 04/04/2022 06:35   DG CHEST PORT 1 VIEW  Result Date: 04/03/2022 CLINICAL DATA:  Status post chest tube placement. EXAM: PORTABLE CHEST 1 VIEW COMPARISON:  April 03, 2022 (6:51 p.m.) FINDINGS: Since the prior study, there is been interval left-sided chest tube placement. Its distal end is seen along the medial aspect of the left apex, just above the aortic arch. The heart size and mediastinal contours are within normal limits. Mild to moderate severity left basilar atelectasis and/or infiltrate is seen. There is a small left pleural effusion. While a residual pneumothorax is not clearly identified, left basilar lucency is seen which may represent a  residual left basilar pneumothorax. Postoperative changes are seen within the lower cervical spine and upper thoracic spine.  The visualized skeletal structures are otherwise unremarkable. IMPRESSION: 1. Interval left-sided chest tube placement, as described above, with a small residual left basilar pneumothorax. 2. Mild to moderate severity left basilar atelectasis and/or infiltrate. Electronically Signed   By: Virgina Norfolk M.D.   On: 04/03/2022 22:27   DG CHEST PORT 1 VIEW  Result Date: 04/03/2022 CLINICAL DATA:  Known left-sided pneumothorax EXAM: PORTABLE CHEST 1 VIEW COMPARISON:  Film from earlier in the same day. FINDINGS: Cardiac shadow is stable. Large left-sided pneumothorax is again identified stable from the prior exam. The small effusion seen on the prior exam is not well appreciated on this exam. Right lung is clear. Bony structures are within normal limits. IMPRESSION: Persistent large left-sided pneumothorax unchanged from the film obtained 9 hours previous. Electronically Signed   By: Inez Catalina M.D.   On: 04/03/2022 19:14   DG Chest 2 View  Result Date: 04/03/2022 CLINICAL DATA:  Diminished breath sounds and pleurisy on the right. EXAM: CHEST - 2 VIEW COMPARISON:  None Available. FINDINGS: There is a moderate-large left pneumothorax, greater than 50%. No mediastinal shift. Slight bronchial thickening throughout the right lung but no focal airspace disease. The heart is normal in size. Aortic atherosclerosis. Possible left pleural effusion. Scoliotic curvature in the spine. Cervicothoracic and lumbar fusion hardware, partially included. On limited assessment, no acute osseous finding IMPRESSION: Moderate-large left pneumothorax, greater than 50%. No mediastinal shift. Critical Value/emergent results were discussed by telephone at the time of interpretation on 04/03/2022 at 4:58 pm with provider [facility] , who verbally acknowledged these results. He will refer the patient to the  emergency department. Electronically Signed   By: Keith Rake M.D.   On: 04/03/2022 17:04          No data to display          No results found for: "NITRICOXIDE"      Assessment & Plan:   Pneumothorax on left Given the timeline between her trigger point injections and onset of symptoms, I suspect that this was a postprocedural pneumothorax. She is a never smoker and has no history of lung disease. She is clinically improved today. CXR without recurrence and improved left lung opacities/aeration. We will obtain CT chest to ensure there is no underlying pulmonary etiology.   Patient Instructions  Chest x ray today   CT chest ordered - someone will contact you for scheduling  Do not lift anything that is heavier than 10 lb (4.5 kg) for the next week then you can resume normal activities  Avoid activities that take a lot of effort (are strenuous) for as long as told by your health care provider. Do not fly in an airplane or scuba dive for the next week  Follow up in 4 weeks to review CT chest results with Dr. Elsworth Soho or Joellen Jersey Azya Barbero,NP. If symptoms return/worsen, please contact office for sooner follow up or seek emergency care.     I spent 35 minutes of dedicated to the care of this patient on the date of this encounter to include pre-visit review of records, face-to-face time with the patient discussing conditions above, post visit ordering of testing, clinical documentation with the electronic health record, making appropriate referrals as documented, and communicating necessary findings to members of the patients care team.  Clayton Bibles, NP 04/14/2022  Pt aware and understands NP's role.

## 2022-04-15 ENCOUNTER — Telehealth: Payer: Self-pay | Admitting: Nurse Practitioner

## 2022-04-16 ENCOUNTER — Ambulatory Visit (HOSPITAL_COMMUNITY): Payer: Medicare Other

## 2022-04-22 NOTE — Telephone Encounter (Signed)
Will close encounter

## 2022-04-27 ENCOUNTER — Ambulatory Visit (HOSPITAL_COMMUNITY)
Admission: RE | Admit: 2022-04-27 | Discharge: 2022-04-27 | Disposition: A | Discharge status: Home / Self Care | Payer: Medicare Other | Source: Ambulatory Visit | Attending: Nurse Practitioner | Admitting: Nurse Practitioner

## 2022-04-27 ENCOUNTER — Telehealth: Payer: Self-pay

## 2022-04-27 DIAGNOSIS — I700 Atherosclerosis of aorta: Secondary | ICD-10-CM | POA: Diagnosis not present

## 2022-04-27 DIAGNOSIS — J939 Pneumothorax, unspecified: Secondary | ICD-10-CM | POA: Insufficient documentation

## 2022-04-27 NOTE — Telephone Encounter (Signed)
FYI: Pt called and wanted you to know that she is having a CT scan done today to recheck her collapsed lung and she would like you to take a look at it also, if you don't mind. Thank you.

## 2022-05-01 NOTE — Progress Notes (Signed)
Please notify patient that her CT scan did not show any active lung disease or cause for her collapsed lung. Thanks.

## 2022-05-06 ENCOUNTER — Other Ambulatory Visit: Payer: Self-pay | Admitting: Family Medicine

## 2022-05-12 ENCOUNTER — Encounter: Payer: Self-pay | Admitting: Nurse Practitioner

## 2022-05-12 ENCOUNTER — Ambulatory Visit (INDEPENDENT_AMBULATORY_CARE_PROVIDER_SITE_OTHER): Payer: Medicare Other | Admitting: Nurse Practitioner

## 2022-05-12 VITALS — BP 118/70 | HR 72 | Ht 63.0 in | Wt 107.6 lb

## 2022-05-12 DIAGNOSIS — J9383 Other pneumothorax: Secondary | ICD-10-CM

## 2022-05-12 NOTE — Progress Notes (Signed)
$'@Patient'J$  ID: Katelyn [name], female    DOB: Jan 27, 1946, 76 y.o.   MRN: [id number]  Chief Complaint  Patient presents with   Follow-up    Pt f/u she is feeling well and breathing better.     Referring provider: Susy Frizzle, MD  HPI: 76 year old female, never smoker followed for pneumothorax. Past medical history significant for HTN, migraines, cervical dystonia, HLD, GAD.  She was recently hospitalized from 04/03/2022-04/05/2022 for left pneumothorax, treated with chest tube placement. She had gone on a cruise about a month prior, developed nonproductive cough and sinus pain. URI symptoms had resolved but she was still having a cough. On 10/10, she noticed that she had some increased back pain, which she thought was related to her chronic back pain. She was seen by her PCP on 10/13, who performed a CXR which revealed a left sided pneumothorax. She was referred to the ED, in [place], where repeat CXR showed a large left ptx. She was treated with CT placement. Lung expanded and CT was removed on 04/05/2022. She was discharged home.   TEST/EVENTS:  04/03/2022 CXR: moderate to large left pneumothorax, greater than 50%. Possible left pleural effusion.  04/05/2022 CXR: left sided chest tube has been removed; no residual ptx. Persistent opacities in the left lung with trace left pleural effusion.  04/27/2022 CT chest wo contrast: mild scarring in lung bases. No underlying pulmonary disease or predisposing cause. Atherosclerosis.   04/14/2022: OV with Arval Brandstetter NP for hospital follow up with her husband. She has been doing well since she was discharged home. No respiratory symptoms; cough has resolved. Back pain is at her baseline. They are preparing to go on a road trip up to [place] later this week; no plans of flying.  She has no known lung history and is a never smoker. She tells me she had some URI symptoms about 4 weeks before she was diagnosed with the pneumothorax. She had been starting to feel  better with a mild residual cough. She then went to have trigger point injections around 10/6. She tells me that she has gotten these for many years but a different provider from her normal performed the procedure this day. After this, her husband noticed that her cough started to get worse and then around 10/10 she developed worsening back pain which is what prompted them to go see her PCP on 10/13.  Suspected to be related to trigger point injections. CXR improved with fully expanded lung. Plan for CT chest to rule out underlying pulmonary disease.  05/12/2022: Today - follow up Patient presents today with her husband for follow-up.  She had CT chest since she was here last.  There was no evidence of underlying pulmonary disease or explanation for recent pneumothorax.  It is still felt that her spontaneous PTX was postprocedural related to trigger point injections.  Since she was here last, she has been feeling well.  No concerns or complaints.  She does not have any respiratory problems at baseline.  Denies any shortness of breath, cough or wheezing.  No pleuritic pain.  Overall doing quite well.  Has plans with family for Thanksgiving.  She is curious about what she should be doing about her trigger point injections.  She is going to discuss this with her spine doctor.  Allergies  Allergen Reactions   Iodine Hives   Prednisone Hives    Pt took Benadryl with Prednisone for surgery and did ok with it   Sulfa Antibiotics  Swelling   Red Dye Rash    Immunization History  Administered Date(s) Administered   Influenza, High Dose Seasonal PF 02/02/2019   PFIZER Comirnaty(Gray Top)Covid-19 Tri-Sucrose Vaccine 08/14/2019, 09/04/2019, 03/19/2020   PFIZER(Purple Top)SARS-COV-2 Vaccination 08/14/2019, 09/04/2019, 03/19/2020   PNEUMOCOCCAL CONJUGATE-20 07/29/2020   Pneumococcal Conjugate-13 10/17/2015   Pneumococcal Polysaccharide-23 09/24/2014   Unspecified SARS-COV-2 Vaccination 08/14/2019   Zoster  Recombinat (Shingrix) 07/29/2020   Zoster, Live 06/21/2013    Past Medical History:  Diagnosis Date   Anxiety    Arthritis    cervical and lumbar DDD   Heart murmur    Hyperlipidemia    Hypertension    Osteoporosis     Tobacco History: Social History   Tobacco Use  Smoking Status Never  Smokeless Tobacco Never   Counseling given: Not Answered   Outpatient Medications Prior to Visit  Medication Sig Dispense Refill   alendronate (FOSAMAX) 70 MG tablet TAKE 1 TABLET BY MOUTH EVERY 7 DAYS WITH A FULL GLASS OF WATER ON EMPTY STOMACH (Patient taking differently: 70 mg See admin instructions. Take one tablet by mouth (70 mg) every 7 days with a full glass of water on empty stomach.) 12 tablet 3   ALPRAZolam (XANAX) 0.5 MG tablet Take 1 tablet (0.5 mg total) by mouth 3 (three) times daily as needed for anxiety. TAKE 1 TABLET BY MOUTH TWICE A DAY AS NEEDED FOR ANXIETY (Patient taking differently: Take 0.5 mg by mouth 2 (two) times daily as needed for anxiety.) 90 tablet 0   Ascorbic Acid (VITAMIN C) 500 MG tablet Take 500 mg by mouth daily.       aspirin 81 MG tablet Take 81 mg by mouth daily.       B Complex Vitamins (VITAMIN B COMPLEX PO) Take 1 tablet by mouth daily.       calcium carbonate (OS-CAL) 600 MG TABS Take 600 mg by mouth 2 (two) times daily with a meal.       diclofenac sodium (VOLTAREN) 1 % GEL Apply 2 g topically 4 (four) times daily.     DULoxetine (CYMBALTA) 30 MG capsule Take 1 capsule (30 mg total) by mouth 2 (two) times daily. 180 capsule 3   fish oil-omega-3 fatty acids 1000 MG capsule Take 1 g by mouth 2 (two) times daily.       Garlic 7829 MG CAPS Take 1 capsule by mouth daily.       hydrochlorothiazide (HYDRODIURIL) 25 MG tablet TAKE 1 TABLET (25 MG TOTAL) BY MOUTH DAILY. 90 tablet 0   HYDROcodone-acetaminophen (NORCO/VICODIN) 5-325 MG tablet      Lysine 500 MG CAPS Take 1 capsule by mouth daily.       magnesium oxide (MAG-OX) 400 MG tablet Take 400 mg by mouth  daily.       Multiple Vitamins-Minerals (MULTIVITAMIN WITH MINERALS) tablet Take 1 tablet by mouth daily.       nortriptyline (PAMELOR) 10 MG capsule Take 10 mg by mouth See admin instructions. Take two capsules by mouth (20 mg) every morning and three capsules by mouth (30 mg) every evening.     POTASSIUM GLUCONATE PO Take 1 tablet by mouth daily.       PRESCRIPTION MEDICATION Trigger point injections.  Once every 3 months.     simvastatin (ZOCOR) 20 MG tablet TAKE 1 TABLET BY MOUTH EVERYDAY AT BEDTIME (Patient taking differently: Take 20 mg by mouth at bedtime.) 90 tablet 2   SUMAtriptan (IMITREX) 100 MG tablet MAY REPEAT IN 2  HOURS IF HEADACHE PERSISTS OR RECURS. (Patient taking differently: Take 100 mg by mouth See admin instructions. Take one tablet by mouth (100 mg).  May repeat in 2 hours if headache persists or recurs.) 12 tablet 9   traMADol (ULTRAM) 50 MG tablet Take 50 mg by mouth every 8 (eight) hours as needed. for pain  0   vitamin E 400 UNIT capsule Take 400 Units by mouth daily.       zolpidem (AMBIEN) 5 MG tablet TAKE 1 TABLET BY MOUTH AT BEDTIME AS NEEDED FOR SLEEP (Patient taking differently: Take 5 mg by mouth at bedtime as needed for sleep.) 30 tablet 3   No facility-administered medications prior to visit.     Review of Systems:   Constitutional: No weight loss or gain, night sweats, fevers, chills, fatigue, or lassitude. HEENT: No headaches, sneezing, itching, ear ache, nasal congestion, or post nasal drip CV:  No chest pain, orthopnea, PND, swelling in lower extremities, anasarca, dizziness, palpitations, syncope Resp: No shortness of breath with exertion or at rest. No excess mucus or change in color of mucus. No productive or non-productive. No hemoptysis. No wheezing.  No chest wall deformity GI:  No heartburn, indigestion, abdominal pain, nausea, vomiting, diarrhea, change in bowel habits, loss of appetite, bloody stools.  MSK:  No joint pain or swelling.  No  decreased range of motion.  No increased back pain. Neuro: No dizziness or lightheadedness.  Psych: No depression or anxiety. Mood stable.     Physical Exam:  BP 118/70   Pulse 72   Ht '5\' 3"'$  (1.6 m)   Wt 107 lb 9.6 oz (48.8 kg)   SpO2 100%   BMI 19.06 kg/m   GEN: Pleasant, interactive, well-appearing; in no acute distress. HEENT:  Normocephalic and atraumatic. PERRLA. Sclera white. Nasal turbinates pink, moist and patent bilaterally. No rhinorrhea present. Oropharynx pink and moist, without exudate or edema. No lesions, ulcerations, or postnasal drip.  NECK:  Supple w/ fair ROM. No JVD present. Normal carotid impulses w/o bruits. Thyroid symmetrical with no goiter or nodules palpated. No lymphadenopathy.   CV: RRR, no m/r/g, no peripheral edema. Pulses intact, +2 bilaterally. No cyanosis, pallor or clubbing. PULMONARY:  Unlabored, regular breathing. Clear bilaterally A&P w/o wheezes/rales/rhonchi. No accessory muscle use.  GI: BS present and normoactive. Soft, non-tender to palpation. No organomegaly or masses detected.  MSK: No erythema, warmth or tenderness. No deformities or joint swelling noted.  Neuro: A/Ox3. No focal deficits noted.   Skin: Warm, no lesions or rashe Psych: Normal affect and behavior. Judgement and thought content appropriate.     Lab Results:  CBC    Component Value Date/Time   WBC 8.3 04/03/2022 1819   RBC 4.04 04/03/2022 1819   HGB 12.1 04/03/2022 1819   HCT 36.5 04/03/2022 1819   PLT 344 04/03/2022 1819   MCV 90.3 04/03/2022 1819   MCH 30.0 04/03/2022 1819   MCHC 33.2 04/03/2022 1819   RDW 14.6 04/03/2022 1819   LYMPHSABS 1,931 11/10/2021 1522   MONOABS 627 10/27/2016 0901   EOSABS 88 11/10/2021 1522   BASOSABS 27 11/10/2021 1522    BMET    Component Value Date/Time   NA 135 04/03/2022 1819   K 3.8 04/03/2022 1819   CL 100 04/03/2022 1819   CO2 25 04/03/2022 1819   GLUCOSE 84 04/03/2022 1819   BUN 8 04/03/2022 1819   CREATININE 0.72  04/03/2022 1819   CREATININE 0.63 11/10/2021 1522   CALCIUM 9.8 04/03/2022  1819   GFRNONAA >60 04/03/2022 1819   GFRNONAA 81 02/12/2020 0901   GFRAA 94 02/12/2020 0901    BNP No results found for: "BNP"   Imaging:  CT Chest Wo Contrast  Result Date: 04/30/2022 CLINICAL DATA:  Pneumothorax EXAM: CT CHEST WITHOUT CONTRAST TECHNIQUE: Multidetector CT imaging of the chest was performed following the standard protocol without IV contrast. RADIATION DOSE REDUCTION: This exam was performed according to the departmental dose-optimization program which includes automated exposure control, adjustment of the mA and/or kV according to patient size and/or use of iterative reconstruction technique. COMPARISON:  04/14/2022 FINDINGS: Cardiovascular: No significant vascular findings. Normal heart size. No pericardial effusion. Atheromatous calcification of the aorta that is multifocal. Mediastinum/Nodes: Negative for mass or adenopathy Lungs/Pleura: No residual pneumothorax or pleural fluid. No underlying bleb or cavity seen. Unremarkable airways. Mild atelectasis or scarring at the lung bases, no fibrosis or generalized interstitial opacity. Upper Abdomen: No contributory finding. Musculoskeletal: Cervicothoracic and lumbar fusion hardware. There is scoliosis and thoracic disc degeneration greatest in the midthoracic spine. IMPRESSION: 1. Resolved pneumothorax without residual abnormality. No underlying emphysema or cavity as a predisposing cause. 2. Mild scarring or atelectasis at the lung bases. 3.  Aortic Atherosclerosis (ICD10-I70.0). Electronically Signed   By: Jorje Guild M.D.   On: 04/30/2022 07:31   DG Chest 2 View  Result Date: 04/14/2022 CLINICAL DATA:  Follow-up pneumothorax. EXAM: CHEST - 2 VIEW COMPARISON:  04/05/2022 and older studies. FINDINGS: No pneumothorax. Left lung opacities noted on the prior exam have resolved. Minor linear opacity in the left upper lobe lingula consistent with  atelectasis or scarring. Lungs otherwise clear. No pleural effusion. Cardiac silhouette normal in size. No mediastinal or hilar masses or evidence of adenopathy. IMPRESSION: 1. No acute cardiopulmonary disease. 2. Left lung airspace opacities have resolved. 3. No pneumothorax. Electronically Signed   By: Lajean Manes M.D.   On: 04/14/2022 10:23          No data to display          No results found for: "NITRICOXIDE"      Assessment & Plan:   Spontaneous pneumothorax She had trigger point injection on 10/6. She developed a cough afterwards with worsening on 10/10. She went to her PCP on 10/13 with CXR with large ptx. Given the timeline between her trigger point injections and onset of symptoms, I suspect that this was a postprocedural pneumothorax. She is a never smoker and has no history of lung disease. Her CT chest did not have any significant findings.  Discussed the risk of her undergoing trigger point injections again and ptx. She will discuss this with her spine doctor.  Patient Instructions  Discuss trigger point injections with your spine doctor. There is always a risk for recurrent collapsed lung with future injections.  CT chest did not show any underlying lung disease  You did have some plaque buildup in your main artery so I recommend you follow up with your primary care doctor to discuss monitoring/further evaluation.  Follow up with pulmonary as needed    I spent 28 minutes of dedicated to the care of this patient on the date of this encounter to include pre-visit review of records, face-to-face time with the patient discussing conditions above, post visit ordering of testing, clinical documentation with the electronic health record, making appropriate referrals as documented, and communicating necessary findings to members of the patients care team.  Clayton Bibles, NP 05/12/2022  Pt aware and understands NP's role.

## 2022-05-12 NOTE — Patient Instructions (Signed)
Discuss trigger point injections with your spine doctor. There is always a risk for recurrent collapsed lung with future injections.  CT chest did not show any underlying lung disease  You did have some plaque buildup in your main artery so I recommend you follow up with your primary care doctor to discuss monitoring/further evaluation.  Follow up with pulmonary as needed

## 2022-05-12 NOTE — Assessment & Plan Note (Signed)
She had trigger point injection on 10/6. She developed a cough afterwards with worsening on 10/10. She went to her PCP on 10/13 with CXR with large ptx. Given the timeline between her trigger point injections and onset of symptoms, I suspect that this was a postprocedural pneumothorax. She is a never smoker and has no history of lung disease. Her CT chest did not have any significant findings.  Discussed the risk of her undergoing trigger point injections again and ptx. She will discuss this with her spine doctor.  Patient Instructions  Discuss trigger point injections with your spine doctor. There is always a risk for recurrent collapsed lung with future injections.  CT chest did not show any underlying lung disease  You did have some plaque buildup in your main artery so I recommend you follow up with your primary care doctor to discuss monitoring/further evaluation.  Follow up with pulmonary as needed

## 2022-05-20 DIAGNOSIS — M4322 Fusion of spine, cervical region: Secondary | ICD-10-CM | POA: Diagnosis not present

## 2022-05-20 DIAGNOSIS — M4326 Fusion of spine, lumbar region: Secondary | ICD-10-CM | POA: Diagnosis not present

## 2022-05-20 DIAGNOSIS — Z681 Body mass index (BMI) 19 or less, adult: Secondary | ICD-10-CM | POA: Diagnosis not present

## 2022-06-25 DIAGNOSIS — M4322 Fusion of spine, cervical region: Secondary | ICD-10-CM | POA: Diagnosis not present

## 2022-06-25 DIAGNOSIS — G894 Chronic pain syndrome: Secondary | ICD-10-CM | POA: Diagnosis not present

## 2022-06-25 DIAGNOSIS — Z681 Body mass index (BMI) 19 or less, adult: Secondary | ICD-10-CM | POA: Diagnosis not present

## 2022-06-25 DIAGNOSIS — M4326 Fusion of spine, lumbar region: Secondary | ICD-10-CM | POA: Diagnosis not present

## 2022-06-25 DIAGNOSIS — Z79891 Long term (current) use of opiate analgesic: Secondary | ICD-10-CM | POA: Diagnosis not present

## 2022-06-25 DIAGNOSIS — M791 Myalgia, unspecified site: Secondary | ICD-10-CM | POA: Diagnosis not present

## 2022-06-25 DIAGNOSIS — Z79899 Other long term (current) drug therapy: Secondary | ICD-10-CM | POA: Diagnosis not present

## 2022-07-28 ENCOUNTER — Other Ambulatory Visit: Payer: Self-pay | Admitting: Family Medicine

## 2022-07-29 ENCOUNTER — Telehealth: Payer: Self-pay

## 2022-07-29 NOTE — Telephone Encounter (Signed)
Pt called stated why her Xanax has not been refill? Explain to pt that base on looking at her chart it looks to be it that she needed a CPE. However, per pt stated that she has a CPE back on 02/16/22 (code: OV)?  Please advice

## 2022-07-29 NOTE — Telephone Encounter (Signed)
Requested medication (s) are due for refill today: yes  Requested medication (s) are on the active medication list: yes  Last refill:  03/29/22  Future visit scheduled: no  Notes to clinic:  Unable to refill per protocol, cannot delegate.      Requested Prescriptions  Pending Prescriptions Disp Refills   ALPRAZolam (XANAX) 0.5 MG tablet [Pharmacy Med Name: ALPRAZOLAM 0.5 MG TABLET] 90 tablet 0    Sig: TAKE 1 TABLET BY MOUTH THREE TIMES A DAY AS NEEDED FOR ANXIETY     Not Delegated - Psychiatry: Anxiolytics/Hypnotics 2 Failed - 07/28/2022  9:13 AM      Failed - This refill cannot be delegated      Failed - Urine Drug Screen completed in last 360 days      Failed - Valid encounter within last 6 months    Recent Outpatient Visits           8 months ago Fatigue, unspecified type   [place] Susy Frizzle, MD   1 year ago Routine general medical examination at a health care facility   [facility], Cammie Mcgee, MD   2 years ago Pure hypercholesterolemia   [place] [facility] Schaumann, Cammie Mcgee, MD   2 years ago UTI symptoms   [place], [place], FNP   3 years ago Osteoporosis without current pathological fracture, unspecified osteoporosis type   [place] Pickard, Cammie Mcgee, MD              Passed - Patient is not pregnant      Signed Prescriptions Disp Refills   hydrochlorothiazide (HYDRODIURIL) 25 MG tablet 90 tablet 0    Sig: TAKE 1 TABLET (25 MG TOTAL) BY MOUTH DAILY.     Cardiovascular: Diuretics - Thiazide Failed - 07/28/2022  9:13 AM      Failed - Valid encounter within last 6 months    Recent Outpatient Visits           8 months ago Fatigue, unspecified type   [place] Pickard, Cammie Mcgee, MD   1 year ago Routine general medical examination at a health care facility   [place], Cammie Mcgee, MD   2 years ago Pure  hypercholesterolemia   [place] Dennard Schaumann, Cammie Mcgee, MD   2 years ago UTI symptoms   [place], [place], FNP   3 years ago Osteoporosis without current pathological fracture, unspecified osteoporosis type   [place] Pickard, Cammie Mcgee, MD              Passed - Cr in normal range and within 180 days    Creat  Date Value Ref Range Status  11/10/2021 0.63 0.60 - 1.00 mg/dL Final   Creatinine, Ser  Date Value Ref Range Status  04/03/2022 0.72 0.44 - 1.00 mg/dL Final         Passed - K in normal range and within 180 days    Potassium  Date Value Ref Range Status  04/03/2022 3.8 3.5 - 5.1 mmol/L Final         Passed - Na in normal range and within 180 days    Sodium  Date Value Ref Range Status  04/03/2022 135 135 - 145 mmol/L Final         Passed - Last BP in normal range    BP Readings  from Last 1 Encounters:  05/12/22 118/70          simvastatin (ZOCOR) 20 MG tablet 90 tablet 0    Sig: Take 1 tablet (20 mg total) by mouth at bedtime.     Cardiovascular:  Antilipid - Statins Failed - 07/28/2022  9:13 AM      Failed - Lipid Panel in normal range within the last 12 months    Cholesterol  Date Value Ref Range Status  02/16/2022 243 (H) <200 mg/dL Final   LDL Cholesterol (Calc)  Date Value Ref Range Status  02/16/2022 108 (H) mg/dL (calc) Final    Comment:    Reference range: <100 . Desirable range <100 mg/dL for primary prevention;   <70 mg/dL for patients with CHD or diabetic patients  with > or = 2 CHD risk factors. Marland Kitchen LDL-C is now calculated using the Martin-Hopkins  calculation, which is a validated novel method providing  better accuracy than the Friedewald equation in the  estimation of LDL-C.  Cresenciano Genre et al. Annamaria Helling. 2800;349(17): 2061-2068  (http://education.[facility].com/faq/FAQ164)    HDL  Date Value Ref Range Status  02/16/2022 119 > OR = 50 mg/dL Final   Triglycerides  Date  Value Ref Range Status  02/16/2022 75 <150 mg/dL Final         Passed - Patient is not pregnant      Passed - Valid encounter within last 12 months    Recent Outpatient Visits           8 months ago Fatigue, unspecified type   [place] Pickard, Cammie Mcgee, MD   1 year ago Routine general medical examination at a health care facility   [place], Cammie Mcgee, MD   2 years ago Pure hypercholesterolemia   [place] Dennard Schaumann, Cammie Mcgee, MD   2 years ago UTI symptoms   [place], [place], [place]   3 years ago Osteoporosis without current pathological fracture, unspecified osteoporosis type   [place] Pickard, Cammie Mcgee, MD

## 2022-07-29 NOTE — Telephone Encounter (Signed)
Requested Prescriptions  Pending Prescriptions Disp Refills   ALPRAZolam (XANAX) 0.5 MG tablet [Pharmacy Med Name: ALPRAZOLAM 0.5 MG TABLET] 90 tablet 0    Sig: TAKE 1 TABLET BY MOUTH THREE TIMES A DAY AS NEEDED FOR ANXIETY     Not Delegated - Psychiatry: Anxiolytics/Hypnotics 2 Failed - 07/28/2022  9:13 AM      Failed - This refill cannot be delegated      Failed - Urine Drug Screen completed in last 360 days      Failed - Valid encounter within last 6 months    Recent Outpatient Visits           8 months ago Fatigue, unspecified type   [place] Susy Frizzle, MD   1 year ago Routine general medical examination at a health care facility   [place], Cammie Mcgee, MD   2 years ago Pure hypercholesterolemia   [place] Denn[facility] Schaumann, Cammie Mcgee, MD   2 years ago UTI symptoms   [place], [place], FNP   3 years ago Osteoporosis without current pathological fracture, unspecified osteoporosis type   [place], Cammie Mcgee, MD              Passed - Patient is not pregnant       hydrochlorothiazide (HYDRODIURIL) 25 MG tablet [Pharmacy Med Name: HYDROCHLOROTHIAZIDE 25 MG TAB] 90 tablet 0    Sig: TAKE 1 TABLET (25 MG TOTAL) BY MOUTH DAILY.     Cardiovascular: Diuretics - Thiazide Failed - 07/28/2022  9:13 AM      Failed - Valid encounter within last 6 months    Recent Outpatient Visits           8 months ago Fatigue, unspecified type   [place] Pickard, Cammie Mcgee, MD   1 year ago Routine general medical examination at a health care facility   [place], Cammie Mcgee, MD   2 years ago Pure hypercholesterolemia   [place] Dennard Schaumann, Cammie Mcgee, MD   2 years ago UTI symptoms   [place], [place], FNP   3 years ago Osteoporosis without current pathological fracture, unspecified osteoporosis  type   [place] Pickard, Cammie Mcgee, MD              Passed - Cr in normal range and within 180 days    Creat  Date Value Ref Range Status  11/10/2021 0.63 0.60 - 1.00 mg/dL Final   Creatinine, Ser  Date Value Ref Range Status  04/03/2022 0.72 0.44 - 1.00 mg/dL Final         Passed - K in normal range and within 180 days    Potassium  Date Value Ref Range Status  04/03/2022 3.8 3.5 - 5.1 mmol/L Final         Passed - Na in normal range and within 180 days    Sodium  Date Value Ref Range Status  04/03/2022 135 135 - 145 mmol/L Final         Passed - Last BP in normal range    BP Readings from Last 1 Encounters:  05/12/22 118/70          simvastatin (ZOCOR) 20 MG tablet [Pharmacy Med Name: SIMVASTATIN 20 MG TABLET] 90 tablet 0    Sig: Take 1 tablet (20 mg total) by mouth at bedtime.  Cardiovascular:  Antilipid - Statins Failed - 07/28/2022  9:13 AM      Failed - Lipid Panel in normal range within the last 12 months    Cholesterol  Date Value Ref Range Status  02/16/2022 243 (H) <200 mg/dL Final   LDL Cholesterol (Calc)  Date Value Ref Range Status  02/16/2022 108 (H) mg/dL (calc) Final    Comment:    Reference range: <100 . Desirable range <100 mg/dL for primary prevention;   <70 mg/dL for patients with CHD or diabetic patients  with > or = 2 CHD risk factors. Marland Kitchen LDL-C is now calculated using the Martin-Hopkins  calculation, which is a validated novel method providing  better accuracy than the Friedewald equation in the  estimation of LDL-C.  Cresenciano Genre et al. Annamaria Helling. 0092;330(07): 2061-2068  (http://education.[facility].com/faq/FAQ164)    HDL  Date Value Ref Range Status  02/16/2022 119 > OR = 50 mg/dL Final   Triglycerides  Date Value Ref Range Status  02/16/2022 75 <150 mg/dL Final         Passed - Patient is not pregnant      Passed - Valid encounter within last 12 months    Recent Outpatient Visits           8  months ago Fatigue, unspecified type   [place] Pickard, Cammie Mcgee, MD   1 year ago Routine general medical examination at a health care facility   [place], Cammie Mcgee, MD   2 years ago Pure hypercholesterolemia   [place] Dennard Schaumann, Cammie Mcgee, MD   2 years ago UTI symptoms   [place], [place], [place]   3 years ago Osteoporosis without current pathological fracture, unspecified osteoporosis type   [place] Pickard, Cammie Mcgee, MD

## 2022-08-24 ENCOUNTER — Other Ambulatory Visit: Payer: Self-pay | Admitting: Family Medicine

## 2022-08-24 DIAGNOSIS — Z1231 Encounter for screening mammogram for malignant neoplasm of breast: Secondary | ICD-10-CM

## 2022-08-27 DIAGNOSIS — M4322 Fusion of spine, cervical region: Secondary | ICD-10-CM | POA: Diagnosis not present

## 2022-08-27 DIAGNOSIS — M542 Cervicalgia: Secondary | ICD-10-CM | POA: Diagnosis not present

## 2022-08-27 DIAGNOSIS — I10 Essential (primary) hypertension: Secondary | ICD-10-CM | POA: Diagnosis not present

## 2022-08-27 DIAGNOSIS — M4326 Fusion of spine, lumbar region: Secondary | ICD-10-CM | POA: Diagnosis not present

## 2022-08-27 DIAGNOSIS — M5416 Radiculopathy, lumbar region: Secondary | ICD-10-CM | POA: Diagnosis not present

## 2022-09-02 ENCOUNTER — Other Ambulatory Visit: Payer: Self-pay | Admitting: Orthopaedic Surgery

## 2022-09-02 DIAGNOSIS — M48062 Spinal stenosis, lumbar region with neurogenic claudication: Secondary | ICD-10-CM

## 2022-09-02 DIAGNOSIS — M5416 Radiculopathy, lumbar region: Secondary | ICD-10-CM

## 2022-09-03 ENCOUNTER — Telehealth: Payer: Self-pay

## 2022-09-03 NOTE — Telephone Encounter (Signed)
Unfortunately I do not provide regular nerve blocks or botox for cervical dystonia. [facility] does [facility], I would recommend sending there thanks

## 2022-09-03 NOTE — Telephone Encounter (Signed)
Reviewed, previous botulism toxin did not help her neck pain much, would suggest her pain management,

## 2022-09-03 NOTE — Telephone Encounter (Signed)
We received a new referral for this patient for migraines / neck pain. She has been seen in the past by Dr Jaynee Eagles, who referred her to Dr Krista Blue for EMG guided botox injections for neck pain. She is wanting to transfer her care from Dr Krista Blue back to Dr Jaynee Eagles for this new referral. She states she is interested in botox or nerve blocks.   Please advise.

## 2022-09-15 ENCOUNTER — Ambulatory Visit
Admission: RE | Admit: 2022-09-15 | Discharge: 2022-09-15 | Disposition: A | Discharge status: Home / Self Care | Payer: Medicare Other | Source: Ambulatory Visit | Attending: Orthopaedic Surgery | Admitting: Orthopaedic Surgery

## 2022-09-15 DIAGNOSIS — M5416 Radiculopathy, lumbar region: Secondary | ICD-10-CM

## 2022-09-15 DIAGNOSIS — R200 Anesthesia of skin: Secondary | ICD-10-CM | POA: Diagnosis not present

## 2022-09-15 DIAGNOSIS — M48062 Spinal stenosis, lumbar region with neurogenic claudication: Secondary | ICD-10-CM

## 2022-09-15 DIAGNOSIS — M48061 Spinal stenosis, lumbar region without neurogenic claudication: Secondary | ICD-10-CM | POA: Diagnosis not present

## 2022-09-15 DIAGNOSIS — M5127 Other intervertebral disc displacement, lumbosacral region: Secondary | ICD-10-CM | POA: Diagnosis not present

## 2022-09-15 DIAGNOSIS — M79604 Pain in right leg: Secondary | ICD-10-CM | POA: Diagnosis not present

## 2022-09-17 DIAGNOSIS — L578 Other skin changes due to chronic exposure to nonionizing radiation: Secondary | ICD-10-CM | POA: Diagnosis not present

## 2022-09-17 DIAGNOSIS — M4322 Fusion of spine, cervical region: Secondary | ICD-10-CM | POA: Diagnosis not present

## 2022-09-17 DIAGNOSIS — M5416 Radiculopathy, lumbar region: Secondary | ICD-10-CM | POA: Diagnosis not present

## 2022-09-17 DIAGNOSIS — L814 Other melanin hyperpigmentation: Secondary | ICD-10-CM | POA: Diagnosis not present

## 2022-09-17 DIAGNOSIS — M4326 Fusion of spine, lumbar region: Secondary | ICD-10-CM | POA: Diagnosis not present

## 2022-09-17 DIAGNOSIS — D2271 Melanocytic nevi of right lower limb, including hip: Secondary | ICD-10-CM | POA: Diagnosis not present

## 2022-09-17 DIAGNOSIS — D225 Melanocytic nevi of trunk: Secondary | ICD-10-CM | POA: Diagnosis not present

## 2022-09-17 DIAGNOSIS — M791 Myalgia, unspecified site: Secondary | ICD-10-CM | POA: Diagnosis not present

## 2022-09-21 ENCOUNTER — Other Ambulatory Visit: Payer: Self-pay | Admitting: Family Medicine

## 2022-09-21 NOTE — Telephone Encounter (Signed)
Requested medications are due for refill today.  unsure  Requested medications are on the active medications list.  yes  Last refill. 07/30/2022 #90 0 rf  Future visit scheduled.   yes  Notes to clinic.  Refill not delegated.    Requested Prescriptions  Pending Prescriptions Disp Refills   ALPRAZolam (XANAX) 0.5 MG tablet [Pharmacy Med Name: ALPRAZOLAM 0.5 MG TABLET] 90 tablet 0    Sig: TAKE 1 TABLET BY MOUTH THREE TIMES A DAY AS NEEDED FOR ANXIETY     Not Delegated - Psychiatry: Anxiolytics/Hypnotics 2 Failed - 09/21/2022  9:23 AM      Failed - This refill cannot be delegated      Failed - Urine Drug Screen completed in last 360 days      Failed - Valid encounter within last 6 months    Recent Outpatient Visits           10 months ago Fatigue, unspecified type   [place] Susy Frizzle, MD   1 year ago Routine general medical examination at a health care facility   [place], Cammie Mcgee, MD   2 years ago Pure hypercholesterolemia   [place] Dennard Schaumann, Cammie Mcgee, MD   2 years ago UTI symptoms   [place], [place]ie, [place]   3 years ago Osteoporosis without current pathological fracture, unspecified osteoporosis type   [place] Pickard, Cammie Mcgee, MD       Future Appointments             In 5 months Pickard, Cammie Mcgee, MD [place], [place] - Patient is not pregnant

## 2022-09-24 ENCOUNTER — Other Ambulatory Visit: Payer: Medicare Other

## 2022-10-01 ENCOUNTER — Ambulatory Visit (INDEPENDENT_AMBULATORY_CARE_PROVIDER_SITE_OTHER): Payer: Medicare Other

## 2022-10-01 ENCOUNTER — Telehealth: Payer: Self-pay

## 2022-10-01 DIAGNOSIS — R319 Hematuria, unspecified: Secondary | ICD-10-CM

## 2022-10-01 LAB — URINALYSIS, ROUTINE W REFLEX MICROSCOPIC
Bilirubin Urine: NEGATIVE
Glucose, UA: NEGATIVE
Hyaline Cast: NONE SEEN /LPF
Ketones, ur: NEGATIVE
Nitrite: NEGATIVE
Protein, ur: NEGATIVE
Specific Gravity, Urine: 1.015 (ref 1.001–1.035)
pH: 7 (ref 5.0–8.0)

## 2022-10-01 LAB — MICROSCOPIC MESSAGE

## 2022-10-01 NOTE — Telephone Encounter (Signed)
Pt called wanting to come in and leave a urine sample. Pt thinks she may have a possible bladder infection. Pt informed she would need to be seen. No appts available today for either provider. Please advise.  Cb#: [phone]

## 2022-10-02 ENCOUNTER — Ambulatory Visit: Payer: BLUE CROSS/BLUE SHIELD | Admitting: Family Medicine

## 2022-10-02 ENCOUNTER — Other Ambulatory Visit: Payer: Self-pay | Admitting: Family Medicine

## 2022-10-02 MED ORDER — CEPHALEXIN 500 MG PO CAPS: 500.0000 mg | ORAL_CAPSULE | Freq: Three times a day (TID) | ORAL | 0 refills | Status: DC

## 2022-10-03 LAB — URINE CULTURE
MICRO NUMBER:: 14812557
SPECIMEN QUALITY:: ADEQUATE

## 2022-10-08 ENCOUNTER — Other Ambulatory Visit: Payer: Self-pay | Admitting: Family Medicine

## 2022-10-08 ENCOUNTER — Telehealth: Payer: Self-pay

## 2022-10-08 MED ORDER — CIPROFLOXACIN HCL 500 MG PO TABS: 500.0000 mg | ORAL_TABLET | Freq: Two times a day (BID) | ORAL | 0 refills | Status: AC

## 2022-10-08 NOTE — Telephone Encounter (Signed)
Pt called in stating that she came in for a bladder infection recently and was prescribed an antibiotic. Pt states that she is still having some issues with this and does not think that the med is working that great for her. Pt would like a cb from the nurse as to what she may need to do please. Please advise.  Cb#: [phone]

## 2022-10-14 ENCOUNTER — Ambulatory Visit
Admission: RE | Admit: 2022-10-14 | Discharge: 2022-10-14 | Disposition: A | Discharge status: Home / Self Care | Payer: BLUE CROSS/BLUE SHIELD | Source: Ambulatory Visit | Attending: Family Medicine | Admitting: Family Medicine

## 2022-10-14 DIAGNOSIS — Z1231 Encounter for screening mammogram for malignant neoplasm of breast: Secondary | ICD-10-CM | POA: Diagnosis not present

## 2022-10-16 ENCOUNTER — Ambulatory Visit (INDEPENDENT_AMBULATORY_CARE_PROVIDER_SITE_OTHER): Payer: Medicare Other | Admitting: Family Medicine

## 2022-10-16 VITALS — BP 118/64 | HR 77 | Temp 97.8°F | Ht 63.0 in | Wt 109.4 lb

## 2022-10-16 DIAGNOSIS — R300 Dysuria: Secondary | ICD-10-CM

## 2022-10-16 LAB — URINALYSIS, ROUTINE W REFLEX MICROSCOPIC
Bilirubin Urine: NEGATIVE
Glucose, UA: NEGATIVE
Hgb urine dipstick: NEGATIVE
Ketones, ur: NEGATIVE
Leukocytes,Ua: NEGATIVE
Nitrite: NEGATIVE
Protein, ur: NEGATIVE
Specific Gravity, Urine: 1.02 (ref 1.001–1.035)
pH: 7.5 (ref 5.0–8.0)

## 2022-10-16 MED ORDER — ESTRADIOL 0.1 MG/GM VA CREA: 1.0000 | TOPICAL_CREAM | Freq: Every day | VAGINAL | 12 refills | Status: DC

## 2022-10-16 NOTE — Progress Notes (Signed)
Subjective:    Patient ID: Katelyn [name], female    DOB: Jan 28, 1946, 77 y.o.   MRN: [id number]  HPI Was treated with keflex (4/12) and then cipro (4/18) for UTI.  Culture confirmed E.coli  (4/11) which was pansensitive and she continued to report symptoms so I asked her to come in.   Urinalysis today shows no blood, no nitrates, no leukocyte esterase.  Patient reports itching around the vulva.  She reports discomfort around the vulva.  She reports feeling like she has to urinate/hesitancy but no urination.  She denies any vaginal bleeding.  She also reports itching around the rectal area.  She denies any vaginal discharge.  She denies any rash. Past Medical History:  Diagnosis Date   Anxiety    Arthritis    cervical and lumbar DDD   Heart murmur    Hyperlipidemia    Hypertension    Osteoporosis    Past Surgical History:  Procedure Laterality Date   BUNIONECTOMY     right   CERVICAL FUSION     x3   COLONOSCOPY     POLYPECTOMY     radiofrequency ablasion     SPINE SURGERY      Dr. Noel Gerold (x2, last Nov/15) C3-T4   UPPER GASTROINTESTINAL ENDOSCOPY     Current Outpatient Medications on File Prior to Visit  Medication Sig Dispense Refill   cephALEXin (KEFLEX) 500 MG capsule Take 1 capsule (500 mg total) by mouth 3 (three) times daily. 21 capsule 0   alendronate (FOSAMAX) 70 MG tablet TAKE 1 TABLET BY MOUTH EVERY 7 DAYS WITH A FULL GLASS OF WATER ON EMPTY STOMACH (Patient taking differently: 70 mg See admin instructions. Take one tablet by mouth (70 mg) every 7 days with a full glass of water on empty stomach.) 12 tablet 3   ALPRAZolam (XANAX) 0.5 MG tablet TAKE 1 TABLET BY MOUTH THREE TIMES A DAY AS NEEDED FOR ANXIETY 90 tablet 0   Ascorbic Acid (VITAMIN C) 500 MG tablet Take 500 mg by mouth daily.       aspirin 81 MG tablet Take 81 mg by mouth daily.       B Complex Vitamins (VITAMIN B COMPLEX PO) Take 1 tablet by mouth daily.       calcium carbonate (OS-CAL) 600 MG TABS Take 600  mg by mouth 2 (two) times daily with a meal.       diclofenac sodium (VOLTAREN) 1 % GEL Apply 2 g topically 4 (four) times daily.     DULoxetine (CYMBALTA) 30 MG capsule Take 1 capsule (30 mg total) by mouth 2 (two) times daily. 180 capsule 3   fish oil-omega-3 fatty acids 1000 MG capsule Take 1 g by mouth 2 (two) times daily.       Garlic 1000 MG CAPS Take 1 capsule by mouth daily.       hydrochlorothiazide (HYDRODIURIL) 25 MG tablet TAKE 1 TABLET (25 MG TOTAL) BY MOUTH DAILY. 90 tablet 0   HYDROcodone-acetaminophen (NORCO/VICODIN) 5-325 MG tablet      Lysine 500 MG CAPS Take 1 capsule by mouth daily.       magnesium oxide (MAG-OX) 400 MG tablet Take 400 mg by mouth daily.       Multiple Vitamins-Minerals (MULTIVITAMIN WITH MINERALS) tablet Take 1 tablet by mouth daily.       nortriptyline (PAMELOR) 10 MG capsule Take 10 mg by mouth See admin instructions. Take two capsules by mouth (20 mg) every morning and three capsules  by mouth (30 mg) every evening.     POTASSIUM GLUCONATE PO Take 1 tablet by mouth daily.       PRESCRIPTION MEDICATION Trigger point injections.  Once every 3 months.     simvastatin (ZOCOR) 20 MG tablet Take 1 tablet (20 mg total) by mouth at bedtime. 90 tablet 0   SUMAtriptan (IMITREX) 100 MG tablet MAY REPEAT IN 2 HOURS IF HEADACHE PERSISTS OR RECURS. (Patient taking differently: Take 100 mg by mouth See admin instructions. Take one tablet by mouth (100 mg).  May repeat in 2 hours if headache persists or recurs.) 12 tablet 9   traMADol (ULTRAM) 50 MG tablet Take 50 mg by mouth every 8 (eight) hours as needed. for pain  0   vitamin E 400 UNIT capsule Take 400 Units by mouth daily.       zolpidem (AMBIEN) 5 MG tablet TAKE 1 TABLET BY MOUTH AT BEDTIME AS NEEDED FOR SLEEP (Patient taking differently: Take 5 mg by mouth at bedtime as needed for sleep.) 30 tablet 3   No current facility-administered medications on file prior to visit.   Allergies  Allergen Reactions   Iodine  Hives   Prednisone Hives    Pt took Benadryl with Prednisone for surgery and did ok with it   Sulfa Antibiotics Swelling   Red Dye Rash   Social History   Socioeconomic History   Marital status: Married    Spouse name: Jomarie Longs   Number of children: 2   Years of education: [employer] education level: Associate degree: academic program  Occupational History   Occupation: Retired  Tobacco Use   Smoking status: Never   Smokeless tobacco: Never  Vaping Use   Vaping Use: Never used  Substance and Sexual Activity   Alcohol use: Yes    Alcohol/week: 7.0 standard drinks of alcohol    Types: 7 Glasses of wine per week    Comment: 1-2 rare   Drug use: No   Sexual activity: Yes  Other Topics Concern   Not on file  Social History Narrative   Lives with husband   Caffeine use: none   Social Determinants of Health   Financial Resource Strain: Low Risk  (10/16/2022)   Overall Financial Resource Strain (CARDIA)    Difficulty of Paying Living Expenses: Not hard at all  Food Insecurity: No Food Insecurity (10/16/2022)   Hunger Vital Sign    Worried About Running Out of Food in the Last Year: Never true    Ran Out of Food in the Last Year: Never true  Transportation Needs: No Transportation Needs (10/16/2022)   PRAPARE - Administrator, Civil Service (Medical): No    Lack of Transportation (Non-Medical): No  Physical Activity: Insufficiently Active (10/16/2022)   Exercise Vital Sign    Days of Exercise per Week: 4 days    Minutes of Exercise per Session: 30 min  Stress: No Stress Concern Present (10/16/2022)   [employer] of Occupational Health - Occupational Stress Questionnaire    Feeling of Stress : Not at all  Social Connections: Moderately Integrated (10/16/2022)   Social Connection and Isolation Panel [NHANES]    Frequency of Communication with Friends and Family: More than three times a week    Frequency of Social Gatherings with Friends and Family: Three  times a week    Attends Religious Services: More than 4 times per year    Active Member of Clubs or Organizations: No  Attends Banker Meetings: Not on file    Marital Status: Married  Catering manager Violence: Not on file   Family History  Problem Relation Age of Onset   Colon polyps Mother    Arthritis Mother    Hearing loss Mother    Hyperlipidemia Mother    Hypertension Mother    Stroke Mother    Heart disease Mother    Colon polyps Father    Arthritis Father    Heart disease Father    Hyperlipidemia Father    Arthritis Maternal Grandmother    Arthritis Maternal Grandfather    Arthritis Paternal Grandmother    Arthritis Paternal Grandfather    Colon cancer Neg Hx    Esophageal cancer Neg Hx    Stomach cancer Neg Hx    Rectal cancer Neg Hx       Review of Systems  All other systems reviewed and are negative.      Objective:   Physical Exam Vitals reviewed.  Constitutional:      Appearance: She is well-developed.  HENT:     Head: Normocephalic and atraumatic.  Cardiovascular:     Rate and Rhythm: Normal rate and regular rhythm.     Heart sounds: No murmur heard.    No friction rub. No gallop.  Pulmonary:     Effort: Pulmonary effort is normal. No respiratory distress.     Breath sounds: Examination of the left-upper field reveals decreased breath sounds. Examination of the left-middle field reveals decreased breath sounds. Examination of the left-lower field reveals decreased breath sounds. Decreased breath sounds present. No wheezing, rhonchi or rales.  Chest:     Chest wall: No tenderness.  Skin:    General: Skin is warm and dry.     Coloration: Skin is not pale.     Findings: No erythema or rash.  Psychiatric:        Mood and Affect: Mood normal. Mood is not anxious.        Speech: Speech normal.        Behavior: Behavior normal.        Thought Content: Thought content normal.        Judgment: Judgment normal.            Assessment & Plan:  Dysuria - Plan: Urinalysis, Routine w reflex microscopic, Urine Culture There is no urinary tract infection based on the urinalysis today.  I question if she may have a yeast infection versus atrophic vaginitis versus interstitial cystitis.  I recommended a pelvic exam but the patient would like to try empiric estrogen cream first.  She prefer I not do a pelvic exam today due to discomfort.  I am willing to give her estrogen cream 1 applicatorful nightly for 2 weeks and then recheck.  If symptoms are improving, no further workup is necessary.  If symptoms or not improving I would need to do a pelvic exam to rule out any lesions on the vulva along with a  wet prep.  Patient is comfortable with this plan

## 2022-10-17 LAB — URINE CULTURE
MICRO NUMBER:: 14879824
SPECIMEN QUALITY:: ADEQUATE

## 2022-10-19 DIAGNOSIS — M5416 Radiculopathy, lumbar region: Secondary | ICD-10-CM | POA: Diagnosis not present

## 2022-10-23 ENCOUNTER — Other Ambulatory Visit: Payer: Self-pay | Admitting: Family Medicine

## 2022-10-25 ENCOUNTER — Other Ambulatory Visit: Payer: Self-pay | Admitting: Family Medicine

## 2022-10-29 ENCOUNTER — Institutional Professional Consult (permissible substitution): Payer: Medicare Other | Admitting: Neurology

## 2022-11-03 ENCOUNTER — Other Ambulatory Visit: Payer: Self-pay | Admitting: Family Medicine

## 2022-11-03 MED ORDER — ALPRAZOLAM 0.5 MG PO TABS: ORAL_TABLET | ORAL | 0 refills | Status: DC

## 2022-11-03 NOTE — Telephone Encounter (Signed)
Prescription Request  11/03/2022  LOV: 10/16/2022  What is the name of the medication or equipment? ALPRAZolam (XANAX) 0.5 MG tablet [[id number]]  Have you contacted your pharmacy to request a refill? Yes   Which pharmacy would you like this sent to?  [id number] [place], [place] - [address] [place] [address] Phone: [phone] Fax: [phone]    Patient notified that their request is being sent to the clinical staff for review and that they should receive a response within 2 business days.   Please advise at [facility] [phone]

## 2022-11-03 NOTE — Telephone Encounter (Signed)
Requested medication (s) are due for refill today - yes  Requested medication (s) are on the active medication list -yes  Future visit scheduled -yes  Last refill: 09/22/22 #90   Notes to clinic: non delegated Rx  Requested Prescriptions  Pending Prescriptions Disp Refills   ALPRAZolam (XANAX) 0.5 MG tablet [Pharmacy Med Name: ALPRAZOLAM 0.5 MG TABLET] 90 tablet 0    Sig: TAKE 1 TABLET BY MOUTH THREE TIMES A DAY AS NEEDED FOR ANXIETY     Not Delegated - Psychiatry: Anxiolytics/Hypnotics 2 Failed - 11/03/2022  4:04 PM      Failed - This refill cannot be delegated      Failed - Urine Drug Screen completed in last 360 days      Failed - Valid encounter within last 6 months    Recent Outpatient Visits           11 months ago Fatigue, unspecified type   [facility] Donita Brooks, MD   1 year ago Routine general medical examination at a health care facility   [facility] Pickard, Priscille Heidelberg, MD   2 years ago Pure hypercholesterolemia   [facility] Tanya Nones, Priscille Heidelberg, MD   2 years ago UTI symptoms   [facility] Lawson Fiscal A, FNP   3 years ago Osteoporosis without current pathological fracture, unspecified osteoporosis type   [facility] Pickard, Priscille Heidelberg, MD       Future Appointments             In 3 months Pickard, Priscille Heidelberg, MD [facility], [facility]            Passed - Patient is not pregnant         Requested Prescriptions  Pending Prescriptions Disp Refills   ALPRAZolam (XANAX) 0.5 MG tablet [Pharmacy Med Name: ALPRAZOLAM 0.5 MG TABLET] 90 tablet 0    Sig: TAKE 1 TABLET BY MOUTH THREE TIMES A DAY AS NEEDED FOR ANXIETY     Not Delegated - Psychiatry: Anxiolytics/Hypnotics 2 Failed - 11/03/2022  4:04 PM      Failed - This refill cannot be delegated      Failed - Urine Drug Screen completed in last 360 days      Failed - Valid encounter within last 6  months    Recent Outpatient Visits           11 months ago Fatigue, unspecified type   [facility] Donita Brooks, MD   1 year ago Routine general medical examination at a health care facility   [facility] Pickard, Priscille Heidelberg, MD   2 years ago Pure hypercholesterolemia   [facility] Tanya Nones, Priscille Heidelberg, MD   2 years ago UTI symptoms   [facility] Medicine Lawson Fiscal A, FNP   3 years ago Osteoporosis without current pathological fracture, unspecified osteoporosis type   [facility] Pickard, Priscille Heidelberg, MD       Future Appointments             In 3 months Pickard, Priscille Heidelberg, MD [facility], [facility]            Passed - Patient is not pregnant

## 2022-11-05 NOTE — Progress Notes (Signed)
Pt here for urine check due to blood in urine. Urine sample obtained and sent for culture per Dr. Caren Macadam order. Mjp,lpn

## 2022-11-11 DIAGNOSIS — M4326 Fusion of spine, lumbar region: Secondary | ICD-10-CM | POA: Diagnosis not present

## 2022-11-11 DIAGNOSIS — M5416 Radiculopathy, lumbar region: Secondary | ICD-10-CM | POA: Diagnosis not present

## 2022-11-11 DIAGNOSIS — M791 Myalgia, unspecified site: Secondary | ICD-10-CM | POA: Diagnosis not present

## 2022-11-23 ENCOUNTER — Telehealth: Payer: Self-pay

## 2022-11-23 NOTE — Telephone Encounter (Signed)
Prescription Request  11/23/2022  LOV: 10/16/22  What is the name of the medication or equipment? pantoprazole (PROTONIX) 40 MG tablet [[id number]]  DISCONTINUED  Have you contacted your pharmacy to request a refill? Yes   Which pharmacy would you like this sent to?  [id number] [place], [place] - [address] [place] [address] Phone: [phone] Fax: [phone]    Patient notified that their request is being sent to the clinical staff for review and that they should receive a response within 2 business days.   Please advise at [facility] [phone]

## 2022-11-30 ENCOUNTER — Telehealth: Payer: Self-pay | Admitting: Internal Medicine

## 2022-11-30 NOTE — Telephone Encounter (Signed)
PT is calling to get a hem band prior to colonoscopy scheduling in August. She wants to know if she can be referred to have it done with another provider considering he does not do banding.Please advise

## 2022-11-30 NOTE — Telephone Encounter (Signed)
Pt scheduled for hem banding with Dr. Leone Payor 12/31/22 at 3:10pm. Please notify pt of appt.

## 2022-11-30 NOTE — Telephone Encounter (Signed)
Patient has been notified of hem banding appt

## 2022-11-30 NOTE — Telephone Encounter (Signed)
See note below and advise who you would like her to see for banding.

## 2022-11-30 NOTE — Telephone Encounter (Signed)
Katelyn [name], Pyrtle.  Thanks

## 2022-12-08 DIAGNOSIS — R202 Paresthesia of skin: Secondary | ICD-10-CM | POA: Diagnosis not present

## 2022-12-08 DIAGNOSIS — M79604 Pain in right leg: Secondary | ICD-10-CM | POA: Diagnosis not present

## 2022-12-10 DIAGNOSIS — M5416 Radiculopathy, lumbar region: Secondary | ICD-10-CM | POA: Diagnosis not present

## 2022-12-10 DIAGNOSIS — M791 Myalgia, unspecified site: Secondary | ICD-10-CM | POA: Diagnosis not present

## 2022-12-16 ENCOUNTER — Other Ambulatory Visit: Payer: Self-pay | Admitting: Family Medicine

## 2022-12-16 ENCOUNTER — Encounter: Payer: Self-pay | Admitting: Internal Medicine

## 2022-12-17 NOTE — Telephone Encounter (Signed)
Requested medication (s) are due for refill today: Yes  Requested medication (s) are on the active medication list: Yes  Last refill:  11/03/22 90 tabs, 0RF  Future visit scheduled: Yes  Notes to clinic:  Unable to refill per protocol, cannot delegate.      Requested Prescriptions  Pending Prescriptions Disp Refills   ALPRAZolam (XANAX) 0.5 MG tablet [Pharmacy Med Name: ALPRAZOLAM 0.5 MG TABLET] 90 tablet 0    Sig: TAKE 1 TABLET BY MOUTH THREE TIMES A DAY AS NEEDED FOR ANXIETY     Not Delegated - Psychiatry: Anxiolytics/Hypnotics 2 Failed - 12/16/2022  7:34 PM      Failed - This refill cannot be delegated      Failed - Urine Drug Screen completed in last 360 days      Failed - Valid encounter within last 6 months    Recent Outpatient Visits           1 year ago Fatigue, unspecified type   [facility] Donita Brooks, MD   1 year ago Routine general medical examination at a health care facility   [facility] Pickard, Priscille Heidelberg, MD   2 years ago Pure hypercholesterolemia   [facility] Tanya Nones, Priscille Heidelberg, MD   2 years ago UTI symptoms   [facility] Lawson Fiscal A, FNP   3 years ago Osteoporosis without current pathological fracture, unspecified osteoporosis type   [facility] Pickard, Priscille Heidelberg, MD       Future Appointments             In 2 months Pickard, Priscille Heidelberg, MD [facility], [facility]            Passed - Patient is not pregnant

## 2022-12-28 ENCOUNTER — Encounter: Payer: Self-pay | Admitting: Internal Medicine

## 2022-12-31 ENCOUNTER — Ambulatory Visit: Payer: Medicare Other | Admitting: Internal Medicine

## 2022-12-31 ENCOUNTER — Encounter: Payer: Self-pay | Admitting: Internal Medicine

## 2022-12-31 VITALS — BP 112/70 | HR 72 | Ht 63.0 in | Wt 108.0 lb

## 2022-12-31 DIAGNOSIS — K641 Second degree hemorrhoids: Secondary | ICD-10-CM | POA: Diagnosis not present

## 2022-12-31 NOTE — Progress Notes (Signed)
   HEMORRHOID LIGATION  Indication: Symptomatic hemorrhoids with bleeding, soreness itching and burning symptoms.  Background of mild constipation and straining.  Colonoscopy up-to-date due for surveillance August 2024 Marina Goodell), internal hemorrhoids seen on last colonoscopy 2021.  Patti [place], CMA present.  Rectal exam there is a normal anoderm.  Digital exam shows normal to slightly decreased resting tone with no mass though hemorrhoids are palpable in the anal canal there is brown stool and some mucoid discharge noted.  No blood.  No rectocele.  Anoscopy shows grade 2 prolapsed internal in the left lateral and right posterior position and a grade 1 in the right anterior.  The left lateral complex is inflamed and friable and bleeds slightly.  There is some mucus in the rectum as well but no proctitis.  PROCEDURE NOTE: The patient presents with symptomatic grade 2  hemorrhoids, requesting rubber band ligation of his/her hemorrhoidal disease.  All risks, benefits and alternative forms of therapy were described and informed consent was obtained.   The anorectum was pre-medicated with 0.125% nitroglycerin and 5% lidocaine The decision was made to band the left lateral and right posterior internal hemorrhoids, and the [facility] O'Regan System was used to perform band ligation without complication.  Digital anorectal examination was then performed to assure proper positioning of the band, and to adjust the banded tissue as required.  The patient was discharged home without pain or other issues.  Dietary and behavioral recommendations were given and along with follow-up instructions.      Prolapsed internal hemorrhoids, grade 2 Observe after banding of RP and LL - Benefiber 1 tbsp every day - and if tolerated wuld continue Has colonoscopy 02/10/23 w/ JNP - reassess then - usually wait 2 months total for full response to banding but if symptomatic still at colonoscopy will then schedule Sept/Oct  appt  CC Yancey Flemings, MD

## 2022-12-31 NOTE — Assessment & Plan Note (Addendum)
Observe after banding of RP and LL - Benefiber 1 tbsp every day - and if tolerated wuld continue Has colonoscopy 02/10/23 w/ JNP - reassess then - usually wait 2 months total for full response to banding but if symptomatic still at colonoscopy will then schedule Sept/Oct appt

## 2022-12-31 NOTE — Patient Instructions (Addendum)
HEMORRHOID BANDING PROCEDURE    FOLLOW-UP CARE   The procedure you have had should have been relatively painless since the banding of the area involved does not have nerve endings and there is no pain sensation.  The rubber band cuts off the blood supply to the hemorrhoid and the band may fall off as soon as 48 hours after the banding (the band may occasionally be seen in the toilet bowl following a bowel movement). You may notice a temporary feeling of fullness in the rectum which should respond adequately to plain Tylenol or Motrin.  Following the banding, avoid strenuous exercise that evening and resume full activity the next day.  A sitz bath (soaking in a warm tub) or bidet is soothing, and can be useful for cleansing the area after bowel movements.     To avoid constipation, take two tablespoons of natural wheat bran, natural oat bran, flax, Benefiber or any over the counter fiber supplement and increase your water intake to 7-8 glasses daily.    Unless you have been prescribed anorectal medication, do not put anything inside your rectum for two weeks: No suppositories, enemas, fingers, etc.  Occasionally, you may have more bleeding than usual after the banding procedure.  This is often from the untreated hemorrhoids rather than the treated one.  Don't be concerned if there is a tablespoon or so of blood.  If there is more blood than this, lie flat with your bottom higher than your head and apply an ice pack to the area. If the bleeding does not stop within a half an hour or if you feel faint, call our office at [phone] or go to the emergency room.  Problems are not common; however, if there is a substantial amount of bleeding, severe pain, chills, fever or difficulty passing urine (very rare) or other problems, you should call us at [phone] or report to the nearest emergency room.  Do not stay seated continuously for more than 2-3 hours for a day or two after the procedure.   Tighten your buttock muscles 10-15 times every two hours and take 10-15 deep breaths every 1-2 hours.  Do not spend more than a few minutes on the toilet if you cannot empty your bowel; instead re-visit the toilet at a later time.   Per Dr Leone Payor take one tablespoon of Benefiber daily.   I appreciate the opportunity to care for you. Stan Head, MD, [facility]

## 2023-01-12 ENCOUNTER — Telehealth: Payer: Self-pay | Admitting: Internal Medicine

## 2023-01-12 NOTE — Telephone Encounter (Signed)
Pt stated that she recently had Hemorrhoid  banding done on 12/31/2022. Pt stated that on Sunday that she started to experience rectal bleeding after a BM. Pt stated that this has been, Sunday, Monday and Tuesday ( Today) Moderate amount of BRB per rectum after each BM. No pain nor discomfort at hemorrhoid site. Please advise

## 2023-01-12 NOTE — Telephone Encounter (Signed)
Inbound call from patient requesting to speak with a nurse in regards her hemorrhoid banding. She sated she is having some blood and want to know if its normal.Please advise

## 2023-01-13 ENCOUNTER — Other Ambulatory Visit (INDEPENDENT_AMBULATORY_CARE_PROVIDER_SITE_OTHER): Payer: Medicare Other

## 2023-01-13 ENCOUNTER — Other Ambulatory Visit: Payer: Self-pay

## 2023-01-13 DIAGNOSIS — K625 Hemorrhage of anus and rectum: Secondary | ICD-10-CM

## 2023-01-13 DIAGNOSIS — Z681 Body mass index (BMI) 19 or less, adult: Secondary | ICD-10-CM | POA: Diagnosis not present

## 2023-01-13 DIAGNOSIS — M461 Sacroiliitis, not elsewhere classified: Secondary | ICD-10-CM | POA: Diagnosis not present

## 2023-01-13 DIAGNOSIS — M5416 Radiculopathy, lumbar region: Secondary | ICD-10-CM | POA: Diagnosis not present

## 2023-01-13 LAB — CBC WITH DIFFERENTIAL/PLATELET
Basophils Absolute: 0 10*3/uL (ref 0.0–0.1)
Basophils Relative: 0.9 % (ref 0.0–3.0)
Eosinophils Absolute: 0.1 10*3/uL (ref 0.0–0.7)
Eosinophils Relative: 1 % (ref 0.0–5.0)
HCT: 40 % (ref 36.0–46.0)
Hemoglobin: 13.1 g/dL (ref 12.0–15.0)
Lymphocytes Relative: 32.7 % (ref 12.0–46.0)
Lymphs Abs: 1.8 10*3/uL (ref 0.7–4.0)
MCHC: 32.7 g/dL (ref 30.0–36.0)
MCV: 95.5 fl (ref 78.0–100.0)
Monocytes Absolute: 0.4 10*3/uL (ref 0.1–1.0)
Monocytes Relative: 8.2 % (ref 3.0–12.0)
Neutro Abs: 3.1 10*3/uL (ref 1.4–7.7)
Neutrophils Relative %: 57.2 % (ref 43.0–77.0)
Platelets: 291 10*3/uL (ref 150.0–400.0)
RBC: 4.19 Mil/uL (ref 3.87–5.11)
RDW: 13 % (ref 11.5–15.5)
WBC: 5.4 10*3/uL (ref 4.0–10.5)

## 2023-01-13 NOTE — Telephone Encounter (Signed)
Pt made aware of Dr. Leone Payor recommendations: Pt made aware that orders for the labs have been placed. Location to the lab provided.  Pt verbalized understanding with all questions answered.

## 2023-01-13 NOTE — Telephone Encounter (Signed)
Viviann Spare - let her know I am glad it has stopped - this does occur sometimes.  I would like her to get a CBC  Have cced Dr. Marina Goodell as an Lorain Childes

## 2023-01-13 NOTE — Telephone Encounter (Signed)
Order for lab placed in Epic: Left message for pt to call back:

## 2023-01-13 NOTE — Telephone Encounter (Signed)
PT is calling to let us know that the bleeding has subsided.

## 2023-01-14 ENCOUNTER — Encounter (HOSPITAL_COMMUNITY): Admission: EM | Disposition: A | Discharge status: Home / Self Care | Payer: Self-pay | Source: Home / Self Care

## 2023-01-14 ENCOUNTER — Encounter (HOSPITAL_COMMUNITY): Payer: Self-pay

## 2023-01-14 ENCOUNTER — Other Ambulatory Visit: Payer: Self-pay

## 2023-01-14 ENCOUNTER — Ambulatory Visit (HOSPITAL_COMMUNITY)
Admission: EM | Admit: 2023-01-14 | Discharge: 2023-01-14 | Disposition: A | Discharge status: Home / Self Care | Payer: Medicare Other

## 2023-01-14 DIAGNOSIS — K648 Other hemorrhoids: Secondary | ICD-10-CM | POA: Diagnosis not present

## 2023-01-14 DIAGNOSIS — K625 Hemorrhage of anus and rectum: Secondary | ICD-10-CM | POA: Diagnosis not present

## 2023-01-14 DIAGNOSIS — K640 First degree hemorrhoids: Secondary | ICD-10-CM | POA: Insufficient documentation

## 2023-01-14 DIAGNOSIS — G8929 Other chronic pain: Secondary | ICD-10-CM | POA: Insufficient documentation

## 2023-01-14 DIAGNOSIS — D125 Benign neoplasm of sigmoid colon: Secondary | ICD-10-CM

## 2023-01-14 DIAGNOSIS — Z09 Encounter for follow-up examination after completed treatment for conditions other than malignant neoplasm: Secondary | ICD-10-CM | POA: Diagnosis not present

## 2023-01-14 DIAGNOSIS — R519 Headache, unspecified: Secondary | ICD-10-CM

## 2023-01-14 DIAGNOSIS — K626 Ulcer of anus and rectum: Secondary | ICD-10-CM

## 2023-01-14 DIAGNOSIS — M549 Dorsalgia, unspecified: Secondary | ICD-10-CM

## 2023-01-14 DIAGNOSIS — Z86010 Personal history of colonic polyps: Secondary | ICD-10-CM | POA: Diagnosis not present

## 2023-01-14 DIAGNOSIS — K921 Melena: Secondary | ICD-10-CM | POA: Diagnosis not present

## 2023-01-14 DIAGNOSIS — D128 Benign neoplasm of rectum: Secondary | ICD-10-CM | POA: Diagnosis not present

## 2023-01-14 DIAGNOSIS — Z7982 Long term (current) use of aspirin: Secondary | ICD-10-CM | POA: Diagnosis not present

## 2023-01-14 DIAGNOSIS — Z79899 Other long term (current) drug therapy: Secondary | ICD-10-CM | POA: Insufficient documentation

## 2023-01-14 DIAGNOSIS — Z9889 Other specified postprocedural states: Secondary | ICD-10-CM | POA: Diagnosis not present

## 2023-01-14 HISTORY — PX: HEMOSTASIS CLIP PLACEMENT: SHX6857

## 2023-01-14 HISTORY — PX: FLEXIBLE SIGMOIDOSCOPY: SHX5431

## 2023-01-14 LAB — CBC WITH DIFFERENTIAL/PLATELET
Abs Immature Granulocytes: 0.01 10*3/uL (ref 0.00–0.07)
Basophils Absolute: 0 10*3/uL (ref 0.0–0.1)
Basophils Relative: 1 %
Eosinophils Absolute: 0.1 10*3/uL (ref 0.0–0.5)
Eosinophils Relative: 2 %
HCT: 42.7 % (ref 36.0–46.0)
Hemoglobin: 14 g/dL (ref 12.0–15.0)
Immature Granulocytes: 0 %
Lymphocytes Relative: 28 %
Lymphs Abs: 1.4 10*3/uL (ref 0.7–4.0)
MCH: 31.6 pg (ref 26.0–34.0)
MCHC: 32.8 g/dL (ref 30.0–36.0)
MCV: 96.4 fL (ref 80.0–100.0)
Monocytes Absolute: 0.5 10*3/uL (ref 0.1–1.0)
Monocytes Relative: 10 %
Neutro Abs: 3.1 10*3/uL (ref 1.7–7.7)
Neutrophils Relative %: 59 %
Platelets: 291 10*3/uL (ref 150–400)
RBC: 4.43 MIL/uL (ref 3.87–5.11)
RDW: 12.5 % (ref 11.5–15.5)
WBC: 5.2 10*3/uL (ref 4.0–10.5)
nRBC: 0 % (ref 0.0–0.2)

## 2023-01-14 LAB — TYPE AND SCREEN
ABO/RH(D): O POS
Antibody Screen: NEGATIVE

## 2023-01-14 LAB — BASIC METABOLIC PANEL
Anion gap: 9 (ref 5–15)
BUN: 10 mg/dL (ref 8–23)
CO2: 30 mmol/L (ref 22–32)
Calcium: 9.9 mg/dL (ref 8.9–10.3)
Chloride: 98 mmol/L (ref 98–111)
Creatinine, Ser: 0.61 mg/dL (ref 0.44–1.00)
GFR, Estimated: >60 mL/min (ref >60)
Glucose, Bld: 67 mg/dL — ABNORMAL LOW (ref 70–99)
Potassium: 3.9 mmol/L (ref 3.5–5.1)
Sodium: 137 mmol/L (ref 135–145)

## 2023-01-14 LAB — PROTIME-INR
INR: 1 (ref 0.8–1.2)
Prothrombin Time: 12.9 seconds (ref 11.4–15.2)

## 2023-01-14 SURGERY — SIGMOIDOSCOPY, FLEXIBLE

## 2023-01-14 NOTE — Telephone Encounter (Addendum)
She needs [place] to ED at [facility] as she probably needs a sigmoidoscopy to treat the bleeding - there may be a post-banding ulcer  Our on call people can see her  Katelyn [name] please look out for her

## 2023-01-14 NOTE — Interval H&P Note (Signed)
History and Physical Interval Note:  01/14/2023 2:36 PM  Katelyn [name]  has presented today for surgery, with the diagnosis of rectal bleeding.  The various methods of treatment have been discussed with the patient and family. After consideration of risks, benefits and other options for treatment, the patient has consented to  Procedure(s): FLEXIBLE SIGMOIDOSCOPY (N/A) as a surgical intervention.  The patient's history has been reviewed, patient examined, no change in status, stable for surgery.  I have reviewed the patient's chart and labs.  Questions were answered to the patient's satisfaction.     Venita Lick. Russella Dar

## 2023-01-14 NOTE — Telephone Encounter (Signed)
Spoke with patient regarding MD recommendations. Advised of [facility] location. Pt & husband verbalized all understanding and plan to go today.

## 2023-01-14 NOTE — Consult Note (Addendum)
Consultation Note   Referring Provider:  [facility] PCP: Donita Brooks, MD Primary Gastroenterologist: Yancey Flemings, MD        Reason for Consultation: rectal bleeding  DOA: 01/14/2023         Hospital Day: 1   ASSESSMENT & PLAN   77 yo female who is s/p internal hemorrhoid banding on 7/11. Presents with rectal bleeding with BMs.  Suspect post banding ulcer. She does have some vague RLQ crampy discomfort but ischemic colitis unlikely. Normal WBC. Overall bleeding sounds low volume. Hgb normal in 14 range. Will plan for unsedated flexible sigmoidoscopy today. Hopefully home after procedure.   History of adenomatous colon polyps.  Scheduled within the next few weeks to have polyp surveillance   Chronic back pain  Headaches, takes Imitrex as needed  See PMH for additional medical history    Attending Physician Note   I have taken a history, reviewed the chart and examined the patient. I performed more than 50% of this encounter in conjunction with the APP. I agree with the APP's note, impression and recommendations with my edits. My additional impressions and recommendations are as follows.   Low volume rectal bleeding 2 weeks after hemorrhoid banding, RP and LL hemorrhoids. R/O bleeding from post banding ulcer, hemorrhoids. Unsedated flex sigmoidoscopy today.   Claudette Head, MD [facility] See Loretha Stapler, [facility], for our on call provider    Pertinent GI History    Katelyn [name] underwent hemorrhoid banding in our office on 12/31/22.   Anoscopy shows grade 2 prolapsed internal in the left lateral and right posterior position and a grade 1 in the right anterior.  The left lateral complex is inflamed and friable and bleeds slightly.  There is some mucus in the rectum as well but no proctitis. The decision was made to band the left lateral and right posterior internal hemorrhoids, and the [facility] System was used to perform band ligation  without complication.     HISTORY OF PRESENT ILLNESS   Patient is a 77 y.o. year old female with a past medical history of colon polyps, chronic back pain and hemorrhoids. She underwent internal hemorrhoid banding in our office on 12/31/22. On 7/21 she developed rectal bleeding with BMs. Stools have been on soft to loose side after we started her on fiber but no having any more than 1-2 BMs a day. The rectal bleeding was only associated with BMs ( no bleeding in between). The bleeding continued for a day or two then stopped. She began have rectal bleeding with small clots yesterday. No rectal pain but has been having some vague RLQ crampy discomfort since bleeding started. No other GI complaints  Previous GI Evaluations  Aug 2021 polyp surveillance colonoscopy  -Good prep - Three 2 to 3 mm polyps in the descending colon and in the transverse colon, removed with a cold snare. Resected and retrieved. - Internal hemorrhoids. - The examination was otherwise normal on direct and retroflexion views.  Surgical [P], colon, transverse, descending, polyps (3) - TUBULAR ADENOMA (3 OF 6 FRAGMENTS) - BENIGN COLONIC MUCOSA (3 OF 6 FRAGMENTS) - NO HIGH GRADE DYSPLASIA OR MALIGNANCY IDENTIFIED  Labs and Imaging: Recent Labs    01/13/23 1445 01/14/23 1045  WBC  5.4 5.2  HGB 13.1 14.0  HCT 40.0 42.7  PLT 291.0 291   No results for input(s): "NA", "K", "CL", "CO2", "GLUCOSE", "BUN", "CREATININE", "CALCIUM" in the last 72 hours. No results for input(s): "PROT", "ALBUMIN", "AST", "ALT", "ALKPHOS", "BILITOT", "BILIDIR", "IBILI" in the last 72 hours. No results for input(s): "HEPBSAG", "HCVAB", "HEPAIGM", "HEPBIGM" in the last 72 hours. No results for input(s): "LABPROT", "INR" in the last 72 hours.    Past Medical History:  Diagnosis Date   Anxiety    Arthritis    cervical and lumbar DDD   Heart murmur    Hyperlipidemia    Hypertension    Osteoporosis     Past Surgical History:  Procedure  Laterality Date   BUNIONECTOMY     right   CERVICAL FUSION     x3   COLONOSCOPY     POLYPECTOMY     radiofrequency ablasion     SPINE SURGERY      Dr. Noel Gerold (x2, last Nov/15) C3-T4   UPPER GASTROINTESTINAL ENDOSCOPY      Family History  Problem Relation Age of Onset   Colon polyps Mother    Arthritis Mother    Hearing loss Mother    Hyperlipidemia Mother    Hypertension Mother    Stroke Mother    Heart disease Mother    Colon polyps Father    Arthritis Father    Heart disease Father    Hyperlipidemia Father    Arthritis Maternal Grandmother    Arthritis Maternal Grandfather    Arthritis Paternal Grandmother    Arthritis Paternal Grandfather    Colon cancer Neg Hx    Esophageal cancer Neg Hx    Stomach cancer Neg Hx    Rectal cancer Neg Hx     Prior to Admission medications   Medication Sig Start Date End Date Taking? Authorizing Provider  alendronate (FOSAMAX) 70 MG tablet TAKE 1 TABLET BY MOUTH EVERY 7 DAYS WITH A FULL GLASS OF WATER ON EMPTY STOMACH Patient taking differently: 70 mg See admin instructions. Take one tablet by mouth (70 mg) every 7 days with a full glass of water on empty stomach. 02/02/22   Donita Brooks, MD  ALPRAZolam Prudy Feeler) 0.5 MG tablet TAKE 1 TABLET BY MOUTH THREE TIMES A DAY AS NEEDED FOR ANXIETY 12/21/22   Donita Brooks, MD  Ascorbic Acid (VITAMIN C) 500 MG tablet Take 500 mg by mouth daily.      [provider]  aspirin 81 MG tablet Take 81 mg by mouth daily.      [provider]  B Complex Vitamins (VITAMIN B COMPLEX PO) Take 1 tablet by mouth daily.      [provider]  calcium carbonate (OS-CAL) 600 MG TABS Take 600 mg by mouth 2 (two) times daily with a meal.      [provider]  cephALEXin (KEFLEX) 500 MG capsule Take 1 capsule (500 mg total) by mouth 3 (three) times daily. 10/02/22   Donita Brooks, MD  diclofenac sodium (VOLTAREN) 1 % GEL Apply 2 g topically 4 (four) times daily.    [provider]  DULoxetine (CYMBALTA) 30 MG capsule Take 1 capsule (30 mg total) by mouth 2 (two) times daily. 09/08/17   Donita Brooks, MD  fish oil-omega-3 fatty acids 1000 MG capsule Take 1 g by mouth 2 (two) times daily.      [provider]  Garlic 1000 MG CAPS Take 1 capsule by  mouth daily.      [provider]  hydrochlorothiazide (HYDRODIURIL) 25 MG tablet TAKE 1 TABLET (25 MG TOTAL) BY MOUTH DAILY. 10/26/22   Donita Brooks, MD  HYDROcodone-acetaminophen (NORCO/VICODIN) 5-325 MG tablet  01/29/21   [provider]  Lysine 500 MG CAPS Take 1 capsule by mouth daily.      [provider]  magnesium oxide (MAG-OX) 400 MG tablet Take 400 mg by mouth daily.      [provider]  Multiple Vitamins-Minerals (MULTIVITAMIN WITH MINERALS) tablet Take 1 tablet by mouth daily.      [provider]  nortriptyline (PAMELOR) 10 MG capsule Take 10 mg by mouth See admin instructions. Take two capsules by mouth (20 mg) every morning and three capsules by mouth (30 mg) every evening.    [provider]  POTASSIUM GLUCONATE PO Take 1 tablet by mouth daily.      [provider]  PRESCRIPTION MEDICATION Trigger point injections.  Once every 3 months.    [provider]  simvastatin (ZOCOR) 20 MG tablet TAKE 1 TABLET BY MOUTH EVERYDAY AT BEDTIME 10/23/22   Donita Brooks, MD  SUMAtriptan (IMITREX) 100 MG tablet MAY REPEAT IN 2 HOURS IF HEADACHE PERSISTS OR RECURS. Patient taking differently: Take 100 mg by mouth See admin instructions. Take one tablet by mouth (100 mg).  May repeat in 2 hours if headache persists or recurs. 12/25/21   Donita Brooks, MD  traMADol (ULTRAM) 50 MG tablet Take 50 mg by mouth every 8 (eight) hours as needed. for pain 10/22/17   [provider]  vitamin E 400 UNIT capsule Take 400 Units by mouth daily.      [provider]  zolpidem (AMBIEN) 5 MG tablet TAKE 1 TABLET BY MOUTH AT BEDTIME  AS NEEDED FOR SLEEP Patient taking differently: Take 5 mg by mouth at bedtime as needed for sleep. 01/24/18   Donita Brooks, MD    No current facility-administered medications for this encounter.   Current Outpatient Medications  Medication Sig Dispense Refill   alendronate (FOSAMAX) 70 MG tablet TAKE 1 TABLET BY MOUTH EVERY 7 DAYS WITH A FULL GLASS OF WATER ON EMPTY STOMACH (Patient taking differently: 70 mg See admin instructions. Take one tablet by mouth (70 mg) every 7 days with a full glass of water on empty stomach.) 12 tablet 3   ALPRAZolam (XANAX) 0.5 MG tablet TAKE 1 TABLET BY MOUTH THREE TIMES A DAY AS NEEDED FOR ANXIETY 90 tablet 0   Ascorbic Acid (VITAMIN C) 500 MG tablet Take 500 mg by mouth daily.       aspirin 81 MG tablet Take 81 mg by mouth daily.       B Complex Vitamins (VITAMIN B COMPLEX PO) Take 1 tablet by mouth daily.       calcium carbonate (OS-CAL) 600 MG TABS Take 600 mg by mouth 2 (two) times daily with a meal.       cephALEXin (KEFLEX) 500 MG capsule Take 1 capsule (500 mg total) by mouth 3 (three) times daily. 21 capsule 0   diclofenac sodium (VOLTAREN) 1 % GEL Apply 2 g topically 4 (four) times daily.     DULoxetine (CYMBALTA) 30 MG capsule Take 1 capsule (30 mg total) by mouth 2 (two) times daily. 180 capsule 3   fish oil-omega-3 fatty acids 1000 MG capsule Take 1 g by mouth 2 (two) times daily.       Garlic 1000 MG  CAPS Take 1 capsule by mouth daily.       hydrochlorothiazide (HYDRODIURIL) 25 MG tablet TAKE 1 TABLET (25 MG TOTAL) BY MOUTH DAILY. 90 tablet 0   HYDROcodone-acetaminophen (NORCO/VICODIN) 5-325 MG tablet      Lysine 500 MG CAPS Take 1 capsule by mouth daily.       magnesium oxide (MAG-OX) 400 MG tablet Take 400 mg by mouth daily.       Multiple Vitamins-Minerals (MULTIVITAMIN WITH MINERALS) tablet Take 1 tablet by mouth daily.       nortriptyline (PAMELOR) 10 MG capsule Take 10 mg by mouth See admin instructions. Take two capsules by mouth (20 mg)  every morning and three capsules by mouth (30 mg) every evening.     POTASSIUM GLUCONATE PO Take 1 tablet by mouth daily.       PRESCRIPTION MEDICATION Trigger point injections.  Once every 3 months.     simvastatin (ZOCOR) 20 MG tablet TAKE 1 TABLET BY MOUTH EVERYDAY AT BEDTIME 90 tablet 0   SUMAtriptan (IMITREX) 100 MG tablet MAY REPEAT IN 2 HOURS IF HEADACHE PERSISTS OR RECURS. (Patient taking differently: Take 100 mg by mouth See admin instructions. Take one tablet by mouth (100 mg).  May repeat in 2 hours if headache persists or recurs.) 12 tablet 9   traMADol (ULTRAM) 50 MG tablet Take 50 mg by mouth every 8 (eight) hours as needed. for pain  0   vitamin E 400 UNIT capsule Take 400 Units by mouth daily.       zolpidem (AMBIEN) 5 MG tablet TAKE 1 TABLET BY MOUTH AT BEDTIME AS NEEDED FOR SLEEP (Patient taking differently: Take 5 mg by mouth at bedtime as needed for sleep.) 30 tablet 3    Allergies as of 01/14/2023 - Review Complete 01/14/2023  Allergen Reaction Noted   Iodine Hives 08/12/2017   Prednisone Hives 10/23/2013   Sulfa antibiotics Swelling 08/12/2017   Red dye Rash 05/31/2014    Social History   Socioeconomic History   Marital status: Married    Spouse name: Jomarie Longs   Number of children: 2   Years of education: [employer] education level: Associate degree: academic program  Occupational History   Occupation: Retired  Tobacco Use   Smoking status: Never   Smokeless tobacco: Never  Vaping Use   Vaping status: Never Used  Substance and Sexual Activity   Alcohol use: Yes    Alcohol/week: 7.0 standard drinks of alcohol    Types: 7 Glasses of wine per week    Comment: 1-2 rare   Drug use: No   Sexual activity: Yes  Other Topics Concern   Not on file  Social History Narrative   Lives with husband   Caffeine use: none   Social Determinants of Health   Financial Resource Strain: Low Risk  (10/16/2022)   Overall Financial Resource Strain (CARDIA)     Difficulty of Paying Living Expenses: Not hard at all  Food Insecurity: No Food Insecurity (10/16/2022)   Hunger Vital Sign    Worried About Running Out of Food in the Last Year: Never true    Ran Out of Food in the Last Year: Never true  Transportation Needs: No Transportation Needs (10/16/2022)   PRAPARE - Administrator, Civil Service (Medical): No    Lack of Transportation (Non-Medical): No  Physical Activity: Insufficiently Active (10/16/2022)   Exercise Vital Sign    Days of Exercise per Week: 4 days  Minutes of Exercise per Session: 30 min  Stress: No Stress Concern Present (10/16/2022)   [employer] of Occupational Health - Occupational Stress Questionnaire    Feeling of Stress : Not at all  Social Connections: Moderately Integrated (10/16/2022)   Social Connection and Isolation Panel [NHANES]    Frequency of Communication with Friends and Family: More than three times a week    Frequency of Social Gatherings with Friends and Family: Three times a week    Attends Religious Services: More than 4 times per year    Active Member of Clubs or Organizations: No    Attends Banker Meetings: Not on file    Marital Status: Married  Intimate Partner Violence: Unknown (09/24/2021)   Received from [facility]   HITS    Physically Hurt: Not on file    Insult or Talk Down To: Not on file    Threaten Physical Harm: Not on file    Scream or Curse: Not on file     Code Status   Code Status: Prior  Review of Systems: All systems reviewed and negative except where noted in HPI.  Physical Exam: Vital signs in last 24 hours: Temp:  [98.3 F (36.8 C)] 98.3 F (36.8 C) (07/25 1022) Pulse Rate:  [88] 88 (07/25 1022) Resp:  [16] 16 (07/25 1022) BP: (127)/(88) 127/88 (07/25 1022) SpO2:  [100 %] 100 % (07/25 1022) Weight:  [49 kg] 49 kg (07/25 1028)    General:  Pleasant female in NAD Psych:  Cooperative. Normal mood and affect Eyes: Pupils equal Ears:   Normal auditory acuity Nose: No deformity, discharge or lesions Neck:  Supple, no masses felt Lungs:  Clear to auscultation.  Heart:  Regular rate, regular rhythm.  Abdomen:  Soft, nondistended, mild RLQ tenderness, active bowel sounds, no masses felt Rectal :  Deferred Msk: Symmetrical without gross deformities.  Neurologic:  Alert, oriented, grossly normal neurologically Extremities : No edema Skin:  Intact without significant lesions.    Intake/Output from previous day: No intake/output data recorded. Intake/Output this shift:  No intake/output data recorded.  Active Problems:   * No active hospital problems. Willette Cluster, NP-C @  01/14/2023, 11:14 AM

## 2023-01-14 NOTE — Telephone Encounter (Signed)
Patient called back & bleeding has returned today. Moderate amount of bleeding with clots (some look to be about the size of a quarter) that occurred with two bowel movements this morning. She does have some lower abdominal cramping, but is unsure if that is related. Banding was completed on 12/31/22. Labs were normal yesterday. Pt is leaving tomorrow for a family vacation & would like this taken care of before she leaves.

## 2023-01-14 NOTE — H&P (View-Only) (Signed)
Consultation Note   Referring Provider:  [facility] PCP: Donita Brooks, MD Primary Gastroenterologist: Yancey Flemings, MD        Reason for Consultation: rectal bleeding  DOA: 01/14/2023         Hospital Day: 1   ASSESSMENT & PLAN   77 yo female who is s/p internal hemorrhoid banding on 7/11. Presents with rectal bleeding with BMs.  Suspect post banding ulcer. She does have some vague RLQ crampy discomfort but ischemic colitis unlikely. Normal WBC. Overall bleeding sounds low volume. Hgb normal in 14 range. Will plan for unsedated flexible sigmoidoscopy today. Hopefully home after procedure.   History of adenomatous colon polyps.  Scheduled within the next few weeks to have polyp surveillance   Chronic back pain  Headaches, takes Imitrex as needed  See PMH for additional medical history    Attending Physician Note   I have taken a history, reviewed the chart and examined the patient. I performed more than 50% of this encounter in conjunction with the APP. I agree with the APP's note, impression and recommendations with my edits. My additional impressions and recommendations are as follows.   Low volume rectal bleeding 2 weeks after hemorrhoid banding, RP and LL hemorrhoids. R/O bleeding from post banding ulcer, hemorrhoids. Unsedated flex sigmoidoscopy today.   Claudette Head, MD [facility] See Loretha Stapler, [facility], for our on call provider    Pertinent GI History    Katelyn [name] underwent hemorrhoid banding in our office on 12/31/22.   Anoscopy shows grade 2 prolapsed internal in the left lateral and right posterior position and a grade 1 in the right anterior.  The left lateral complex is inflamed and friable and bleeds slightly.  There is some mucus in the rectum as well but no proctitis. The decision was made to band the left lateral and right posterior internal hemorrhoids, and the [facility] System was used to perform band ligation  without complication.     HISTORY OF PRESENT ILLNESS   Patient is a 77 y.o. year old female with a past medical history of colon polyps, chronic back pain and hemorrhoids. She underwent internal hemorrhoid banding in our office on 12/31/22. On 7/21 she developed rectal bleeding with BMs. Stools have been on soft to loose side after we started her on fiber but no having any more than 1-2 BMs a day. The rectal bleeding was only associated with BMs ( no bleeding in between). The bleeding continued for a day or two then stopped. She began have rectal bleeding with small clots yesterday. No rectal pain but has been having some vague RLQ crampy discomfort since bleeding started. No other GI complaints  Previous GI Evaluations  Aug 2021 polyp surveillance colonoscopy  -Good prep - Three 2 to 3 mm polyps in the descending colon and in the transverse colon, removed with a cold snare. Resected and retrieved. - Internal hemorrhoids. - The examination was otherwise normal on direct and retroflexion views.  Surgical [P], colon, transverse, descending, polyps (3) - TUBULAR ADENOMA (3 OF 6 FRAGMENTS) - BENIGN COLONIC MUCOSA (3 OF 6 FRAGMENTS) - NO HIGH GRADE DYSPLASIA OR MALIGNANCY IDENTIFIED  Labs and Imaging: Recent Labs    01/13/23 1445 01/14/23 1045  WBC  5.4 5.2  HGB 13.1 14.0  HCT 40.0 42.7  PLT 291.0 291   No results for input(s): "NA", "K", "CL", "CO2", "GLUCOSE", "BUN", "CREATININE", "CALCIUM" in the last 72 hours. No results for input(s): "PROT", "ALBUMIN", "AST", "ALT", "ALKPHOS", "BILITOT", "BILIDIR", "IBILI" in the last 72 hours. No results for input(s): "HEPBSAG", "HCVAB", "HEPAIGM", "HEPBIGM" in the last 72 hours. No results for input(s): "LABPROT", "INR" in the last 72 hours.    Past Medical History:  Diagnosis Date   Anxiety    Arthritis    cervical and lumbar DDD   Heart murmur    Hyperlipidemia    Hypertension    Osteoporosis     Past Surgical History:  Procedure  Laterality Date   BUNIONECTOMY     right   CERVICAL FUSION     x3   COLONOSCOPY     POLYPECTOMY     radiofrequency ablasion     SPINE SURGERY      Dr. Noel Gerold (x2, last Nov/15) C3-T4   UPPER GASTROINTESTINAL ENDOSCOPY      Family History  Problem Relation Age of Onset   Colon polyps Mother    Arthritis Mother    Hearing loss Mother    Hyperlipidemia Mother    Hypertension Mother    Stroke Mother    Heart disease Mother    Colon polyps Father    Arthritis Father    Heart disease Father    Hyperlipidemia Father    Arthritis Maternal Grandmother    Arthritis Maternal Grandfather    Arthritis Paternal Grandmother    Arthritis Paternal Grandfather    Colon cancer Neg Hx    Esophageal cancer Neg Hx    Stomach cancer Neg Hx    Rectal cancer Neg Hx     Prior to Admission medications   Medication Sig Start Date End Date Taking? Authorizing Provider  alendronate (FOSAMAX) 70 MG tablet TAKE 1 TABLET BY MOUTH EVERY 7 DAYS WITH A FULL GLASS OF WATER ON EMPTY STOMACH Patient taking differently: 70 mg See admin instructions. Take one tablet by mouth (70 mg) every 7 days with a full glass of water on empty stomach. 02/02/22   Donita Brooks, MD  ALPRAZolam Prudy Feeler) 0.5 MG tablet TAKE 1 TABLET BY MOUTH THREE TIMES A DAY AS NEEDED FOR ANXIETY 12/21/22   Donita Brooks, MD  Ascorbic Acid (VITAMIN C) 500 MG tablet Take 500 mg by mouth daily.      [provider]  aspirin 81 MG tablet Take 81 mg by mouth daily.      [provider]  B Complex Vitamins (VITAMIN B COMPLEX PO) Take 1 tablet by mouth daily.      [provider]  calcium carbonate (OS-CAL) 600 MG TABS Take 600 mg by mouth 2 (two) times daily with a meal.      [provider]  cephALEXin (KEFLEX) 500 MG capsule Take 1 capsule (500 mg total) by mouth 3 (three) times daily. 10/02/22   Donita Brooks, MD  diclofenac sodium (VOLTAREN) 1 % GEL Apply 2 g topically 4 (four) times daily.    [provider]  DULoxetine (CYMBALTA) 30 MG capsule Take 1 capsule (30 mg total) by mouth 2 (two) times daily. 09/08/17   Donita Brooks, MD  fish oil-omega-3 fatty acids 1000 MG capsule Take 1 g by mouth 2 (two) times daily.      [provider]  Garlic 1000 MG CAPS Take 1 capsule by  mouth daily.      [provider]  hydrochlorothiazide (HYDRODIURIL) 25 MG tablet TAKE 1 TABLET (25 MG TOTAL) BY MOUTH DAILY. 10/26/22   Donita Brooks, MD  HYDROcodone-acetaminophen (NORCO/VICODIN) 5-325 MG tablet  01/29/21   [provider]  Lysine 500 MG CAPS Take 1 capsule by mouth daily.      [provider]  magnesium oxide (MAG-OX) 400 MG tablet Take 400 mg by mouth daily.      [provider]  Multiple Vitamins-Minerals (MULTIVITAMIN WITH MINERALS) tablet Take 1 tablet by mouth daily.      [provider]  nortriptyline (PAMELOR) 10 MG capsule Take 10 mg by mouth See admin instructions. Take two capsules by mouth (20 mg) every morning and three capsules by mouth (30 mg) every evening.    [provider]  POTASSIUM GLUCONATE PO Take 1 tablet by mouth daily.      [provider]  PRESCRIPTION MEDICATION Trigger point injections.  Once every 3 months.    [provider]  simvastatin (ZOCOR) 20 MG tablet TAKE 1 TABLET BY MOUTH EVERYDAY AT BEDTIME 10/23/22   Donita Brooks, MD  SUMAtriptan (IMITREX) 100 MG tablet MAY REPEAT IN 2 HOURS IF HEADACHE PERSISTS OR RECURS. Patient taking differently: Take 100 mg by mouth See admin instructions. Take one tablet by mouth (100 mg).  May repeat in 2 hours if headache persists or recurs. 12/25/21   Donita Brooks, MD  traMADol (ULTRAM) 50 MG tablet Take 50 mg by mouth every 8 (eight) hours as needed. for pain 10/22/17   [provider]  vitamin E 400 UNIT capsule Take 400 Units by mouth daily.      [provider]  zolpidem (AMBIEN) 5 MG tablet TAKE 1 TABLET BY MOUTH AT BEDTIME  AS NEEDED FOR SLEEP Patient taking differently: Take 5 mg by mouth at bedtime as needed for sleep. 01/24/18   Donita Brooks, MD    No current facility-administered medications for this encounter.   Current Outpatient Medications  Medication Sig Dispense Refill   alendronate (FOSAMAX) 70 MG tablet TAKE 1 TABLET BY MOUTH EVERY 7 DAYS WITH A FULL GLASS OF WATER ON EMPTY STOMACH (Patient taking differently: 70 mg See admin instructions. Take one tablet by mouth (70 mg) every 7 days with a full glass of water on empty stomach.) 12 tablet 3   ALPRAZolam (XANAX) 0.5 MG tablet TAKE 1 TABLET BY MOUTH THREE TIMES A DAY AS NEEDED FOR ANXIETY 90 tablet 0   Ascorbic Acid (VITAMIN C) 500 MG tablet Take 500 mg by mouth daily.       aspirin 81 MG tablet Take 81 mg by mouth daily.       B Complex Vitamins (VITAMIN B COMPLEX PO) Take 1 tablet by mouth daily.       calcium carbonate (OS-CAL) 600 MG TABS Take 600 mg by mouth 2 (two) times daily with a meal.       cephALEXin (KEFLEX) 500 MG capsule Take 1 capsule (500 mg total) by mouth 3 (three) times daily. 21 capsule 0   diclofenac sodium (VOLTAREN) 1 % GEL Apply 2 g topically 4 (four) times daily.     DULoxetine (CYMBALTA) 30 MG capsule Take 1 capsule (30 mg total) by mouth 2 (two) times daily. 180 capsule 3   fish oil-omega-3 fatty acids 1000 MG capsule Take 1 g by mouth 2 (two) times daily.       Garlic 1000 MG  CAPS Take 1 capsule by mouth daily.       hydrochlorothiazide (HYDRODIURIL) 25 MG tablet TAKE 1 TABLET (25 MG TOTAL) BY MOUTH DAILY. 90 tablet 0   HYDROcodone-acetaminophen (NORCO/VICODIN) 5-325 MG tablet      Lysine 500 MG CAPS Take 1 capsule by mouth daily.       magnesium oxide (MAG-OX) 400 MG tablet Take 400 mg by mouth daily.       Multiple Vitamins-Minerals (MULTIVITAMIN WITH MINERALS) tablet Take 1 tablet by mouth daily.       nortriptyline (PAMELOR) 10 MG capsule Take 10 mg by mouth See admin instructions. Take two capsules by mouth (20 mg)  every morning and three capsules by mouth (30 mg) every evening.     POTASSIUM GLUCONATE PO Take 1 tablet by mouth daily.       PRESCRIPTION MEDICATION Trigger point injections.  Once every 3 months.     simvastatin (ZOCOR) 20 MG tablet TAKE 1 TABLET BY MOUTH EVERYDAY AT BEDTIME 90 tablet 0   SUMAtriptan (IMITREX) 100 MG tablet MAY REPEAT IN 2 HOURS IF HEADACHE PERSISTS OR RECURS. (Patient taking differently: Take 100 mg by mouth See admin instructions. Take one tablet by mouth (100 mg).  May repeat in 2 hours if headache persists or recurs.) 12 tablet 9   traMADol (ULTRAM) 50 MG tablet Take 50 mg by mouth every 8 (eight) hours as needed. for pain  0   vitamin E 400 UNIT capsule Take 400 Units by mouth daily.       zolpidem (AMBIEN) 5 MG tablet TAKE 1 TABLET BY MOUTH AT BEDTIME AS NEEDED FOR SLEEP (Patient taking differently: Take 5 mg by mouth at bedtime as needed for sleep.) 30 tablet 3    Allergies as of 01/14/2023 - Review Complete 01/14/2023  Allergen Reaction Noted   Iodine Hives 08/12/2017   Prednisone Hives 10/23/2013   Sulfa antibiotics Swelling 08/12/2017   Red dye Rash 05/31/2014    Social History   Socioeconomic History   Marital status: Married    Spouse name: Jomarie Longs   Number of children: 2   Years of education: [employer] education level: Associate degree: academic program  Occupational History   Occupation: Retired  Tobacco Use   Smoking status: Never   Smokeless tobacco: Never  Vaping Use   Vaping status: Never Used  Substance and Sexual Activity   Alcohol use: Yes    Alcohol/week: 7.0 standard drinks of alcohol    Types: 7 Glasses of wine per week    Comment: 1-2 rare   Drug use: No   Sexual activity: Yes  Other Topics Concern   Not on file  Social History Narrative   Lives with husband   Caffeine use: none   Social Determinants of Health   Financial Resource Strain: Low Risk  (10/16/2022)   Overall Financial Resource Strain (CARDIA)     Difficulty of Paying Living Expenses: Not hard at all  Food Insecurity: No Food Insecurity (10/16/2022)   Hunger Vital Sign    Worried About Running Out of Food in the Last Year: Never true    Ran Out of Food in the Last Year: Never true  Transportation Needs: No Transportation Needs (10/16/2022)   PRAPARE - Administrator, Civil Service (Medical): No    Lack of Transportation (Non-Medical): No  Physical Activity: Insufficiently Active (10/16/2022)   Exercise Vital Sign    Days of Exercise per Week: 4 days  Minutes of Exercise per Session: 30 min  Stress: No Stress Concern Present (10/16/2022)   [employer] of Occupational Health - Occupational Stress Questionnaire    Feeling of Stress : Not at all  Social Connections: Moderately Integrated (10/16/2022)   Social Connection and Isolation Panel [NHANES]    Frequency of Communication with Friends and Family: More than three times a week    Frequency of Social Gatherings with Friends and Family: Three times a week    Attends Religious Services: More than 4 times per year    Active Member of Clubs or Organizations: No    Attends Banker Meetings: Not on file    Marital Status: Married  Intimate Partner Violence: Unknown (09/24/2021)   Received from [facility]   HITS    Physically Hurt: Not on file    Insult or Talk Down To: Not on file    Threaten Physical Harm: Not on file    Scream or Curse: Not on file     Code Status   Code Status: Prior  Review of Systems: All systems reviewed and negative except where noted in HPI.  Physical Exam: Vital signs in last 24 hours: Temp:  [98.3 F (36.8 C)] 98.3 F (36.8 C) (07/25 1022) Pulse Rate:  [88] 88 (07/25 1022) Resp:  [16] 16 (07/25 1022) BP: (127)/(88) 127/88 (07/25 1022) SpO2:  [100 %] 100 % (07/25 1022) Weight:  [49 kg] 49 kg (07/25 1028)    General:  Pleasant female in NAD Psych:  Cooperative. Normal mood and affect Eyes: Pupils equal Ears:   Normal auditory acuity Nose: No deformity, discharge or lesions Neck:  Supple, no masses felt Lungs:  Clear to auscultation.  Heart:  Regular rate, regular rhythm.  Abdomen:  Soft, nondistended, mild RLQ tenderness, active bowel sounds, no masses felt Rectal :  Deferred Msk: Symmetrical without gross deformities.  Neurologic:  Alert, oriented, grossly normal neurologically Extremities : No edema Skin:  Intact without significant lesions.    Intake/Output from previous day: No intake/output data recorded. Intake/Output this shift:  No intake/output data recorded.  Active Problems:   * No active hospital problems. Willette Cluster, NP-C @  01/14/2023, 11:14 AM

## 2023-01-14 NOTE — ED Provider Notes (Signed)
[place] EMERGENCY DEPARTMENT AT [facility] Provider Note   CSN: [id number] Arrival date & time: 01/14/23  1016     History  Chief Complaint  Patient presents with   Rectal Bleeding    Katelyn [name] is a 77 y.o. female.  77 year old female present emergency department with rectal bleeding.  Reports having internal hemorrhoids had banding done on the 11th.  She had some minor bleeding but for the past 2 days states that bleeding is increased.  Bright red blood.  Only with bowel movements.  No other symptoms, no lightheadedness, dizziness chest pain, shortness of breath.   Rectal Bleeding      Home Medications Prior to Admission medications   Medication Sig Start Date End Date Taking? Authorizing Provider  alendronate (FOSAMAX) 70 MG tablet TAKE 1 TABLET BY MOUTH EVERY 7 DAYS WITH A FULL GLASS OF WATER ON EMPTY STOMACH Patient taking differently: 70 mg See admin instructions. Take one tablet by mouth (70 mg) every 7 days with a full glass of water on empty stomach. 02/02/22   Donita Brooks, MD  ALPRAZolam Prudy Feeler) 0.5 MG tablet TAKE 1 TABLET BY MOUTH THREE TIMES A DAY AS NEEDED FOR ANXIETY 12/21/22   Donita Brooks, MD  Ascorbic Acid (VITAMIN C) 500 MG tablet Take 500 mg by mouth daily.      [provider]  aspirin 81 MG tablet Take 81 mg by mouth daily.      [provider]  B Complex Vitamins (VITAMIN B COMPLEX PO) Take 1 tablet by mouth daily.      [provider]  calcium carbonate (OS-CAL) 600 MG TABS Take 600 mg by mouth 2 (two) times daily with a meal.      [provider]  cephALEXin (KEFLEX) 500 MG capsule Take 1 capsule (500 mg total) by mouth 3 (three) times daily. 10/02/22   Donita Brooks, MD  diclofenac sodium (VOLTAREN) 1 % GEL Apply 2 g topically 4 (four) times daily.    [provider]  DULoxetine (CYMBALTA) 30 MG capsule Take 1 capsule (30 mg total) by mouth 2 (two) times daily. 09/08/17   Donita Brooks, MD  fish oil-omega-3 fatty acids 1000 MG capsule Take 1 g by mouth 2 (two) times daily.      [provider]  Garlic 1000 MG CAPS Take 1 capsule by mouth daily.      [provider]  hydrochlorothiazide (HYDRODIURIL) 25 MG tablet TAKE 1 TABLET (25 MG TOTAL) BY MOUTH DAILY. 10/26/22   Donita Brooks, MD  HYDROcodone-acetaminophen (NORCO/VICODIN) 5-325 MG tablet  01/29/21   [provider]  Lysine 500 MG CAPS Take 1 capsule by mouth daily.      [provider]  magnesium oxide (MAG-OX) 400 MG tablet Take 400 mg by mouth daily.      [provider]  Multiple Vitamins-Minerals (MULTIVITAMIN WITH MINERALS) tablet Take 1 tablet by mouth daily.      [provider]  nortriptyline (PAMELOR) 10 MG capsule Take 10 mg by mouth See admin instructions. Take two capsules by mouth (20 mg) every morning and three capsules by mouth (30 mg) every evening.    [provider]  POTASSIUM GLUCONATE PO Take 1 tablet by mouth daily.      [provider]  PRESCRIPTION MEDICATION Trigger point injections.  Once every 3 months.    [provider]  simvastatin (ZOCOR) 20 MG tablet TAKE 1 TABLET BY MOUTH  EVERYDAY AT BEDTIME 10/23/22   Donita Brooks, MD  SUMAtriptan (IMITREX) 100 MG tablet MAY REPEAT IN 2 HOURS IF HEADACHE PERSISTS OR RECURS. Patient taking differently: Take 100 mg by mouth See admin instructions. Take one tablet by mouth (100 mg).  May repeat in 2 hours if headache persists or recurs. 12/25/21   Donita Brooks, MD  traMADol (ULTRAM) 50 MG tablet Take 50 mg by mouth every 8 (eight) hours as needed. for pain 10/22/17   [provider]  vitamin E 400 UNIT capsule Take 400 Units by mouth daily.      [provider]  zolpidem (AMBIEN) 5 MG tablet TAKE 1 TABLET BY MOUTH AT BEDTIME AS NEEDED FOR SLEEP Patient taking differently: Take 5 mg by mouth at bedtime as needed for sleep. 01/24/18   Donita Brooks, MD       Allergies    Iodine, Prednisone, Sulfa antibiotics, and Red dye    Review of Systems   Review of Systems  Gastrointestinal:  Positive for hematochezia.    Physical Exam Updated Vital Signs BP (!) 143/96   Pulse 68   Temp 97.9 F (36.6 C) (Temporal)   Resp 14   Ht 5\' 3"  (1.6 m)   Wt 49 kg   SpO2 96%   BMI 19.14 kg/m  Physical Exam Vitals and nursing note reviewed.  Constitutional:      General: She is not in acute distress.    Appearance: She is not toxic-appearing.  HENT:     Nose: Nose normal.     Mouth/Throat:     Mouth: Mucous membranes are moist.  Eyes:     Conjunctiva/sclera: Conjunctivae normal.  Cardiovascular:     Rate and Rhythm: Normal rate and regular rhythm.  Pulmonary:     Effort: Pulmonary effort is normal.     Breath sounds: Normal breath sounds.  Abdominal:     General: Abdomen is flat. There is no distension.     Palpations: Abdomen is soft.     Tenderness: There is no abdominal tenderness. There is no guarding or rebound.  Musculoskeletal:        General: Normal range of motion.  Skin:    General: Skin is warm and dry.     Capillary Refill: Capillary refill takes less than 2 seconds.  Neurological:     General: No focal deficit present.     Mental Status: She is alert.  Psychiatric:        Mood and Affect: Mood normal.        Behavior: Behavior normal.     ED Results / Procedures / Treatments   Labs (all labs ordered are listed, but only abnormal results are displayed) Labs Reviewed  BASIC METABOLIC PANEL - Abnormal; Notable for the following components:      Result Value   Glucose, Bld 67 (*)    All other components within normal limits  CBC WITH DIFFERENTIAL/PLATELET  PROTIME-INR  TYPE AND SCREEN    EKG None  Radiology No results found.  Procedures Procedures    Medications Ordered in ED Medications - No data to display  ED Course/ Medical Decision Making/ A&P Clinical Course as of 01/14/23 1555  Thu Jan 14, 2023  1034 Per chart review, Internal hemorroid banding with Dr. Leone Payor on 7/11. Also appears they are planning for colonoscopy next month.  [TY]  1254 GI evaluated patient, plan for flexible sigmoidoscopy today and likely d/c after.  [TY]  Clinical Course User Index [TY] Coral Spikes, DO                             Medical Decision Making 77 year old female present emergency department for rectal bleeding.  She is afebrile nontachycardic hemodynamically stable.  She was directed to come by GI office for evaluation.  Patient is seemingly only having blood with bowel movements and is currently asymptomatic.  Will get labs to check for acute blood loss anemia.  Will likely plan to discuss case with GI.  See ED course for further MDM and disposition.  Amount and/or Complexity of Data Reviewed Independent Historian: spouse    Details: Reports patient is not on blood thinner External Data Reviewed: notes.    Details: See ED course Labs: ordered. Radiology:  Decision-making details documented in ED Course. ECG/medicine tests:  Decision-making details documented in ED Course.          Final Clinical Impression(s) / ED Diagnoses Final diagnoses:  None    Rx / DC Orders ED Discharge Orders     None         Coral Spikes, DO 01/14/23 1555

## 2023-01-14 NOTE — ED Triage Notes (Signed)
Patient presented to ER for rectal bleeding, patient stated she had 2 hemorrhoids banded on 6/11, but starting Sunday she has had a lot of blood and clotting when having a bowel movement. Patient states no blood on underwear only when going to the bathroom. Also endorses lower back pain.

## 2023-01-14 NOTE — Telephone Encounter (Signed)
PT returning call to speak about hemorrhoids that are bleeding and clotting. She leaves for vacation tomorrow and wants to be seen today. I advised of no same day appointments.

## 2023-01-14 NOTE — Op Note (Signed)
[facility] Patient Name: Katelyn [name] Procedure Date: 01/14/2023 MRN: [id number] Attending MD: Meryl Dare , MD, [phone] Date of Birth: 08/04/1945 CSN: [id number] Age: 77 Admit Type: Emergency Department Procedure:                Flexible Sigmoidoscopy Indications:              Hematochezia post hemorrhoid banding Providers:                Venita Lick. Russella Dar, MD, Stephens Shire RN, RN, Beryle Beams, Technician Referring MD:             Iva Boop, MD Medicines:                None Complications:            No immediate complications. Estimated Blood Loss:     Estimated blood loss: none. Procedure:                Pre-Anesthesia Assessment:                           - Prior to the procedure, a History and Physical                            was performed, and patient medications and                            allergies were reviewed. The patient's tolerance of                            previous anesthesia was also reviewed. The risks                            and benefits of the procedure and the sedation                            options and risks were discussed with the patient.                            All questions were answered, and informed consent                            was obtained. Prior Anticoagulants: The patient has                            taken no anticoagulant or antiplatelet agents. ASA                            Grade Assessment: II - A patient with mild systemic                            disease. After reviewing the risks and benefits,  the patient was deemed in satisfactory condition to                            undergo the procedure.                           After obtaining informed consent, the scope was                            passed under direct vision. The [id number]-HQ190L                            ([id number]) Olympus colonoscope was introduced                             through the anus and advanced to 25 cm in the                            sigmoid colon. The flexible sigmoidoscopy was                            accomplished without difficulty. The patient                            tolerated the procedure well. The flexible                            sigmoidoscopy was accomplished without difficulty.                            The quality of the bowel preparation was                            inadequate, unprepped exam. Scope In: Scope Out: Findings:      The perianal and digital rectal examinations were normal.      Two 8 mm ulcers were found in the distal rectum. No bleeding was       present. Stigmata of recent bleeding were present with small clot on one       ulcer. A retained hemorrhoid band was present on one ulcer and it was       removed with a biopsy forceps to allow clip placement. For hemostasis,       four hemostatic clips were successfully placed (MR conditional): 3 on       one ulcer and 1 on the other ulcer. Clip manufacturer: [employer]. There was no bleeding during, or at the end, of the       procedure.      Internal hemorrhoids were found during retroflexion. The hemorrhoids       were small and Grade I (internal hemorrhoids that do not prolapse).      Three sessile polyps were found in the rectum (1) and sigmoid colon (2).       The polyps were 5 to 7 mm in size.      The exam was otherwise without abnormality to 25 cm in the sigmoid colon. Impression:               -  Preparation of the colon was inadequate.                           - Two ulcers in the distal rectum c/w post banding                            ulcers. One retained hemorrhoid band removed with                            cold forceps Clips (MR conditional) were placed.                            Clip manufacturer: [employer].                           - Internal hemorrhoids.                           - Three 5 to 7 mm polyps in the rectum and  in the                            sigmoid colon.                           - The examination was otherwise normal.                           - No specimens collected. Moderate Sedation:      No sedation Recommendation:           - Patient has a contact number available for                            emergencies. The signs and symptoms of potential                            delayed complications were discussed with the                            patient. Return to normal activities tomorrow.                            Written discharge instructions were provided to the                            patient.                           - Resume previous diet today.                           - No NSAIDs for 2 weeks. ASA 81 mg qd is ok.                           - Perform a colonoscopy at appointment to be  scheduled with Dr. Yancey Flemings for polypectomies. Procedure Code(s):        --- Professional ---                           [id number], Sigmoidoscopy, flexible; with control of                            bleeding, any method Diagnosis Code(s):        --- Professional ---                           K64.0, First degree hemorrhoids                           K62.6, Ulcer of anus and rectum                           D12.8, Benign neoplasm of rectum                           D12.5, Benign neoplasm of sigmoid colon                           K92.1, Melena (includes Hematochezia) CPT copyright 2022 American [employer] Association. All rights reserved. The codes documented in this report are preliminary and upon coder review may  be revised to meet current compliance requirements. Meryl Dare, MD 01/14/2023 3:25:47 PM This report has been signed electronically. Number of Addenda: 0

## 2023-01-18 ENCOUNTER — Other Ambulatory Visit: Payer: Self-pay | Admitting: Family Medicine

## 2023-01-18 ENCOUNTER — Encounter (HOSPITAL_COMMUNITY): Payer: Self-pay | Admitting: Gastroenterology

## 2023-01-19 ENCOUNTER — Other Ambulatory Visit: Payer: Self-pay | Admitting: Family Medicine

## 2023-01-24 ENCOUNTER — Other Ambulatory Visit: Payer: Self-pay | Admitting: Family Medicine

## 2023-01-26 ENCOUNTER — Ambulatory Visit (AMBULATORY_SURGERY_CENTER): Payer: Medicare Other | Admitting: *Deleted

## 2023-01-26 VITALS — Ht 63.0 in | Wt 105.0 lb

## 2023-01-26 DIAGNOSIS — Z86010 Personal history of colonic polyps: Secondary | ICD-10-CM

## 2023-01-26 MED ORDER — NA SULFATE-K SULFATE-MG SULF 17.5-3.13-1.6 GM/177ML PO SOLN
1.0000 | Freq: Once | ORAL | 0 refills | Status: AC
Start: 2023-01-26 — End: 2023-01-26

## 2023-01-26 NOTE — Progress Notes (Signed)
Pt's name and DOB verified at the beginning of the pre-visit.  Pt denies any difficulty with ambulating,sitting, laying down or rolling side to side Gave both [facility] main # and MD on call # prior to instructions.  No egg or soy allergy known to patient  No issues known to pt with past sedation with any surgeries or procedures Pt denies having issues being intubated Pt has no issues moving head neck or swallowing No FH of Malignant Hyperthermia Pt is not on diet pills Pt is not on home 02  Pt is not on blood thinners  Pt denies issues with constipation  Pt is not on dialysis Pt denise any abnormal heart rhythms  Pt denies any upcoming cardiac testing Pt encouraged to use to use Singlecare or Goodrx to reduce cost  Patient's chart reviewed by Cathlyn Parsons CNRA prior to pre-visit and patient appropriate for the [facility].  Pre-visit completed and red dot placed by patient's name on their procedure day (on provider's schedule).  . Visit by phone Pt states weight is 105lb Instructed pt why it is important to and  to call if they have any changes in health or new medications. Directed them to the # given and on instructions.   Pt states they will.  Instructions reviewed with pt and pt states understanding. Instructed to review again prior to procedure. Pt states they will.  Instructions sent by mail with coupon and by my chart

## 2023-02-01 DIAGNOSIS — G894 Chronic pain syndrome: Secondary | ICD-10-CM | POA: Diagnosis not present

## 2023-02-01 DIAGNOSIS — M461 Sacroiliitis, not elsewhere classified: Secondary | ICD-10-CM | POA: Diagnosis not present

## 2023-02-01 DIAGNOSIS — Z79891 Long term (current) use of opiate analgesic: Secondary | ICD-10-CM | POA: Diagnosis not present

## 2023-02-01 DIAGNOSIS — Z79899 Other long term (current) drug therapy: Secondary | ICD-10-CM | POA: Diagnosis not present

## 2023-02-04 DIAGNOSIS — H2513 Age-related nuclear cataract, bilateral: Secondary | ICD-10-CM | POA: Diagnosis not present

## 2023-02-06 DIAGNOSIS — Z23 Encounter for immunization: Secondary | ICD-10-CM | POA: Diagnosis not present

## 2023-02-10 ENCOUNTER — Ambulatory Visit (AMBULATORY_SURGERY_CENTER): Payer: Medicare Other | Admitting: Internal Medicine

## 2023-02-10 ENCOUNTER — Encounter: Payer: Self-pay | Admitting: Internal Medicine

## 2023-02-10 ENCOUNTER — Encounter: Payer: BLUE CROSS/BLUE SHIELD | Admitting: Internal Medicine

## 2023-02-10 VITALS — BP 100/66 | HR 56 | Temp 98.0°F | Resp 11 | Ht 63.0 in | Wt 105.0 lb

## 2023-02-10 DIAGNOSIS — K635 Polyp of colon: Secondary | ICD-10-CM | POA: Diagnosis not present

## 2023-02-10 DIAGNOSIS — D123 Benign neoplasm of transverse colon: Secondary | ICD-10-CM

## 2023-02-10 DIAGNOSIS — D128 Benign neoplasm of rectum: Secondary | ICD-10-CM | POA: Diagnosis not present

## 2023-02-10 DIAGNOSIS — Z09 Encounter for follow-up examination after completed treatment for conditions other than malignant neoplasm: Secondary | ICD-10-CM

## 2023-02-10 DIAGNOSIS — M1990 Unspecified osteoarthritis, unspecified site: Secondary | ICD-10-CM | POA: Diagnosis not present

## 2023-02-10 DIAGNOSIS — Z86010 Personal history of colonic polyps: Secondary | ICD-10-CM

## 2023-02-10 DIAGNOSIS — K621 Rectal polyp: Secondary | ICD-10-CM | POA: Diagnosis not present

## 2023-02-10 DIAGNOSIS — D120 Benign neoplasm of cecum: Secondary | ICD-10-CM | POA: Diagnosis not present

## 2023-02-10 DIAGNOSIS — D124 Benign neoplasm of descending colon: Secondary | ICD-10-CM

## 2023-02-10 DIAGNOSIS — I10 Essential (primary) hypertension: Secondary | ICD-10-CM | POA: Diagnosis not present

## 2023-02-10 DIAGNOSIS — D122 Benign neoplasm of ascending colon: Secondary | ICD-10-CM | POA: Diagnosis not present

## 2023-02-10 MED ORDER — SODIUM CHLORIDE 0.9 % IV SOLN: 500.0000 mL | Freq: Once | INTRAVENOUS | Status: DC

## 2023-02-10 NOTE — Progress Notes (Signed)
Report to PACU, RN, vss, BBS= Clear.  

## 2023-02-10 NOTE — Op Note (Signed)
[facility] Patient Name: Katelyn [name] Procedure Date: 02/10/2023 8:54 AM MRN: [id number] Endoscopist: Wilhemina Bonito. Marina Goodell , MD, [id number] Age: 77 Referring MD:  Date of Birth: 21-Jul-1945 Gender: Female Account #: [id number] Procedure:                Colonoscopy with cold snare polypectomy x 7; biopsy                            polypectomy x 1 Indications:              High risk colon cancer surveillance: Personal                            history of adenoma (10 mm or greater in size), High                            risk colon cancer surveillance: Personal history of                            multiple (3 or more) adenomas. Multiple previous                            exams [place] and [place]. 2012, 2015,                            2018, 2021. Father with colon cancer around age 5.                            NOTE. Had post hemorrhoidal banding bleeding                            requiring therapeutic January 14, 2023 Medicines:                Monitored Anesthesia Care Procedure:                Pre-Anesthesia Assessment:                           - Prior to the procedure, a History and Physical                            was performed, and patient medications and                            allergies were reviewed. The patient's tolerance of                            previous anesthesia was also reviewed. The risks                            and benefits of the procedure and the sedation                            options and risks were discussed with the patient.  All questions were answered, and informed consent                            was obtained. Prior Anticoagulants: The patient has                            taken no anticoagulant or antiplatelet agents.                            After reviewing the risks and benefits, the patient                            was deemed in satisfactory condition to undergo the                             procedure.                           After obtaining informed consent, the colonoscope                            was passed under direct vision. Throughout the                            procedure, the patient's blood pressure, pulse, and                            oxygen saturations were monitored continuously. The                            Olympus Scope [id number] was introduced through the                            anus and advanced to the the cecum, identified by                            appendiceal orifice and ileocecal valve. The                            ileocecal valve, appendiceal orifice, and rectum                            were photographed. The quality of the bowel                            preparation was excellent. The colonoscopy was                            performed without difficulty. The patient tolerated                            the procedure well. The bowel preparation used was  SUPREP via split dose instruction. Scope In: 9:04:40 AM Scope Out: 9:33:13 AM Scope Withdrawal Time: 0 hours 22 minutes 58 seconds  Total Procedure Duration: 0 hours 28 minutes 33 seconds  Findings:                 Seven polyps were found in the descending colon,                            transverse colon, ascending colon and cecum. The                            polyps were 3 to 8 mm in size. These polyps were                            removed with a cold snare. Resection and retrieval                            were complete.                           A 2 mm polyp was found in the rectum. The polyp was                            removed with a jumbo cold forceps. Resection and                            retrieval were complete.                           A few diverticula were found in the left colon.                           Internal hemorrhoids were found during                            retroflexion. Scarring from prior banding noted. As                             well, a single Endo Clip was present from recent                            therapeutics. Complications:            No immediate complications. Estimated blood loss:                            None. Estimated Blood Loss:     Estimated blood loss: none. Impression:               - Seven 3 to 8 mm polyps in the descending colon,                            in the transverse colon, in the ascending colon and  in the cecum, removed with a cold snare. Resected                            and retrieved.                           - One 2 mm polyp in the rectum, removed with a                            jumbo cold forceps. Resected and retrieved.                           - Diverticulosis in the left colon.                           - Internal hemorrhoids. Recommendation:           - Repeat colonoscopy in 3 years for surveillance.                            PEDIATRIC SCOPE                           - Patient has a contact number available for                            emergencies. The signs and symptoms of potential                            delayed complications were discussed with the                            patient. Return to normal activities tomorrow.                            Written discharge instructions were provided to the                            patient.                           - Resume previous diet.                           - Continue present medications.                           - Await pathology results. Wilhemina Bonito. Marina Goodell, MD 02/10/2023 9:43:57 AM This report has been signed electronically.

## 2023-02-10 NOTE — Progress Notes (Signed)
VS by CW  Pt's states no medical or surgical changes since previsit or office visit.  

## 2023-02-10 NOTE — Progress Notes (Signed)
HISTORY OF PRESENT ILLNESS:  Katelyn [name] is a 77 y.o. female with a history of multiple and advanced adenomatous polyps.  Now for surveillance.  Recently underwent hemorrhoidal banding complicated by post hemorrhoidal bleeding  REVIEW OF SYSTEMS:  All non-GI ROS negative except for  Past Medical History:  Diagnosis Date   Anxiety    Arthritis    cervical and lumbar DDD   Heart murmur    Hyperlipidemia    Hypertension    Migraines    Due to neck pain   Osteoporosis     Past Surgical History:  Procedure Laterality Date   BUNIONECTOMY     right   CERVICAL FUSION     x3   COLONOSCOPY     FLEXIBLE SIGMOIDOSCOPY N/A 01/14/2023   Procedure: FLEXIBLE SIGMOIDOSCOPY;  Surgeon: Meryl Dare, MD;  Location: [facility];  Service: Gastroenterology;  Laterality: N/A;   HEMOSTASIS CLIP PLACEMENT  01/14/2023   Procedure: HEMOSTASIS CLIP PLACEMENT;  Surgeon: Meryl Dare, MD;  Location: [facility];  Service: Gastroenterology;;   LUMBAR DISC SURGERY     POLYPECTOMY     radiofrequency ablasion     SPINE SURGERY      Dr. Noel Gerold (x2, last Nov/15) C3-T4   UPPER GASTROINTESTINAL ENDOSCOPY      Social History Siany Sorce  reports that she has never smoked. She has never used smokeless tobacco. She reports current alcohol use of about 7.0 standard drinks of alcohol per week. She reports that she does not use drugs.  family history includes Arthritis in her father, maternal grandfather, maternal grandmother, mother, paternal grandfather, and paternal grandmother; Colon polyps in her father and mother; Hearing loss in her mother; Heart disease in her father and mother; Hyperlipidemia in her father and mother; Hypertension in her mother; Stroke in her mother.  Allergies  Allergen Reactions   Iodine Hives   Prednisone Hives    Pt took Benadryl with Prednisone for surgery and did ok with it   Sulfa Antibiotics Swelling   Red Dye #40 (Allura Red) Rash       PHYSICAL  EXAMINATION: Vital signs: BP 105/76   Pulse 68   Temp 98 F (36.7 C) (Skin)   Ht 5\' 3"  (1.6 m)   Wt 105 lb (47.6 kg)   SpO2 95%   BMI 18.60 kg/m  General: Well-developed, well-nourished, no acute distress HEENT: Sclerae are anicteric, conjunctiva pink. Oral mucosa intact Lungs: Clear Heart: Regular Abdomen: soft, nontender, nondistended, no obvious ascites, no peritoneal signs, normal bowel sounds. No organomegaly. Extremities: No edema Psychiatric: alert and oriented x3. Cooperative     ASSESSMENT:  Personal history of multiple advanced adenomas   PLAN:   Surveillance colonoscopy

## 2023-02-10 NOTE — Patient Instructions (Signed)
Handouts Provided:  Polyps and Diverticulosis  REPEAT Colonoscopy in 3 years for surveillance.  YOU HAD AN ENDOSCOPIC PROCEDURE TODAY AT THE [facility]:   Refer to the procedure report that was given to you for any specific questions about what was found during the examination.  If the procedure report does not answer your questions, please call your gastroenterologist to clarify.  If you requested that your care partner not be given the details of your procedure findings, then the procedure report has been included in a sealed envelope for you to review at your convenience later.  YOU SHOULD EXPECT: Some feelings of bloating in the abdomen. Passage of more gas than usual.  Walking can help get rid of the air that was put into your GI tract during the procedure and reduce the bloating. If you had a lower endoscopy (such as a colonoscopy or flexible sigmoidoscopy) you may notice spotting of blood in your stool or on the toilet paper. If you underwent a bowel prep for your procedure, you may not have a normal bowel movement for a few days.  Please Note:  You might notice some irritation and congestion in your nose or some drainage.  This is from the oxygen used during your procedure.  There is no need for concern and it should clear up in a day or so.  SYMPTOMS TO REPORT IMMEDIATELY:  Following lower endoscopy (colonoscopy or flexible sigmoidoscopy):  Excessive amounts of blood in the stool  Significant tenderness or worsening of abdominal pains  Swelling of the abdomen that is new, acute  Fever of 100F or higher  For urgent or emergent issues, a gastroenterologist can be reached at any hour by calling [phone]. Do not use MyChart messaging for urgent concerns.    DIET:  We do recommend a small meal at first, but then you may proceed to your regular diet.  Drink plenty of fluids but you should avoid alcoholic beverages for 24 hours.  ACTIVITY:  You should plan to take it  easy for the rest of today and you should NOT DRIVE or use heavy machinery until tomorrow (because of the sedation medicines used during the test).    FOLLOW UP: Our staff will call the number listed on your records the next business day following your procedure.  We will call around 7:15- 8:00 am to check on you and address any questions or concerns that you may have regarding the information given to you following your procedure. If we do not reach you, we will leave a message.     If any biopsies were taken you will be contacted by phone or by letter within the next 1-3 weeks.  Please call us at [phone] if you have not heard about the biopsies in 3 weeks.    SIGNATURES/CONFIDENTIALITY: You and/or your care partner have signed paperwork which will be entered into your electronic medical record.  These signatures attest to the fact that that the information above on your After Visit Summary has been reviewed and is understood.  Full responsibility of the confidentiality of this discharge information lies with you and/or your care-partner.

## 2023-02-10 NOTE — Progress Notes (Signed)
Called to room to assist during endoscopic procedure.  Patient ID and intended procedure confirmed with present staff. Received instructions for my participation in the procedure from the performing physician.  

## 2023-02-11 ENCOUNTER — Telehealth: Payer: Self-pay

## 2023-02-11 NOTE — Telephone Encounter (Signed)
  Follow up Call-     02/10/2023    8:27 AM  Call back number  Post procedure Call Back phone  # [phone]  Permission to leave phone message Yes     Patient questions:  Do you have a fever, pain , or abdominal swelling? No. Pain Score  0 *  Have you tolerated food without any problems? Yes.    Have you been able to return to your normal activities? Yes.    Do you have any questions about your discharge instructions: Diet   No. Medications  No. Follow up visit  No.  Do you have questions or concerns about your Care? No.  Actions: * If pain score is 4 or above: No action needed, pain <4.

## 2023-02-15 ENCOUNTER — Encounter: Payer: Self-pay | Admitting: Internal Medicine

## 2023-02-15 DIAGNOSIS — M461 Sacroiliitis, not elsewhere classified: Secondary | ICD-10-CM | POA: Diagnosis not present

## 2023-02-15 DIAGNOSIS — M5416 Radiculopathy, lumbar region: Secondary | ICD-10-CM | POA: Diagnosis not present

## 2023-02-19 ENCOUNTER — Ambulatory Visit: Payer: Medicare Other | Admitting: Family Medicine

## 2023-02-19 VITALS — BP 116/72 | HR 83 | Temp 97.5°F | Ht 63.0 in | Wt 106.4 lb

## 2023-02-19 DIAGNOSIS — M810 Age-related osteoporosis without current pathological fracture: Secondary | ICD-10-CM | POA: Diagnosis not present

## 2023-02-19 DIAGNOSIS — G894 Chronic pain syndrome: Secondary | ICD-10-CM

## 2023-02-19 DIAGNOSIS — Z981 Arthrodesis status: Secondary | ICD-10-CM | POA: Diagnosis not present

## 2023-02-19 DIAGNOSIS — E785 Hyperlipidemia, unspecified: Secondary | ICD-10-CM | POA: Diagnosis not present

## 2023-02-19 DIAGNOSIS — Z0000 Encounter for general adult medical examination without abnormal findings: Secondary | ICD-10-CM

## 2023-02-19 DIAGNOSIS — Z13220 Encounter for screening for lipoid disorders: Secondary | ICD-10-CM

## 2023-02-19 DIAGNOSIS — Z0001 Encounter for general adult medical examination with abnormal findings: Secondary | ICD-10-CM | POA: Diagnosis not present

## 2023-02-19 DIAGNOSIS — I10 Essential (primary) hypertension: Secondary | ICD-10-CM | POA: Diagnosis not present

## 2023-02-19 DIAGNOSIS — M5136 Other intervertebral disc degeneration, lumbar region: Secondary | ICD-10-CM

## 2023-02-19 NOTE — Progress Notes (Signed)
Subjective:    Patient ID: Katelyn [name], female    DOB: 06/03/1946, 77 y.o.   MRN: [id number]   Patient is here today for complete physical exam.  Patient continues to deal with severe low back pain.  Unfortunately she may need 1 more surgery on her lumbar spine.  She is continue to deal with low back pain and right-sided sciatica.  She uses tramadol sparingly as needed for pain.  Otherwise she is doing well.  She denies any falls.  She denies any balance issues.  She denies any memory loss.  Her immunizations are up-to-date except for a COVID booster and an RSV vaccine.  She states that she had a flu shot in August.  The remainder of her vaccinations are up-to-date that showN: Immunization History  Administered Date(s) Administered   Fluad Quad(high Dose 65+) 03/21/2022   Influenza, High Dose Seasonal PF 02/02/2019, 02/06/2023   PFIZER Comirnaty(Gray Top)Covid-19 Tri-Sucrose Vaccine 08/14/2019, 09/04/2019, 03/19/2020   PFIZER(Purple Top)SARS-COV-2 Vaccination 08/14/2019, 09/04/2019, 03/19/2020   PNEUMOCOCCAL CONJUGATE-20 07/29/2020   Pneumococcal Conjugate-13 10/17/2015   Pneumococcal Polysaccharide-23 09/24/2014   Unspecified SARS-COV-2 Vaccination 08/14/2019, 03/21/2022   Zoster Recombinant(Shingrix) 07/29/2020   Zoster, Live 06/21/2013   Colonoscopy was performed earlier this year and showed 7 polyps some of which were tubular adenomas.  They recommended repeat colonoscopy in 2027.  Her mammogram was performed in the April was normal.  She has a history of osteoporosis.  She been on Fosamax for more than 5 years.  Her last bone density test was in 2021. Past Medical History:  Diagnosis Date   Anxiety    Arthritis    cervical and lumbar DDD   Heart murmur    Hyperlipidemia    Hypertension    Migraines    Due to neck pain   Osteoporosis    Past Surgical History:  Procedure Laterality Date   BUNIONECTOMY     right   CERVICAL FUSION     x3   COLONOSCOPY     FLEXIBLE  SIGMOIDOSCOPY N/A 01/14/2023   Procedure: FLEXIBLE SIGMOIDOSCOPY;  Surgeon: Meryl Dare, MD;  Location: [facility];  Service: Gastroenterology;  Laterality: N/A;   HEMOSTASIS CLIP PLACEMENT  01/14/2023   Procedure: HEMOSTASIS CLIP PLACEMENT;  Surgeon: Meryl Dare, MD;  Location: [facility];  Service: Gastroenterology;;   LUMBAR DISC SURGERY     POLYPECTOMY     radiofrequency ablasion     SPINE SURGERY      Dr. Noel Gerold (x2, last Nov/15) C3-T4   UPPER GASTROINTESTINAL ENDOSCOPY     Current Outpatient Medications on File Prior to Visit  Medication Sig Dispense Refill   alendronate (FOSAMAX) 70 MG tablet TAKE 1 TABLET BY MOUTH EVERY 7 DAYS WITH A FULL GLASS OF WATER ON EMPTY STOMACH 12 tablet 3   ALPRAZolam (XANAX) 0.5 MG tablet TAKE 1 TABLET BY MOUTH THREE TIMES A DAY AS NEEDED FOR ANXIETY 90 tablet 0   Ascorbic Acid (VITAMIN C) 500 MG tablet Take 500 mg by mouth daily.       aspirin 81 MG tablet Take 81 mg by mouth daily.       B Complex Vitamins (VITAMIN B COMPLEX PO) Take 1 tablet by mouth daily.       calcium carbonate (OS-CAL) 600 MG TABS Take 600 mg by mouth 2 (two) times daily with a meal.       diclofenac sodium (VOLTAREN) 1 % GEL Apply 2 g topically 4 (four) times daily.  DULoxetine (CYMBALTA) 30 MG capsule Take 1 capsule (30 mg total) by mouth 2 (two) times daily. 180 capsule 3   fish oil-omega-3 fatty acids 1000 MG capsule Take 1 g by mouth 2 (two) times daily.       Garlic 1000 MG CAPS Take 1 capsule by mouth daily.       hydrochlorothiazide (HYDRODIURIL) 25 MG tablet TAKE 1 TABLET (25 MG TOTAL) BY MOUTH DAILY. 90 tablet 0   HYDROcodone-acetaminophen (NORCO/VICODIN) 5-325 MG tablet      Lysine 500 MG CAPS Take 1 capsule by mouth daily.       magnesium oxide (MAG-OX) 400 MG tablet Take 400 mg by mouth daily.       Multiple Vitamins-Minerals (MULTIVITAMIN WITH MINERALS) tablet Take 1 tablet by mouth daily.       nortriptyline (PAMELOR) 10 MG capsule Take 10 mg by  mouth See admin instructions. Take two capsules by mouth (20 mg) every morning and three capsules by mouth (30 mg) every evening.     POTASSIUM GLUCONATE PO Take 1 tablet by mouth daily.       PRESCRIPTION MEDICATION Trigger point injections.  Once every 3 months.     simvastatin (ZOCOR) 20 MG tablet TAKE 1 TABLET BY MOUTH EVERYDAY AT BEDTIME 90 tablet 0   SUMAtriptan (IMITREX) 100 MG tablet MAY REPEAT IN 2 HOURS IF HEADACHE PERSISTS OR RECURS. 12 tablet 0   traMADol (ULTRAM) 50 MG tablet Take 50 mg by mouth every 8 (eight) hours as needed. for pain  0   vitamin E 400 UNIT capsule Take 400 Units by mouth daily.       zolpidem (AMBIEN) 5 MG tablet TAKE 1 TABLET BY MOUTH AT BEDTIME AS NEEDED FOR SLEEP (Patient taking differently: Take 5 mg by mouth at bedtime as needed for sleep.) 30 tablet 3   No current facility-administered medications on file prior to visit.   Allergies  Allergen Reactions   Iodine Hives   Prednisone Hives    Pt took Benadryl with Prednisone for surgery and did ok with it   Sulfa Antibiotics Swelling   Red Dye #40 (Allura Red) Rash   Social History   Socioeconomic History   Marital status: Married    Spouse name: Jomarie Longs   Number of children: 2   Years of education: [employer] education level: Associate degree: academic program  Occupational History   Occupation: Retired  Tobacco Use   Smoking status: Never   Smokeless tobacco: Never  Vaping Use   Vaping status: Never Used  Substance and Sexual Activity   Alcohol use: Yes    Alcohol/week: 7.0 standard drinks of alcohol    Types: 7 Glasses of wine per week    Comment: 1-2 rare   Drug use: No   Sexual activity: Yes  Other Topics Concern   Not on file  Social History Narrative   Lives with husband   Caffeine use: none   Social Determinants of Health   Financial Resource Strain: Low Risk  (10/16/2022)   Overall Financial Resource Strain (CARDIA)    Difficulty of Paying Living Expenses: Not hard  at all  Food Insecurity: No Food Insecurity (10/16/2022)   Hunger Vital Sign    Worried About Running Out of Food in the Last Year: Never true    Ran Out of Food in the Last Year: Never true  Transportation Needs: No Transportation Needs (10/16/2022)   PRAPARE - Administrator, Civil Service (Medical):  No    Lack of Transportation (Non-Medical): No  Physical Activity: Insufficiently Active (10/16/2022)   Exercise Vital Sign    Days of Exercise per Week: 4 days    Minutes of Exercise per Session: 30 min  Stress: No Stress Concern Present (10/16/2022)   [employer] of Occupational Health - Occupational Stress Questionnaire    Feeling of Stress : Not at all  Social Connections: Moderately Integrated (10/16/2022)   Social Connection and Isolation Panel [NHANES]    Frequency of Communication with Friends and Family: More than three times a week    Frequency of Social Gatherings with Friends and Family: Three times a week    Attends Religious Services: More than 4 times per year    Active Member of Clubs or Organizations: No    Attends Banker Meetings: Not on file    Marital Status: Married  Intimate Partner Violence: Unknown (09/24/2021)   Received from [employer]   HITS    Physically Hurt: Not on file    Insult or Talk Down To: Not on file    Threaten Physical Harm: Not on file    Scream or Curse: Not on file   Family History  Problem Relation Age of Onset   Colon polyps Mother    Arthritis Mother    Hearing loss Mother    Hyperlipidemia Mother    Hypertension Mother    Stroke Mother    Heart disease Mother    Colon polyps Father    Arthritis Father    Heart disease Father    Hyperlipidemia Father    Arthritis Maternal Grandmother    Arthritis Maternal Grandfather    Arthritis Paternal Grandmother    Arthritis Paternal Grandfather    Colon cancer Neg Hx    Esophageal cancer Neg Hx    Stomach cancer Neg Hx    Rectal cancer Neg  Hx       Review of Systems  All other systems reviewed and are negative.      Objective:   Physical Exam Vitals reviewed.  Constitutional:      General: She is not in acute distress.    Appearance: She is well-developed. She is not diaphoretic.  HENT:     Head: Normocephalic and atraumatic.     Right Ear: External ear normal.     Left Ear: External ear normal.     Nose: Nose normal.     Mouth/Throat:     Pharynx: No oropharyngeal exudate.  Eyes:     General: No scleral icterus.       Right eye: No discharge.        Left eye: No discharge.     Conjunctiva/sclera: Conjunctivae normal.     Pupils: Pupils are equal, round, and reactive to light.  Neck:     Thyroid: No thyromegaly.     Vascular: No JVD.     Trachea: No tracheal deviation.  Cardiovascular:     Rate and Rhythm: Normal rate and regular rhythm.     Heart sounds: No murmur heard.    No friction rub. No gallop.  Pulmonary:     Effort: Pulmonary effort is normal. No respiratory distress.     Breath sounds: Normal breath sounds. No stridor. No wheezing or rales.  Chest:     Chest wall: No tenderness.  Abdominal:     General: Bowel sounds are normal. There is no distension.     Palpations: Abdomen is soft. There is  no mass.     Tenderness: There is no abdominal tenderness. There is no guarding or rebound.  Musculoskeletal:        General: No tenderness. Normal range of motion.     Cervical back: Normal range of motion and neck supple.  Lymphadenopathy:     Cervical: No cervical adenopathy.  Skin:    General: Skin is warm.     Coloration: Skin is not pale.     Findings: No erythema or rash.  Neurological:     Mental Status: She is alert and oriented to person, place, and time.     Cranial Nerves: No cranial nerve deficit.     Motor: No abnormal muscle tone.     Coordination: Coordination normal.     Deep Tendon Reflexes: Reflexes are normal and symmetric.  Psychiatric:        Behavior: Behavior normal.         Thought Content: Thought content normal.        Judgment: Judgment normal.           Assessment & Plan:  Osteoporosis without current pathological fracture, unspecified osteoporosis type - Plan: DG Bone Density  Screening cholesterol level - Plan: Hepatic Function Panel, Lipid panel  Routine general medical examination at a health care facility  Chronic pain syndrome  History of fusion of cervical spine  DDD (degenerative disc disease), lumbar Patient has been on Fosamax for more than 5 years.  Therefore I recommend a medication holiday.  Repeat bone density test every 2 years and if that bone density begins to decline we will resume Fosamax.  Continue calcium and vitamin D at the present time.  Mammogram and colonoscopy are up-to-date.  Recommended a COVID booster and we also discussed the RSV shot.  I will check fasting lipid panel and liver function test.  CBC and CMP were checked in late July and were completely normal.

## 2023-02-20 LAB — LIPID PANEL
Cholesterol: 240 mg/dL — ABNORMAL HIGH (ref <200)
HDL: 122 mg/dL (ref >=50)
LDL Cholesterol (Calc): 104 mg/dL — ABNORMAL HIGH
Non-HDL Cholesterol (Calc): 118 mg/dL (ref <130)
Total CHOL/HDL Ratio: 2 (calc) (ref <5.0)
Triglycerides: 46 mg/dL (ref <150)

## 2023-02-20 LAB — HEPATIC FUNCTION PANEL
AG Ratio: 1.9 (calc) (ref 1.0–2.5)
ALT: 30 U/L — ABNORMAL HIGH (ref 6–29)
AST: 27 U/L (ref 10–35)
Albumin: 4.1 g/dL (ref 3.6–5.1)
Alkaline phosphatase (APISO): 61 U/L (ref 37–153)
Bilirubin, Direct: 0.1 mg/dL (ref 0.0–0.2)
Globulin: 2.2 g/dL (ref 1.9–3.7)
Indirect Bilirubin: 0.3 mg/dL (ref 0.2–1.2)
Total Bilirubin: 0.4 mg/dL (ref 0.2–1.2)
Total Protein: 6.3 g/dL (ref 6.1–8.1)

## 2023-03-01 ENCOUNTER — Other Ambulatory Visit: Payer: Self-pay | Admitting: Family Medicine

## 2023-03-03 NOTE — Telephone Encounter (Signed)
Requested Prescriptions  Pending Prescriptions Disp Refills   SUMAtriptan (IMITREX) 100 MG tablet [Pharmacy Med Name: SUMATRIPTAN SUCC 100 MG TABLET] 12 tablet 0    Sig: MAY REPEAT IN 2 HOURS IF HEADACHE PERSISTS OR RECURS.     Neurology:  Migraine Therapy - Triptan Failed - 03/01/2023  7:43 PM      Failed - Valid encounter within last 12 months    Recent Outpatient Visits           1 year ago Fatigue, unspecified type   [facility] Medicine Pickard, Priscille Heidelberg, MD   2 years ago Routine general medical examination at a health care facility   [facility] Medicine Pickard, Priscille Heidelberg, MD   3 years ago Pure hypercholesterolemia   [facility] Tanya Nones, Priscille Heidelberg, MD   3 years ago UTI symptoms   [facility] Medicine Lawson Fiscal A, FNP   4 years ago Osteoporosis without current pathological fracture, unspecified osteoporosis type   [facility] Medicine Pickard, Priscille Heidelberg, MD              Passed - Last BP in normal range    BP Readings from Last 1 Encounters:  02/19/23 116/72          ALPRAZolam (XANAX) 0.5 MG tablet [Pharmacy Med Name: ALPRAZOLAM 0.5 MG TABLET] 90 tablet 0    Sig: TAKE 1 TABLET BY MOUTH THREE TIMES A DAY AS NEEDED FOR ANXIETY     Not Delegated - Psychiatry: Anxiolytics/Hypnotics 2 Failed - 03/01/2023  7:43 PM      Failed - This refill cannot be delegated      Failed - Urine Drug Screen completed in last 360 days      Failed - Valid encounter within last 6 months    Recent Outpatient Visits           1 year ago Fatigue, unspecified type   [facility] Donita Brooks, MD   2 years ago Routine general medical examination at a health care facility   [facility] Medicine Pickard, Priscille Heidelberg, MD   3 years ago Pure hypercholesterolemia   [facility] Tanya Nones, Priscille Heidelberg, MD   3 years ago UTI symptoms   [facility] Medicine Lawson Fiscal A, FNP   4 years ago Osteoporosis  without current pathological fracture, unspecified osteoporosis type   [facility] Medicine Pickard, Priscille Heidelberg, MD              Passed - Patient is not pregnant

## 2023-03-03 NOTE — Telephone Encounter (Signed)
Patient called to follow up on refill requested for ALPRAZolam Prudy Feeler) 0.5 MG tablet   LOV: 02/19/23 (cpe)  Pharmacy confirmed as:   [id number] [place], [place] - [address] [place] [address] Phone: [phone]  Fax: [phone] DEA #: [id number]   Please advise at [phone].

## 2023-03-03 NOTE — Telephone Encounter (Signed)
Requested medication (s) are due for refill today: routing for approval  Requested medication (s) are on the active medication list: yes  Last refill:  01/25/23  Future visit scheduled: no  Notes to clinic:  Unable to refill per protocol, cannot delegate.      Requested Prescriptions  Pending Prescriptions Disp Refills   ALPRAZolam (XANAX) 0.5 MG tablet [Pharmacy Med Name: ALPRAZOLAM 0.5 MG TABLET] 90 tablet 0    Sig: TAKE 1 TABLET BY MOUTH THREE TIMES A DAY AS NEEDED FOR ANXIETY     Not Delegated - Psychiatry: Anxiolytics/Hypnotics 2 Failed - 03/01/2023  7:43 PM      Failed - This refill cannot be delegated      Failed - Urine Drug Screen completed in last 360 days      Failed - Valid encounter within last 6 months    Recent Outpatient Visits           1 year ago Fatigue, unspecified type   [facility] Medicine Donita Brooks, MD   2 years ago Routine general medical examination at a health care facility   [facility] Medicine Pickard, Priscille Heidelberg, MD   3 years ago Pure hypercholesterolemia   [facility] Tanya Nones, Priscille Heidelberg, MD   3 years ago UTI symptoms   [facility] Medicine Lawson Fiscal A, FNP   4 years ago Osteoporosis without current pathological fracture, unspecified osteoporosis type   [facility] Medicine Pickard, Priscille Heidelberg, MD              Passed - Patient is not pregnant      Signed Prescriptions Disp Refills   SUMAtriptan (IMITREX) 100 MG tablet 12 tablet 0    Sig: MAY REPEAT IN 2 HOURS IF HEADACHE PERSISTS OR RECURS.     Neurology:  Migraine Therapy - Triptan Failed - 03/01/2023  7:43 PM      Failed - Valid encounter within last 12 months    Recent Outpatient Visits           1 year ago Fatigue, unspecified type   [facility] Medicine Tanya Nones, Priscille Heidelberg, MD   2 years ago Routine general medical examination at a health care facility   [facility] Medicine Pickard, Priscille Heidelberg, MD   3 years  ago Pure hypercholesterolemia   [facility] Tanya Nones, Priscille Heidelberg, MD   3 years ago UTI symptoms   [facility] Medicine Lawson Fiscal A, FNP   4 years ago Osteoporosis without current pathological fracture, unspecified osteoporosis type   [facility] Medicine Pickard, Priscille Heidelberg, MD              Passed - Last BP in normal range    BP Readings from Last 1 Encounters:  02/19/23 116/72

## 2023-03-04 DIAGNOSIS — M4322 Fusion of spine, cervical region: Secondary | ICD-10-CM | POA: Diagnosis not present

## 2023-03-04 DIAGNOSIS — M461 Sacroiliitis, not elsewhere classified: Secondary | ICD-10-CM | POA: Diagnosis not present

## 2023-03-04 DIAGNOSIS — M791 Myalgia, unspecified site: Secondary | ICD-10-CM | POA: Diagnosis not present

## 2023-03-04 DIAGNOSIS — Z681 Body mass index (BMI) 19 or less, adult: Secondary | ICD-10-CM | POA: Diagnosis not present

## 2023-03-11 DIAGNOSIS — M47896 Other spondylosis, lumbar region: Secondary | ICD-10-CM | POA: Diagnosis not present

## 2023-03-11 DIAGNOSIS — M461 Sacroiliitis, not elsewhere classified: Secondary | ICD-10-CM | POA: Diagnosis not present

## 2023-03-11 DIAGNOSIS — M4326 Fusion of spine, lumbar region: Secondary | ICD-10-CM | POA: Diagnosis not present

## 2023-03-26 DIAGNOSIS — M4326 Fusion of spine, lumbar region: Secondary | ICD-10-CM | POA: Diagnosis not present

## 2023-03-26 DIAGNOSIS — M461 Sacroiliitis, not elsewhere classified: Secondary | ICD-10-CM | POA: Diagnosis not present

## 2023-03-29 ENCOUNTER — Other Ambulatory Visit: Payer: Self-pay | Admitting: Family Medicine

## 2023-03-29 DIAGNOSIS — I440 Atrioventricular block, first degree: Secondary | ICD-10-CM | POA: Diagnosis not present

## 2023-03-29 DIAGNOSIS — Z9889 Other specified postprocedural states: Secondary | ICD-10-CM | POA: Diagnosis not present

## 2023-03-29 DIAGNOSIS — Z1231 Encounter for screening mammogram for malignant neoplasm of breast: Secondary | ICD-10-CM

## 2023-03-29 DIAGNOSIS — M4326 Fusion of spine, lumbar region: Secondary | ICD-10-CM | POA: Diagnosis not present

## 2023-03-29 DIAGNOSIS — Z7982 Long term (current) use of aspirin: Secondary | ICD-10-CM | POA: Diagnosis not present

## 2023-03-29 DIAGNOSIS — M461 Sacroiliitis, not elsewhere classified: Secondary | ICD-10-CM | POA: Diagnosis present

## 2023-03-29 DIAGNOSIS — Z7983 Long term (current) use of bisphosphonates: Secondary | ICD-10-CM | POA: Diagnosis not present

## 2023-03-29 DIAGNOSIS — I4510 Unspecified right bundle-branch block: Secondary | ICD-10-CM | POA: Diagnosis not present

## 2023-03-29 DIAGNOSIS — I10 Essential (primary) hypertension: Secondary | ICD-10-CM | POA: Diagnosis present

## 2023-03-29 DIAGNOSIS — Z01810 Encounter for preprocedural cardiovascular examination: Secondary | ICD-10-CM | POA: Diagnosis not present

## 2023-03-29 DIAGNOSIS — M419 Scoliosis, unspecified: Secondary | ICD-10-CM | POA: Diagnosis not present

## 2023-03-29 DIAGNOSIS — Z981 Arthrodesis status: Secondary | ICD-10-CM | POA: Diagnosis not present

## 2023-03-29 DIAGNOSIS — M533 Sacrococcygeal disorders, not elsewhere classified: Secondary | ICD-10-CM | POA: Diagnosis present

## 2023-03-30 DIAGNOSIS — Z981 Arthrodesis status: Secondary | ICD-10-CM | POA: Diagnosis not present

## 2023-03-30 DIAGNOSIS — I10 Essential (primary) hypertension: Secondary | ICD-10-CM | POA: Diagnosis present

## 2023-03-30 DIAGNOSIS — M533 Sacrococcygeal disorders, not elsewhere classified: Secondary | ICD-10-CM | POA: Diagnosis present

## 2023-03-30 DIAGNOSIS — Z9889 Other specified postprocedural states: Secondary | ICD-10-CM | POA: Diagnosis not present

## 2023-03-30 DIAGNOSIS — M419 Scoliosis, unspecified: Secondary | ICD-10-CM | POA: Diagnosis not present

## 2023-03-30 DIAGNOSIS — I4510 Unspecified right bundle-branch block: Secondary | ICD-10-CM | POA: Diagnosis not present

## 2023-03-30 DIAGNOSIS — Z7982 Long term (current) use of aspirin: Secondary | ICD-10-CM | POA: Diagnosis not present

## 2023-03-30 DIAGNOSIS — M4326 Fusion of spine, lumbar region: Secondary | ICD-10-CM | POA: Diagnosis not present

## 2023-03-30 DIAGNOSIS — Z01810 Encounter for preprocedural cardiovascular examination: Secondary | ICD-10-CM | POA: Diagnosis not present

## 2023-03-30 DIAGNOSIS — M461 Sacroiliitis, not elsewhere classified: Secondary | ICD-10-CM | POA: Diagnosis present

## 2023-03-30 DIAGNOSIS — Z7983 Long term (current) use of bisphosphonates: Secondary | ICD-10-CM | POA: Diagnosis not present

## 2023-03-30 DIAGNOSIS — I440 Atrioventricular block, first degree: Secondary | ICD-10-CM | POA: Diagnosis not present

## 2023-03-31 DIAGNOSIS — R269 Unspecified abnormalities of gait and mobility: Secondary | ICD-10-CM | POA: Insufficient documentation

## 2023-04-01 ENCOUNTER — Other Ambulatory Visit: Payer: Self-pay

## 2023-04-01 ENCOUNTER — Telehealth: Payer: Self-pay | Admitting: Family Medicine

## 2023-04-01 DIAGNOSIS — G43009 Migraine without aura, not intractable, without status migrainosus: Secondary | ICD-10-CM

## 2023-04-01 DIAGNOSIS — G894 Chronic pain syndrome: Secondary | ICD-10-CM

## 2023-04-01 MED ORDER — SUMATRIPTAN SUCCINATE 100 MG PO TABS: 100.0000 mg | ORAL_TABLET | ORAL | 1 refills | Status: DC | PRN

## 2023-04-01 NOTE — Telephone Encounter (Signed)
Prescription Request  04/01/2023  LOV: 02/19/2023  What is the name of the medication or equipment?   SUMAtriptan (IMITREX) 100 MG tablet   Have you contacted your pharmacy to request a refill? Yes   Which pharmacy would you like this sent to?  [id number] [place], [place] - [address] [place] [address] Phone: [phone] Fax: [phone]    Patient notified that their request is being sent to the clinical staff for review and that they should receive a response within 2 business days.   Please advise pharmacist.

## 2023-04-07 ENCOUNTER — Other Ambulatory Visit: Payer: Self-pay

## 2023-04-07 ENCOUNTER — Other Ambulatory Visit: Payer: Self-pay | Admitting: Family Medicine

## 2023-04-07 MED ORDER — HYDROCHLOROTHIAZIDE 25 MG PO TABS: 25.0000 mg | ORAL_TABLET | Freq: Every day | ORAL | 2 refills | Status: DC

## 2023-04-07 NOTE — Telephone Encounter (Signed)
Prescription Request  04/07/2023  LOV: 02/19/23 cpe  What is the name of the medication or equipment? simvastatin (ZOCOR) 20 MG tablet [[id number]]  Have you contacted your pharmacy to request a refill? Yes   Which pharmacy would you like this sent to?  [id number] [place], [place] - [address] [place] [address] Phone: [phone] Fax: [phone]    Patient notified that their request is being sent to the clinical staff for review and that they should receive a response within 2 business days.   Please advise at [facility] [phone]

## 2023-04-07 NOTE — Telephone Encounter (Signed)
Prescription Request  04/07/2023  LOV: 02/19/23  What is the name of the medication or equipment? hydrochlorothiazide (HYDRODIURIL) 25 MG tablet [[id number]]  Have you contacted your pharmacy to request a refill? Yes   Which pharmacy would you like this sent to?  [id number] [place], [place] - [address] [place] [address] Phone: [phone] Fax: [phone]    Patient notified that their request is being sent to the clinical staff for review and that they should receive a response within 2 business days.   Please advise at [facility] [phone]

## 2023-04-07 NOTE — Telephone Encounter (Signed)
Requested Prescriptions  Pending Prescriptions Disp Refills   hydrochlorothiazide (HYDRODIURIL) 25 MG tablet 90 tablet 2    Sig: Take 1 tablet (25 mg total) by mouth daily.     Cardiovascular: Diuretics - Thiazide Failed - 04/07/2023  4:18 PM      Failed - Valid encounter within last 6 months    Recent Outpatient Visits           1 year ago Fatigue, unspecified type   [facility] Donita Brooks, MD   2 years ago Routine general medical examination at a health care facility   [facility] Medicine Pickard, Priscille Heidelberg, MD   3 years ago Pure hypercholesterolemia   [facility] Tanya Nones, Priscille Heidelberg, MD   3 years ago UTI symptoms   [facility] Medicine Elmore Guise, FNP   4 years ago Osteoporosis without current pathological fracture, unspecified osteoporosis type   [facility] Medicine Pickard, Priscille Heidelberg, MD              Passed - Cr in normal range and within 180 days    Creat  Date Value Ref Range Status  11/10/2021 0.63 0.60 - 1.00 mg/dL Final   Creatinine, Ser  Date Value Ref Range Status  01/14/2023 0.61 0.44 - 1.00 mg/dL Final         Passed - K in normal range and within 180 days    Potassium  Date Value Ref Range Status  01/14/2023 3.9 3.5 - 5.1 mmol/L Final         Passed - Na in normal range and within 180 days    Sodium  Date Value Ref Range Status  01/14/2023 137 135 - 145 mmol/L Final         Passed - Last BP in normal range    BP Readings from Last 1 Encounters:  02/19/23 116/72

## 2023-04-07 NOTE — Telephone Encounter (Signed)
Requested medication (s) are due for refill today: yes  Requested medication (s) are on the active medication list: yes    Last refill: 03/04/23  #90  0 refills  Future visit scheduled no  Notes to clinic:Not delegated, please review. Thank you.  Requested Prescriptions  Pending Prescriptions Disp Refills   ALPRAZolam (XANAX) 0.5 MG tablet [Pharmacy Med Name: ALPRAZOLAM 0.5 MG TABLET] 90 tablet 0    Sig: TAKE 1 TABLET BY MOUTH THREE TIMES A DAY AS NEEDED FOR ANXIETY     Not Delegated - Psychiatry: Anxiolytics/Hypnotics 2 Failed - 04/07/2023 12:52 PM      Failed - This refill cannot be delegated      Failed - Urine Drug Screen completed in last 360 days      Failed - Valid encounter within last 6 months    Recent Outpatient Visits           1 year ago Fatigue, unspecified type   [facility] Medicine Donita Brooks, MD   2 years ago Routine general medical examination at a health care facility   [facility] Medicine Pickard, Priscille Heidelberg, MD   3 years ago Pure hypercholesterolemia   [facility] Medicine Tanya Nones, Priscille Heidelberg, MD   3 years ago UTI symptoms   [facility] Medicine Lawson Fiscal A, FNP   4 years ago Osteoporosis without current pathological fracture, unspecified osteoporosis type   [facility] Medicine Pickard, Priscille Heidelberg, MD              Passed - Patient is not pregnant

## 2023-04-08 ENCOUNTER — Telehealth: Payer: Self-pay

## 2023-04-08 ENCOUNTER — Other Ambulatory Visit: Payer: Self-pay | Admitting: Family Medicine

## 2023-04-08 MED ORDER — ALPRAZOLAM 0.5 MG PO TABS: ORAL_TABLET | ORAL | 0 refills | Status: DC

## 2023-04-08 NOTE — Telephone Encounter (Signed)
Pt called in to request a refill of this med ALPRAZolam Prudy Feeler) 0.5 MG tablet [[id number]]  LOV: 10/16/22  PHARMACY: [id number] [place], [place] - [address]  [address] [place] [address]   CB#: [phone]

## 2023-04-13 ENCOUNTER — Telehealth: Payer: Self-pay

## 2023-04-13 ENCOUNTER — Other Ambulatory Visit: Payer: Self-pay

## 2023-04-13 ENCOUNTER — Other Ambulatory Visit: Payer: Self-pay | Admitting: Family Medicine

## 2023-04-13 DIAGNOSIS — E785 Hyperlipidemia, unspecified: Secondary | ICD-10-CM

## 2023-04-13 MED ORDER — SIMVASTATIN 20 MG PO TABS: 20.0000 mg | ORAL_TABLET | Freq: Every day | ORAL | 2 refills | Status: DC

## 2023-04-13 NOTE — Telephone Encounter (Signed)
Pt called in to request a new prescription refill of this med simvastatin (ZOCOR) 20 MG tablet [[id number]].   LOV: 02/19/23  PHARMACY: [id number] [place], [place] - [address] [place] [address] Phone: [phone]  Fax: [phone]   CB#: [phone]

## 2023-04-13 NOTE — Telephone Encounter (Signed)
Prescription Request  04/13/2023  LOV: 02/19/2023  What is the name of the medication or equipment?   simvastatin (ZOCOR) 20 MG tablet  **90 day script requested**  Have you contacted your pharmacy to request a refill? Yes   Which pharmacy would you like this sent to?  [id number] [place], [place] - [address] [place] [address] Phone: [phone] Fax: [phone]    Patient notified that their request is being sent to the clinical staff for review and that they should receive a response within 2 business days.   Please advise pharmacist.

## 2023-04-14 NOTE — Telephone Encounter (Signed)
Unable to refill per protocol, Rx request is too soon. Last refill 04/13/23.  Requested Prescriptions  Pending Prescriptions Disp Refills   simvastatin (ZOCOR) 20 MG tablet 90 tablet 0     Cardiovascular:  Antilipid - Statins Failed - 04/13/2023 10:03 AM      Failed - Valid encounter within last 12 months    Recent Outpatient Visits           1 year ago Fatigue, unspecified type   [facility] Pickard, Priscille Heidelberg, MD   2 years ago Routine general medical examination at a health care facility   [facility] Medicine Pickard, Priscille Heidelberg, MD   3 years ago Pure hypercholesterolemia   [facility] Tanya Nones, Priscille Heidelberg, MD   3 years ago UTI symptoms   [facility] Lawson Fiscal A, FNP   4 years ago Osteoporosis without current pathological fracture, unspecified osteoporosis type   [facility] Donita Brooks, MD              Failed - Lipid Panel in normal range within the last 12 months    Cholesterol  Date Value Ref Range Status  02/19/2023 240 (H) <200 mg/dL Final   LDL Cholesterol (Calc)  Date Value Ref Range Status  02/19/2023 104 (H) mg/dL (calc) Final    Comment:    Reference range: <100 . Desirable range <100 mg/dL for primary prevention;   <70 mg/dL for patients with CHD or diabetic patients  with > or = 2 CHD risk factors. Marland Kitchen LDL-C is now calculated using the Martin-Hopkins  calculation, which is a validated novel method providing  better accuracy than the Friedewald equation in the  estimation of LDL-C.  Horald Pollen et al. Lenox Ahr. 1610;960(45): 2061-2068  (http://education.[facility].com/faq/FAQ164)    HDL  Date Value Ref Range Status  02/19/2023 122 > OR = 50 mg/dL Final   Triglycerides  Date Value Ref Range Status  02/19/2023 46 <150 mg/dL Final         Passed - Patient is not pregnant

## 2023-04-29 DIAGNOSIS — M542 Cervicalgia: Secondary | ICD-10-CM | POA: Diagnosis not present

## 2023-04-29 DIAGNOSIS — M4326 Fusion of spine, lumbar region: Secondary | ICD-10-CM | POA: Diagnosis not present

## 2023-04-29 DIAGNOSIS — M4322 Fusion of spine, cervical region: Secondary | ICD-10-CM | POA: Diagnosis not present

## 2023-04-29 DIAGNOSIS — M461 Sacroiliitis, not elsewhere classified: Secondary | ICD-10-CM | POA: Diagnosis not present

## 2023-04-29 DIAGNOSIS — M791 Myalgia, unspecified site: Secondary | ICD-10-CM | POA: Diagnosis not present

## 2023-05-13 ENCOUNTER — Other Ambulatory Visit: Payer: Self-pay | Admitting: Family Medicine

## 2023-05-14 NOTE — Telephone Encounter (Signed)
Requested medications are due for refill today.  yes  Requested medications are on the active medications list.  yes  Last refill. 04/08/2023 #90 0 rf  Future visit scheduled.   no  Notes to clinic.  Refill not delegated.    Requested Prescriptions  Pending Prescriptions Disp Refills   ALPRAZolam (XANAX) 0.5 MG tablet [Pharmacy Med Name: ALPRAZOLAM 0.5 MG TABLET] 90 tablet 0    Sig: TAKE 1 TABLET BY MOUTH THREE TIMES A DAY AS NEEDED FOR ANXIETY     Not Delegated - Psychiatry: Anxiolytics/Hypnotics 2 Failed - 05/13/2023 11:34 AM      Failed - This refill cannot be delegated      Failed - Urine Drug Screen completed in last 360 days      Failed - Valid encounter within last 6 months    Recent Outpatient Visits           1 year ago Fatigue, unspecified type   [facility] Medicine Donita Brooks, MD   2 years ago Routine general medical examination at a health care facility   [facility] Pickard, Priscille Heidelberg, MD   3 years ago Pure hypercholesterolemia   [facility] Tanya Nones, Priscille Heidelberg, MD   3 years ago UTI symptoms   [facility] Medicine Lawson Fiscal A, FNP   4 years ago Osteoporosis without current pathological fracture, unspecified osteoporosis type   [facility] Medicine Pickard, Priscille Heidelberg, MD              Passed - Patient is not pregnant

## 2023-05-17 NOTE — Telephone Encounter (Signed)
Patient came to office to follow up on refill requested for ALPRAZolam Prudy Feeler) 0.5 MG tablet   LOV 02/19/23 (cpe).  Please advise patient.

## 2023-05-25 ENCOUNTER — Other Ambulatory Visit: Payer: Self-pay | Admitting: Family Medicine

## 2023-05-26 ENCOUNTER — Telehealth: Payer: Self-pay | Admitting: Internal Medicine

## 2023-05-26 NOTE — Telephone Encounter (Signed)
Pt reports she has internal hems and has been having rectal bleeding. She is not sure if the hems are bleeding or if she may have a rectal fissure. Pt scheduled to see Willette Cluster NP 06/02/23 at 10:30am. Pt aware of appt.

## 2023-05-26 NOTE — Telephone Encounter (Signed)
PT is calling to request to speak with a nurse regarding an anal fissure. Please advise.

## 2023-05-27 ENCOUNTER — Other Ambulatory Visit: Payer: Self-pay | Admitting: Family Medicine

## 2023-05-27 ENCOUNTER — Telehealth: Payer: Self-pay

## 2023-05-27 MED ORDER — ALPRAZOLAM 0.5 MG PO TABS: ORAL_TABLET | ORAL | 0 refills | Status: DC

## 2023-05-27 NOTE — Telephone Encounter (Signed)
Copied from CRM [id number]. Topic: Clinical - Medication Refill >> May 27, 2023 12:35 PM Hector Shade B wrote: Most Recent Primary Care Visit:  Provider: Lynnea Ferrier T  Department: [facility]  Visit Type: PHYSICAL  Date: 02/19/2023  Medication:  ALPRAZolam (XANAX) 0.5 MG tablet    Has the patient contacted their pharmacy? Yes (Agent: If no, request that the patient contact the pharmacy for the refill. If patient does not wish to contact the pharmacy document the reason why and proceed with request.) (Agent: If yes, when and what did the pharmacy advise?)  Is this the correct pharmacy for this prescription? Yes If no, delete pharmacy and type the correct one.  This is the patient's preferred pharmacy:  [id number] [place], [place] - [address] [place] [address] Phone: [phone] Fax: [phone]   Has the prescription been filled recently? No The list time the prescriptions was filled was 04/10/2023  Is the patient out of the medication? Yes  Has the patient been seen for an appointment in the last year OR does the patient have an upcoming appointment? Yes patient stated she stopped by on Monday to the office to speak with front office in regards to getting prescription filled but had not yet heard back from them in regards to the prescription and pharmacy states there's no prescription refill there   Can we respond through MyChart? Yes  Agent: Please be advised that Rx refills may take up to 3 business days. We ask that you follow-up with your pharmacy.

## 2023-06-02 ENCOUNTER — Encounter: Payer: Self-pay | Admitting: Nurse Practitioner

## 2023-06-02 ENCOUNTER — Ambulatory Visit: Payer: Medicare Other | Admitting: Nurse Practitioner

## 2023-06-02 ENCOUNTER — Other Ambulatory Visit (INDEPENDENT_AMBULATORY_CARE_PROVIDER_SITE_OTHER): Payer: Medicare Other

## 2023-06-02 VITALS — BP 100/70 | HR 73 | Ht 63.0 in | Wt 109.4 lb

## 2023-06-02 DIAGNOSIS — R300 Dysuria: Secondary | ICD-10-CM

## 2023-06-02 DIAGNOSIS — K625 Hemorrhage of anus and rectum: Secondary | ICD-10-CM

## 2023-06-02 DIAGNOSIS — K648 Other hemorrhoids: Secondary | ICD-10-CM | POA: Diagnosis not present

## 2023-06-02 LAB — URINALYSIS WITH CULTURE, IF INDICATED
Bilirubin Urine: NEGATIVE
Hgb urine dipstick: NEGATIVE
Ketones, ur: NEGATIVE
Nitrite: NEGATIVE
Specific Gravity, Urine: 1.01 (ref 1.000–1.030)
Total Protein, Urine: NEGATIVE
Urine Glucose: NEGATIVE
Urobilinogen, UA: 0.2 (ref 0.0–1.0)
pH: 7.5 (ref 5.0–8.0)

## 2023-06-02 MED ORDER — HYDROCORTISONE (PERIANAL) 2.5 % EX CREA: 1.0000 | TOPICAL_CREAM | Freq: Every day | CUTANEOUS | 1 refills | Status: DC

## 2023-06-02 NOTE — Addendum Note (Signed)
Addended by: Sumner Boast on: 06/02/2023 01:12 PM   Modules accepted: Orders

## 2023-06-02 NOTE — Progress Notes (Signed)
ASSESSMENT    Brief Narrative:  77 y.o.  female known to Dr. Marina Goodell with a past medical history not limited to colon polyps and hemorrhoids s/p banding  Internal hemorrhoids s/p banding in July. Banding complicated by bleeding from post banding ulcers.   Recurrent painless rectal bleeding with bowel movements. Swollen internal hemorrhoids on anoscopy.   Dysuria and malodorous urine.   See PMH for any additional medical & surgical history   PLAN   --Stools still hard at times. Will increase stool softeners to two at bedtime.  --Anusol cream PR night x 10 days --Will get her an appt with Dr. Leone Payor for possible additional hemorrhoid banding. He may have concerns proceeding given her hx of post banding ulcer bleeding.  --U/A culture  HPI   Chief complaint : recurrent rectal bleeding  Brief GI History:  Katelyn [name] internal hemorrhoid banding in our office on 12/31/22. On 7/21 she developed rectal bleeding with BMs. Went to ED, admitted and underwent flexible sigmoidoscopy with findings of two ulcers in the distal rectum c/w post banding ulcers. Also, three 5 to 7 mm polyps in the rectum and in the sigmoid colon which were left intact. She returned in late Aug for full colonoscopy with removal of multiple polyps ( all < 10 mm)  Interval History:  Katelyn [name]'s hemorrhoid bleeding responded to banding but bleeding started back several weeks ago . She also has perianal itching and burning. Using Prep H as needed. Stool are sometimes hard despite fiber supplement   GI History / Pertinent GI Studies   **All endoscopic studies may not be included here    01/14/23 Flexible sigmoidoscopy  - Two ulcers in the distal rectum c/w post banding ulcers. One retained hemorrhoid band removed with cold forceps Clips (MR conditional) were placed. Clip manufacturer: [employer]. - Internal hemorrhoids. - Three 5 to 7 mm polyps in the rectum and in the sigmoid colon. - The examination was otherwise  normal. - No specimens collected  Aug 2024 - Seven 3 to 8 mm polyps in the descending colon, in the transverse colon, in the ascending colon and in the cecum, removed with a cold snare. Resected and retrieved. - One 2 mm polyp in the rectum, removed with a jumbo cold forceps. Resected and retrieved. - Diverticulosis in the left colon. - Internal hemorrhoids  Surgical [P], colon, cecum x 1, polyp (1) TUBULAR ADENOMA. NEGATIVE FOR HIGH-GRADE DYSPLASIA. 2. Surgical [P], colon, cecum x 1, polyp (1) SESSILE SERRATED POLYP WITHOUT CYTOLOGIC DYSPLASIA. 3. Surgical [P], colon, ascending polyp x 2, polyp (2) SEGMENTS OF TUBULAR ADENOMA. NEGATIVE FOR HIGH-GRADE DYSPLASIA. 4. Surgical [P], colon, transverse polyp x 1, polyp (1) FRAGMENTS OF SESSILE SERRATED POLYP WITHOUT CYTOLOGIC DYSPLASIA. 5. Surgical [P], colon, descending polyp x 2, polyp (2) SEGMENTS OF TUBULAR ADENOMA. NEGATIVE FOR HIGH-GRADE DYSPLASIA. 6. Surgical [P], colon, rectum polyp x 1, polyp (1) HYPERPLASTIC POLYP WITH PROLAPSE CHANGES. NEGATIVE FOR DYSPLASIA  Repeat 3      Latest Ref Rng & Units 02/19/2023    8:51 AM 04/03/2022    6:19 PM 11/10/2021    3:22 PM  Hepatic Function  Total Protein 6.1 - 8.1 g/dL 6.3  6.7  6.8   Albumin 3.5 - 5.0 g/dL  3.9    AST 10 - 35 U/L 27  27  24    ALT 6 - 29 U/L 30  28  25    Alk Phosphatase 38 - 126 U/L  60    Total Bilirubin 0.2 -  1.2 mg/dL 0.4  0.4  0.3   Bilirubin, Direct 0.0 - 0.2 mg/dL 0.1          Latest Ref Rng & Units 01/14/2023   10:45 AM 01/13/2023    2:45 PM 04/03/2022    6:19 PM  CBC  WBC 4.0 - 10.5 K/uL 5.2  5.4  8.3   Hemoglobin 12.0 - 15.0 g/dL 81.1  91.4  78.2   Hematocrit 36.0 - 46.0 % 42.7  40.0  36.5   Platelets 150 - 400 K/uL 291  291.0  344      Past Medical History:  Diagnosis Date   Anxiety    Arthritis    cervical and lumbar DDD   Heart murmur    Hyperlipidemia    Hypertension    Migraines    Due to neck pain   Osteoporosis     Past Surgical  History:  Procedure Laterality Date   BUNIONECTOMY     right   CERVICAL FUSION     x3   COLONOSCOPY     FLEXIBLE SIGMOIDOSCOPY N/A 01/14/2023   Procedure: FLEXIBLE SIGMOIDOSCOPY;  Surgeon: Meryl Dare, MD;  Location: [facility];  Service: Gastroenterology;  Laterality: N/A;   HEMOSTASIS CLIP PLACEMENT  01/14/2023   Procedure: HEMOSTASIS CLIP PLACEMENT;  Surgeon: Meryl Dare, MD;  Location: [facility];  Service: Gastroenterology;;   LUMBAR DISC SURGERY     POLYPECTOMY     radiofrequency ablasion     SPINE SURGERY      Dr. Noel Gerold (x2, last Nov/15) C3-T4   UPPER GASTROINTESTINAL ENDOSCOPY      Family History  Problem Relation Age of Onset   Colon polyps Mother    Arthritis Mother    Hearing loss Mother    Hyperlipidemia Mother    Hypertension Mother    Stroke Mother    Heart disease Mother    Colon polyps Father    Arthritis Father    Heart disease Father    Hyperlipidemia Father    Arthritis Maternal Grandmother    Arthritis Maternal Grandfather    Arthritis Paternal Grandmother    Arthritis Paternal Grandfather    Colon cancer Neg Hx    Esophageal cancer Neg Hx    Stomach cancer Neg Hx    Rectal cancer Neg Hx     Current Medications, Allergies, Family History and Social History were reviewed in [employer] electronic medical record.     Current Outpatient Medications  Medication Sig Dispense Refill   ALPRAZolam (XANAX) 0.5 MG tablet TAKE 1 TABLET BY MOUTH THREE TIMES A DAY AS NEEDED FOR ANXIETY 90 tablet 0   Ascorbic Acid (VITAMIN C) 500 MG tablet Take 500 mg by mouth daily.       aspirin 81 MG tablet Take 81 mg by mouth daily.       B Complex Vitamins (VITAMIN B COMPLEX PO) Take 1 tablet by mouth daily.       calcium carbonate (OS-CAL) 600 MG TABS Take 600 mg by mouth 2 (two) times daily with a meal.       diclofenac sodium (VOLTAREN) 1 % GEL Apply 2 g topically 4 (four) times daily.     DULoxetine (CYMBALTA) 30 MG capsule Take 1 capsule (30 mg  total) by mouth 2 (two) times daily. 180 capsule 3   fish oil-omega-3 fatty acids 1000 MG capsule Take 1 g by mouth 2 (two) times daily.       Garlic 1000 MG CAPS Take 1  capsule by mouth daily.       hydrochlorothiazide (HYDRODIURIL) 25 MG tablet Take 1 tablet (25 mg total) by mouth daily. 90 tablet 2   HYDROcodone-acetaminophen (NORCO/VICODIN) 5-325 MG tablet      Lysine 500 MG CAPS Take 1 capsule by mouth daily.       magnesium oxide (MAG-OX) 400 MG tablet Take 400 mg by mouth daily.       Multiple Vitamins-Minerals (MULTIVITAMIN WITH MINERALS) tablet Take 1 tablet by mouth daily.       nortriptyline (PAMELOR) 10 MG capsule Take 10 mg by mouth See admin instructions. Take two capsules by mouth (20 mg) every morning and three capsules by mouth (30 mg) every evening.     POTASSIUM GLUCONATE PO Take 1 tablet by mouth daily.       PRESCRIPTION MEDICATION Trigger point injections.  Once every 3 months.     simvastatin (ZOCOR) 20 MG tablet Take 1 tablet (20 mg total) by mouth at bedtime. 90 tablet 2   SUMAtriptan (IMITREX) 100 MG tablet Take 1 tablet (100 mg total) by mouth every 2 (two) hours as needed for migraine. May repeat in 2 hours if headache persists or recurs. 12 tablet 1   traMADol (ULTRAM) 50 MG tablet Take 50 mg by mouth every 8 (eight) hours as needed. for pain  0   vitamin E 400 UNIT capsule Take 400 Units by mouth daily.       zolpidem (AMBIEN) 5 MG tablet TAKE 1 TABLET BY MOUTH AT BEDTIME AS NEEDED FOR SLEEP (Patient taking differently: Take 5 mg by mouth at bedtime as needed for sleep.) 30 tablet 3   alendronate (FOSAMAX) 70 MG tablet TAKE 1 TABLET BY MOUTH EVERY 7 DAYS WITH A FULL GLASS OF WATER ON EMPTY STOMACH 12 tablet 3   No current facility-administered medications for this visit.    Review of Systems: No chest pain. No shortness of breath. Positive for dysuria and malodorous urine.     Physical Exam  Filed Weights   06/02/23 1019  Weight: 109 lb 6.4 oz (49.6 kg)    Wt Readings from Last 3 Encounters:  06/02/23 109 lb 6.4 oz (49.6 kg)  02/19/23 106 lb 6.4 oz (48.3 kg)  02/10/23 105 lb (47.6 kg)    BP 100/70   Pulse 73   Ht 5\' 3"  (1.6 m)   Wt 109 lb 6.4 oz (49.6 kg)   BMI 19.38 kg/m  Constitutional:  Pleasant, generally well appearing female in no acute distress. Psychiatric: Normal mood and affect. Behavior is normal. EENT: Pupils normal.  Conjunctivae are normal. No scleral icterus. Neck supple.  Cardiovascular: Normal rate, regular rhythm.  Pulmonary/chest: Effort normal and breath sounds normal. No wheezing, rales or rhonchi. Abdominal: Soft, nondistended, nontender. Bowel sounds active throughout. There are no masses palpable. No hepatomegaly. Rectal: Perianal hemorrhoid tags. On anoscopy there were swollen internal hemorrhoids.  Neurological: Alert and oriented to person place and time.    Willette Cluster, NP  06/02/2023, 10:42 AM  Cc:  Donita Brooks, MD

## 2023-06-02 NOTE — Patient Instructions (Signed)
Your provider has requested that you go to the basement level for lab work before leaving today. Press "B" on the elevator. The lab is located at the first door on the left as you exit the elevator.  We have sent the following medications to your pharmacy for you to pick up at your convenience: Anusol HC cream - daily at bedtime for 10 days.  Increase your stool softener to 2 at bedtime.  Please follow up with Dr. Leone Payor on _________________________  _______________________________________________________  If your blood pressure at your visit was 140/90 or greater, please contact your primary care physician to follow up on this.  _______________________________________________________  If you are age 46 or older, your body mass index should be between 23-30. Your Body mass index is 19.38 kg/m. If this is out of the aforementioned range listed, please consider follow up with your Primary Care Provider.  If you are age 44 or younger, your body mass index should be between 19-25. Your Body mass index is 19.38 kg/m. If this is out of the aformentioned range listed, please consider follow up with your Primary Care Provider.   ________________________________________________________  The [facility] providers would like to encourage you to use [facility] to communicate with providers for non-urgent requests or questions.  Due to long hold times on the telephone, sending your provider a message by [facility] may be a faster and more efficient way to get a response.  Please allow 48 business hours for a response.  Please remember that this is for non-urgent requests.  _______________________________________________________

## 2023-06-03 ENCOUNTER — Other Ambulatory Visit: Payer: Self-pay | Admitting: Family Medicine

## 2023-06-03 ENCOUNTER — Telehealth: Payer: Self-pay

## 2023-06-03 MED ORDER — CEPHALEXIN 500 MG PO CAPS: 500.0000 mg | ORAL_CAPSULE | Freq: Three times a day (TID) | ORAL | 0 refills | Status: DC

## 2023-06-03 NOTE — Telephone Encounter (Signed)
Copied from CRM [id number]. Topic: Clinical - Medical Advice >> Jun 03, 2023 10:42 AM Katelyn [name] wrote: Reason for EAV:[name] had a Urinalysis with Culture yesterday due to UTI symptoms. Patient would like Dr.Pickard to review them and see if he can prescribe a medication. Advised patient that the culture results were pending.

## 2023-06-04 LAB — URINE CULTURE
MICRO NUMBER:: 15837484
SPECIMEN QUALITY:: ADEQUATE

## 2023-06-24 ENCOUNTER — Ambulatory Visit: Payer: Medicare Other | Admitting: Internal Medicine

## 2023-06-24 ENCOUNTER — Encounter: Payer: Self-pay | Admitting: Internal Medicine

## 2023-06-24 VITALS — BP 116/80 | HR 92 | Ht 63.0 in | Wt 110.0 lb

## 2023-06-24 DIAGNOSIS — K641 Second degree hemorrhoids: Secondary | ICD-10-CM

## 2023-06-24 NOTE — Progress Notes (Signed)
   HEMORRHOID LIGATION:  Indications: Recurrent bleeding, anal irritation and pruritus.  History of previous banding of left lateral and right posterior internal hemorrhoids July 2024 it was complicated by post banding bleeding treated with sigmoidoscopy and clips.  She is aware of the recurrent risk of bleeding but receives benefit from the hemorrhoid banding and desires repeat.  She is moving her bowels without difficulty using Benefiber no diarrhea no constipation no straining to stool.  She had been seen by NP [place] in December and complained of bleeding and these hemorrhoid symptoms and was treated with hydrocortisone  cream with benefit though she has persistent itching and discomfort.  Patti Jordan, CMA present.  Rectal exam shows chronic external hemorrhoids soft and fleshy in all 3 positions.  Digital exam is nontender.  There is no mass.  There is no perianal dermatitis.  Anoscopy confirms a grade 2 right posterior internal hemorrhoid complex.  It is swollen edematous and erythematous.  Right anterior left lateral complexes are not enlarged or inflamed.  PROCEDURE NOTE: The patient presents with symptomatic grade 2  hemorrhoids, requesting rubber band ligation of his/her hemorrhoidal disease.  All risks, benefits and alternative forms of therapy were described and informed consent was obtained.   The anorectum was pre-medicated with 5% lidocaine  and 0.125% nitroglycerin The decision was made to band the right posterior internal hemorrhoid, and the [facility] O'Regan System was used to perform band ligation without complication.  Digital anorectal examination was then performed to assure proper positioning of the band, and to adjust the banded tissue as required.  The patient was discharged home without pain or other issues.  Dietary and behavioral recommendations were given and along with follow-up instructions.     The following adjunctive treatments were recommended:  Continue  Benefiber, consider 5% lidocaine  cream as needed also for anorectal symptoms.  The patient will return as needed for  follow-up and possible additional banding as required. No complications were encountered and the patient tolerated the procedure well.

## 2023-06-24 NOTE — Patient Instructions (Signed)
HEMORRHOID BANDING PROCEDURE    FOLLOW-UP CARE   The procedure you have had should have been relatively painless since the banding of the area involved does not have nerve endings and there is no pain sensation.  The rubber band cuts off the blood supply to the hemorrhoid and the band may fall off as soon as 48 hours after the banding (the band may occasionally be seen in the toilet bowl following a bowel movement). You may notice a temporary feeling of fullness in the rectum which should respond adequately to plain Tylenol or Motrin.  Following the banding, avoid strenuous exercise that evening and resume full activity the next day.  A sitz bath (soaking in a warm tub) or bidet is soothing, and can be useful for cleansing the area after bowel movements.     To avoid constipation, take two tablespoons of natural wheat bran, natural oat bran, flax, Benefiber or any over the counter fiber supplement and increase your water intake to 7-8 glasses daily.    Unless you have been prescribed anorectal medication, do not put anything inside your rectum for two weeks: No suppositories, enemas, fingers, etc.  Occasionally, you may have more bleeding than usual after the banding procedure.  This is often from the untreated hemorrhoids rather than the treated one.  Don't be concerned if there is a tablespoon or so of blood.  If there is more blood than this, lie flat with your bottom higher than your head and apply an ice pack to the area. If the bleeding does not stop within a half an hour or if you feel faint, call our office at [phone] or go to the emergency room.  Problems are not common; however, if there is a substantial amount of bleeding, severe pain, chills, fever or difficulty passing urine (very rare) or other problems, you should call us at [phone] or report to the nearest emergency room.  Do not stay seated continuously for more than 2-3 hours for a day or two after the procedure.   Tighten your buttock muscles 10-15 times every two hours and take 10-15 deep breaths every 1-2 hours.  Do not spend more than a few minutes on the toilet if you cannot empty your bowel; instead re-visit the toilet at a later time.  I appreciate the opportunity to care for you. Carl Gessner, MD, FACG  

## 2023-07-01 ENCOUNTER — Other Ambulatory Visit: Payer: Self-pay | Admitting: Family Medicine

## 2023-07-01 DIAGNOSIS — G43009 Migraine without aura, not intractable, without status migrainosus: Secondary | ICD-10-CM

## 2023-07-01 DIAGNOSIS — G894 Chronic pain syndrome: Secondary | ICD-10-CM

## 2023-07-05 ENCOUNTER — Ambulatory Visit (INDEPENDENT_AMBULATORY_CARE_PROVIDER_SITE_OTHER): Payer: Medicare Other | Admitting: Family Medicine

## 2023-07-05 VITALS — BP 120/72 | HR 82 | Temp 97.7°F

## 2023-07-05 DIAGNOSIS — N952 Postmenopausal atrophic vaginitis: Secondary | ICD-10-CM | POA: Diagnosis not present

## 2023-07-05 NOTE — Progress Notes (Signed)
 Subjective:    Patient ID: Katelyn [name], female    DOB: 1945/11/04, 78 y.o.   MRN: [id number]  Urinary Tract Infection   Medication Refill   Patient believes she had 3 urinary tract infections last year.  She is here today requesting a pelvic exam to ensure that there is no abnormality that could be causing the urinary tract infections.  Chaperone was present throughout the entire exam.  Patient was placed in the lithotomy position.  Both vulva appear normal.  There is no rash.  There are no abnormal lesions to suggest vulvar cancer.  Speculum was inserted.  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  The cervix itself appears normal and healthy.  The urethra also appears healthy and normal Past Medical History:  Diagnosis Date   Anxiety    Arthritis    cervical and lumbar DDD   Heart murmur    Hemorrhoids    Hyperlipidemia    Hypertension    Migraines    Due to neck pain   Osteoporosis    Past Surgical History:  Procedure Laterality Date   BUNIONECTOMY     right   CERVICAL FUSION     x3   COLONOSCOPY     FLEXIBLE SIGMOIDOSCOPY N/A 01/14/2023   Procedure: FLEXIBLE SIGMOIDOSCOPY;  Surgeon: Aneita Gwendlyn DASEN, MD;  Location: [facility];  Service: Gastroenterology;  Laterality: N/A;   HEMORRHOID BANDING     HEMOSTASIS CLIP PLACEMENT  01/14/2023   Procedure: HEMOSTASIS CLIP PLACEMENT;  Surgeon: Aneita Gwendlyn DASEN, MD;  Location: [facility];  Service: Gastroenterology;;   LUMBAR DISC SURGERY     POLYPECTOMY     radiofrequency ablasion     SPINE SURGERY      Dr. Gust (x2, last Nov/15) C3-T4   UPPER GASTROINTESTINAL ENDOSCOPY     Current Outpatient Medications on File Prior to Visit  Medication Sig Dispense Refill   ALPRAZolam  (XANAX ) 0.5 MG tablet TAKE 1 TABLET BY MOUTH THREE TIMES A DAY AS NEEDED FOR ANXIETY 90 tablet 0   Ascorbic Acid (VITAMIN C) 500 MG tablet Take 500 mg by mouth daily.       aspirin  81 MG tablet Take 81 mg by mouth daily.        B Complex Vitamins (VITAMIN B COMPLEX PO) Take 1 tablet by mouth daily.       calcium carbonate (OS-CAL) 600 MG TABS Take 600 mg by mouth 2 (two) times daily with a meal.       cephALEXin  (KEFLEX ) 500 MG capsule Take 1 capsule (500 mg total) by mouth 3 (three) times daily. 21 capsule 0   diclofenac  sodium (VOLTAREN ) 1 % GEL Apply 2 g topically 4 (four) times daily.     DULoxetine  (CYMBALTA ) 30 MG capsule Take 1 capsule (30 mg total) by mouth 2 (two) times daily. 180 capsule 3   fish oil-omega-3 fatty acids 1000 MG capsule Take 1 g by mouth 2 (two) times daily.       Garlic 1000 MG CAPS Take 1 capsule by mouth daily.       hydrochlorothiazide  (HYDRODIURIL ) 25 MG tablet Take 1 tablet (25 mg total) by mouth daily. 90 tablet 2   HYDROcodone-acetaminophen  (NORCO/VICODIN) 5-325 MG tablet      hydrocortisone  (ANUSOL -HC) 2.5 % rectal cream Place 1 Application rectally at bedtime. 28 g 1   Lysine 500 MG CAPS Take 1 capsule by mouth daily.       magnesium oxide (MAG-OX) 400 MG  tablet Take 400 mg by mouth daily.       Multiple Vitamins-Minerals (MULTIVITAMIN WITH MINERALS) tablet Take 1 tablet by mouth daily.       nortriptyline  (PAMELOR ) 10 MG capsule Take 10 mg by mouth See admin instructions. Take two capsules by mouth (20 mg) every morning and three capsules by mouth (30 mg) every evening.     POTASSIUM GLUCONATE PO Take 1 tablet by mouth daily.       PRESCRIPTION MEDICATION Trigger point injections.  Once every 3 months.     simvastatin  (ZOCOR ) 20 MG tablet Take 1 tablet (20 mg total) by mouth at bedtime. 90 tablet 2   SUMAtriptan  (IMITREX ) 100 MG tablet PLEASE SEE ATTACHED FOR DETAILED DIRECTIONS 10 tablet 2   traMADol  (ULTRAM ) 50 MG tablet Take 50 mg by mouth every 8 (eight) hours as needed. for pain  0   vitamin E 400 UNIT capsule Take 400 Units by mouth daily.       zolpidem  (AMBIEN ) 5 MG tablet TAKE 1 TABLET BY MOUTH AT BEDTIME AS NEEDED FOR SLEEP (Patient taking differently: Take 5 mg by  mouth at bedtime as needed for sleep.) 30 tablet 3   No current facility-administered medications on file prior to visit.   Allergies  Allergen Reactions   Iodine Hives   Prednisone Hives    Pt took Benadryl  with Prednisone for surgery and did ok with it   Sulfa Antibiotics Swelling   Red Dye #40 (Allura Red) Rash   Social History   Socioeconomic History   Marital status: Married    Spouse name: Fairy   Number of children: 2   Years of education: [employer] education level: Associate degree: academic program  Occupational History   Occupation: Retired  Tobacco Use   Smoking status: Never   Smokeless tobacco: Never  Vaping Use   Vaping status: Never Used  Substance and Sexual Activity   Alcohol use: Yes    Alcohol/week: 7.0 standard drinks of alcohol    Types: 7 Glasses of wine per week    Comment: 1-2 rare   Drug use: No   Sexual activity: Yes  Other Topics Concern   Not on file  Social History Narrative   Lives with husband   Caffeine use: none   Social Drivers of Corporate Investment Banker Strain: Low Risk  (07/03/2023)   Overall Financial Resource Strain (CARDIA)    Difficulty of Paying Living Expenses: Not hard at all  Food Insecurity: No Food Insecurity (07/03/2023)   Hunger Vital Sign    Worried About Running Out of Food in the Last Year: Never true    Ran Out of Food in the Last Year: Never true  Transportation Needs: No Transportation Needs (07/03/2023)   PRAPARE - Administrator, Civil Service (Medical): No    Lack of Transportation (Non-Medical): No  Physical Activity: Sufficiently Active (07/03/2023)   Exercise Vital Sign    Days of Exercise per Week: 5 days    Minutes of Exercise per Session: 30 min  Stress: No Stress Concern Present (07/03/2023)   [employer] of Occupational Health - Occupational Stress Questionnaire    Feeling of Stress : Not at all  Social Connections: Moderately Integrated (07/03/2023)   Social  Connection and Isolation Panel [NHANES]    Frequency of Communication with Friends and Family: More than three times a week    Frequency of Social Gatherings with Friends and Family: Twice a week  Attends Religious Services: More than 4 times per year    Active Member of Clubs or Organizations: No    Attends Banker Meetings: Not on file    Marital Status: Married  Intimate Partner Violence: Unknown (09/24/2021)   Received from [employer], [facility]   HITS    Physically Hurt: Not on file    Insult or Talk Down To: Not on file    Threaten Physical Harm: Not on file    Scream or Curse: Not on file   Family History  Problem Relation Age of Onset   Colon polyps Mother    Arthritis Mother    Hearing loss Mother    Hyperlipidemia Mother    Hypertension Mother    Stroke Mother    Heart disease Mother    Colon polyps Father    Arthritis Father    Heart disease Father    Hyperlipidemia Father    Arthritis Maternal Grandmother    Arthritis Maternal Grandfather    Arthritis Paternal Grandmother    Arthritis Paternal Grandfather    Colon cancer Neg Hx    Esophageal cancer Neg Hx    Stomach cancer Neg Hx    Rectal cancer Neg Hx       Review of Systems  All other systems reviewed and are negative.      Objective:   Physical Exam Vitals reviewed. Exam conducted with a chaperone present.  Constitutional:      Appearance: She is well-developed.  HENT:     Head: Normocephalic and atraumatic.  Cardiovascular:     Rate and Rhythm: Normal rate and regular rhythm.     Heart sounds: No murmur heard.    No friction rub. No gallop.  Pulmonary:     Effort: Pulmonary effort is normal. No respiratory distress.     Breath sounds: Examination of the left-upper field reveals decreased breath sounds. Examination of the left-middle field reveals decreased breath sounds. Examination of the left-lower field reveals decreased breath sounds. Decreased breath sounds present.  No wheezing, rhonchi or rales.  Chest:     Chest wall: No tenderness.  Genitourinary:    General: Normal vulva.     Exam position: Lithotomy position.     Pubic Area: No rash.      Labia:        Right: No rash or lesion.        Left: No rash or lesion.      Urethra: No prolapse, urethral pain, urethral swelling or urethral lesion.     Vagina: Normal. No signs of injury. No vaginal discharge, erythema, tenderness, bleeding or lesions.     Cervix: Normal. No cervical motion tenderness, friability or erythema.     Uterus: Normal.   Lymphadenopathy:     Lower Body: No right inguinal adenopathy. No left inguinal adenopathy.  Skin:    General: Skin is warm and dry.     Coloration: Skin is not pale.     Findings: No erythema or rash.  Psychiatric:        Mood and Affect: Mood normal. Mood is not anxious.        Speech: Speech normal.        Behavior: Behavior normal.        Thought Content: Thought content normal.        Judgment: Judgment normal.           Assessment & Plan:  Atrophic vaginitis The only abnormality seen on pelvic exam  today is atrophic vaginitis.  We discussed using topical estrogen on a daily basis for 1 to 2 months to see if this helps with vaginal dryness.  Patient prefers to use badges sill on an as-needed basis.

## 2023-07-07 DIAGNOSIS — M461 Sacroiliitis, not elsewhere classified: Secondary | ICD-10-CM | POA: Diagnosis not present

## 2023-07-07 DIAGNOSIS — Z681 Body mass index (BMI) 19 or less, adult: Secondary | ICD-10-CM | POA: Diagnosis not present

## 2023-07-07 DIAGNOSIS — M4326 Fusion of spine, lumbar region: Secondary | ICD-10-CM | POA: Diagnosis not present

## 2023-07-07 DIAGNOSIS — M4322 Fusion of spine, cervical region: Secondary | ICD-10-CM | POA: Diagnosis not present

## 2023-07-09 ENCOUNTER — Other Ambulatory Visit: Payer: Self-pay | Admitting: Family Medicine

## 2023-07-09 ENCOUNTER — Telehealth: Payer: Self-pay

## 2023-07-09 MED ORDER — ALPRAZOLAM 0.5 MG PO TABS: ORAL_TABLET | ORAL | 0 refills | Status: DC

## 2023-07-09 NOTE — Telephone Encounter (Signed)
Copied from CRM [id number]. Topic: Clinical - Medication Refill >> Jul 09, 2023 10:40 AM Gildardo Pounds wrote: Most Recent Primary Care Visit:  Provider: Lynnea Ferrier T  Department: [facility]  Visit Type: OFFICE VISIT  Date: 07/05/2023  Medication: ***  Has the patient contacted their pharmacy?  (Agent: If no, request that the patient contact the pharmacy for the refill. If patient does not wish to contact the pharmacy document the reason why and proceed with request.) (Agent: If yes, when and what did the pharmacy advise?)  Is this the correct pharmacy for this prescription?  If no, delete pharmacy and type the correct one.  This is the patient's preferred pharmacy:  [id number] [place], [place] - [address] [place] [address] Phone: [phone] Fax: [phone]   Has the prescription been filled recently?   Is the patient out of the medication?   Has the patient been seen for an appointment in the last year OR does the patient have an upcoming appointment?   Can we respond through MyChart?   Agent: Please be advised that Rx refills may take up to 3 business days. We ask that you follow-up with your pharmacy.

## 2023-07-09 NOTE — Telephone Encounter (Signed)
Prescription Request  07/09/2023  LOV: 07/05/23  What is the name of the medication or equipment? pantoprazole (PROTONIX) 40 MG tablet [[id number]]  DISCONTINUED   Have you contacted your pharmacy to request a refill? Yes   Which pharmacy would you like this sent to?  [id number] [place], [place] - [address] [place] [address] Phone: [phone] Fax: [phone]    Patient notified that their request is being sent to the clinical staff for review and that they should receive a response within 2 business days.   Please advise at [facility] [phone]

## 2023-07-15 ENCOUNTER — Other Ambulatory Visit: Payer: Self-pay

## 2023-07-15 DIAGNOSIS — K219 Gastro-esophageal reflux disease without esophagitis: Secondary | ICD-10-CM

## 2023-07-15 MED ORDER — PANTOPRAZOLE SODIUM 40 MG PO TBEC: 40.0000 mg | DELAYED_RELEASE_TABLET | Freq: Every day | ORAL | 1 refills | Status: DC

## 2023-08-05 DIAGNOSIS — M4322 Fusion of spine, cervical region: Secondary | ICD-10-CM | POA: Diagnosis not present

## 2023-08-05 DIAGNOSIS — Z681 Body mass index (BMI) 19 or less, adult: Secondary | ICD-10-CM | POA: Diagnosis not present

## 2023-08-05 DIAGNOSIS — M791 Myalgia, unspecified site: Secondary | ICD-10-CM | POA: Diagnosis not present

## 2023-08-05 DIAGNOSIS — M461 Sacroiliitis, not elsewhere classified: Secondary | ICD-10-CM | POA: Diagnosis not present

## 2023-08-17 ENCOUNTER — Other Ambulatory Visit: Payer: Self-pay | Admitting: Family Medicine

## 2023-09-22 ENCOUNTER — Other Ambulatory Visit: Payer: Self-pay | Admitting: Family Medicine

## 2023-09-22 DIAGNOSIS — M791 Myalgia, unspecified site: Secondary | ICD-10-CM | POA: Diagnosis not present

## 2023-09-22 DIAGNOSIS — Z981 Arthrodesis status: Secondary | ICD-10-CM | POA: Diagnosis not present

## 2023-09-22 DIAGNOSIS — M461 Sacroiliitis, not elsewhere classified: Secondary | ICD-10-CM | POA: Diagnosis not present

## 2023-09-22 DIAGNOSIS — M4322 Fusion of spine, cervical region: Secondary | ICD-10-CM | POA: Diagnosis not present

## 2023-09-22 NOTE — Telephone Encounter (Unsigned)
 Copied from CRM [id number]. Topic: Clinical - Medication Refill >> Sep 22, 2023  3:36 PM Franchot Heidelberg wrote: Most Recent Primary Care Visit:  Provider: Lynnea Ferrier T  Department: [facility]  Visit Type: OFFICE VISIT  Date: 07/05/2023  Medication: ALPRAZolam (XANAX) 0.5 MG tablet   Has the patient contacted their pharmacy? Yes (Agent: If no, request that the patient contact the pharmacy for the refill. If patient does not wish to contact the pharmacy document the reason why and proceed with request.) (Agent: If yes, when and what did the pharmacy advise?)  Is this the correct pharmacy for this prescription? Yes If no, delete pharmacy and type the correct one.  This is the patient's preferred pharmacy:  [facility] [address], [place] - [address] [place] [address] [place] [address] Phone: [phone] Fax: [phone]   Has the prescription been filled recently? Yes  Is the patient out of the medication? No.   Has the patient been seen for an appointment in the last year OR does the patient have an upcoming appointment? Yes  Can we respond through MyChart? Yes  Agent: Please be advised that Rx refills may take up to 3 business days. We ask that you follow-up with your pharmacy.

## 2023-09-24 MED ORDER — ALPRAZOLAM 0.5 MG PO TABS: ORAL_TABLET | ORAL | 0 refills | Status: DC

## 2023-09-24 NOTE — Telephone Encounter (Signed)
 Requested medication (s) are due for refill today: yes  Requested medication (s) are on the active medication list: yes  Last refill:  08/17/23  Future visit scheduled:yes  Notes to clinic:  Unable to refill per protocol, cannot delegate.      Requested Prescriptions  Pending Prescriptions Disp Refills   ALPRAZolam (XANAX) 0.5 MG tablet 90 tablet 0    Sig: TAKE 1 TABLET BY MOUTH THREE TIMES A DAY AS NEEDED FOR ANXIETY     Not Delegated - Psychiatry: Anxiolytics/Hypnotics 2 Failed - 09/24/2023  9:07 AM      Failed - This refill cannot be delegated      Failed - Urine Drug Screen completed in last 360 days      Failed - Valid encounter within last 6 months    Recent Outpatient Visits           2 months ago Atrophic vaginitis   [place] [facility] Donita Brooks, MD   7 months ago Osteoporosis without current pathological fracture, unspecified osteoporosis type   [place] [facility] Donita Brooks, MD   11 months ago Dysuria   [place] [facility] Donita Brooks, MD   1 year ago Pleurisy   [place] [facility] Donita Brooks, MD   1 year ago Dysuria    [facility] Pick[place], Priscille Heidelberg, MD              Passed - Patient is not pregnant

## 2023-09-27 ENCOUNTER — Encounter: Payer: Self-pay | Admitting: Family Medicine

## 2023-09-27 ENCOUNTER — Ambulatory Visit: Admitting: Family Medicine

## 2023-09-27 VITALS — BP 116/68 | HR 85 | Temp 98.6°F | Ht 63.0 in | Wt 109.1 lb

## 2023-09-27 DIAGNOSIS — N898 Other specified noninflammatory disorders of vagina: Secondary | ICD-10-CM | POA: Diagnosis not present

## 2023-09-27 DIAGNOSIS — N390 Urinary tract infection, site not specified: Secondary | ICD-10-CM | POA: Insufficient documentation

## 2023-09-27 DIAGNOSIS — R300 Dysuria: Secondary | ICD-10-CM

## 2023-09-27 LAB — URINALYSIS, ROUTINE W REFLEX MICROSCOPIC
Bacteria, UA: NONE SEEN /HPF
Bilirubin Urine: NEGATIVE
Glucose, UA: NEGATIVE
Hgb urine dipstick: NEGATIVE
Hyaline Cast: NONE SEEN /LPF
Ketones, ur: NEGATIVE
Nitrite: NEGATIVE
Protein, ur: NEGATIVE
RBC / HPF: NONE SEEN /HPF (ref 0–2)
Specific Gravity, Urine: 1.015 (ref 1.001–1.035)
pH: 7 (ref 5.0–8.0)

## 2023-09-27 LAB — MICROSCOPIC MESSAGE

## 2023-09-27 MED ORDER — NYSTATIN-TRIAMCINOLONE 100000-0.1 UNIT/GM-% EX OINT: 1.0000 | TOPICAL_OINTMENT | Freq: Two times a day (BID) | CUTANEOUS | 0 refills | Status: DC

## 2023-09-27 NOTE — Progress Notes (Signed)
 Patient Office Visit  Assessment & Plan:  Dysuria -     Urinalysis, Routine w reflex microscopic -     Urine Culture -     Nystatin-Triamcinolone; Apply 1 Application topically 2 (two) times daily. Apply to affected area BID  Dispense: 30 g; Refill: 0  Itching in the vaginal area -     Nystatin-Triamcinolone; Apply 1 Application topically 2 (two) times daily. Apply to affected area BID  Dispense: 30 g; Refill: 0  Other orders -     Microscopic Message   Patient will let us know if she is not improving or worsening.   Follow-up on urine culture results and notify if positive and need for antibiotic Return if symptoms worsen or fail to improve.   Subjective:    Patient ID: Katelyn [name], female    DOB: November 21, 1945  Age: 78 y.o. MRN: [id number]  Chief Complaint  Patient presents with   Dysuria    X 4 days. Burning, frequency, itching.     HPI Possible UTI/vaginal itching- pt has had dysuria frequency vaginal itching for the last few days.  No recent antibiotics. Having nocturia x one. No gross hematuria. Patient does have a history of previous UTIs, last one was in December 2024 which responded to Keflex (E.Coli)  No fever chills nausea vomiting or back pain. Pt is drinking about 6 glasses of water per day. Pt did not take OTC Azo standard. Pt tried OTC Vagiseal which made it worse. Pt has postmenopausal Atrophic Vaginitis. Pt not using estrogen cream at this time. Pt did use OTC Petroleum jelly which helped a little bit.  Patient can take oral prednisone taper for 6 days and does not have any issue with this.  Patient believes she can do cortisone cream.  If she has any issues she will let us know.  The ASCVD Risk score (Arnett DK, et al., 2019) failed to calculate for the following reasons:   The valid HDL cholesterol range is 20 to 100 mg/dL  Past Medical History:  Diagnosis Date   Anxiety    Arthritis    cervical and lumbar DDD   Heart murmur    Hemorrhoids     Hyperlipidemia    Hypertension    Migraines    Due to neck pain   Osteoporosis    Past Surgical History:  Procedure Laterality Date   BUNIONECTOMY     right   CERVICAL FUSION     x3   COLONOSCOPY     FLEXIBLE SIGMOIDOSCOPY N/A 01/14/2023   Procedure: FLEXIBLE SIGMOIDOSCOPY;  Surgeon: Meryl Dare, MD;  Location: [facility];  Service: Gastroenterology;  Laterality: N/A;   HEMORRHOID BANDING     HEMOSTASIS CLIP PLACEMENT  01/14/2023   Procedure: HEMOSTASIS CLIP PLACEMENT;  Surgeon: Meryl Dare, MD;  Location: [facility];  Service: Gastroenterology;;   LUMBAR DISC SURGERY     POLYPECTOMY     radiofrequency ablasion     SPINE SURGERY      Dr. Noel Gerold (x2, last Nov/15) C3-T4   UPPER GASTROINTESTINAL ENDOSCOPY     Social History   Tobacco Use   Smoking status: Never   Smokeless tobacco: Never  Vaping Use   Vaping status: Never Used  Substance Use Topics   Alcohol use: Yes    Alcohol/week: 7.0 standard drinks of alcohol    Types: 7 Glasses of wine per week    Comment: 1-2 rare   Drug use: No   Family History  Problem  Relation Age of Onset   Colon polyps Mother    Arthritis Mother    Hearing loss Mother    Hyperlipidemia Mother    Hypertension Mother    Stroke Mother    Heart disease Mother    Colon polyps Father    Arthritis Father    Heart disease Father    Hyperlipidemia Father    Arthritis Maternal Grandmother    Arthritis Maternal Grandfather    Arthritis Paternal Grandmother    Arthritis Paternal Grandfather    Colon cancer Neg Hx    Esophageal cancer Neg Hx    Stomach cancer Neg Hx    Rectal cancer Neg Hx    Allergies  Allergen Reactions   Iodine Hives   Prednisone Hives    Pt took Benadryl with Prednisone for surgery and did ok with it   Sulfa Antibiotics Swelling   Red Dye #40 (Allura Red) Rash    ROS    Objective:    BP 116/68   Pulse 85   Temp 98.6 F (37 C)   Ht 5\' 3"  (1.6 m)   Wt 109 lb 2 oz (49.5 kg)   SpO2 99%   BMI  19.33 kg/m  BP Readings from Last 3 Encounters:  09/27/23 116/68  07/05/23 120/72  06/24/23 116/80   Wt Readings from Last 3 Encounters:  09/27/23 109 lb 2 oz (49.5 kg)  06/24/23 110 lb (49.9 kg)  06/02/23 109 lb 6.4 oz (49.6 kg)    Physical Exam Vitals and nursing note reviewed.  Constitutional:      Appearance: Normal appearance.  HENT:     Head: Normocephalic.     Right Ear: Tympanic membrane, ear canal and external ear normal.     Left Ear: Tympanic membrane, ear canal and external ear normal.  Eyes:     Extraocular Movements: Extraocular movements intact.     Pupils: Pupils are equal, round, and reactive to light.  Cardiovascular:     Rate and Rhythm: Normal rate and regular rhythm.     Heart sounds: Normal heart sounds.  Pulmonary:     Effort: Pulmonary effort is normal.     Breath sounds: Normal breath sounds.  Abdominal:     Tenderness: There is no abdominal tenderness. There is no right CVA tenderness, left CVA tenderness or guarding.  Genitourinary:    Labia:        Right: No lesion.        Left: No lesion.      Vagina: No vaginal discharge.     Comments: Atrophic changes noted, no bleeding.  Musculoskeletal:     Right lower leg: No edema.     Left lower leg: No edema.  Neurological:     General: No focal deficit present.     Mental Status: She is alert and oriented to person, place, and time.      Results for orders placed or performed in visit on 09/27/23  Urinalysis, Routine w reflex microscopic  Result Value Ref Range   Color, Urine YELLOW YELLOW   APPearance CLEAR CLEAR   Specific Gravity, Urine 1.015 1.001 - 1.035   pH 7.0 5.0 - 8.0   Glucose, UA NEGATIVE NEGATIVE   Bilirubin Urine NEGATIVE NEGATIVE   Ketones, ur NEGATIVE NEGATIVE   Hgb urine dipstick NEGATIVE NEGATIVE   Protein, ur NEGATIVE NEGATIVE   Nitrite NEGATIVE NEGATIVE   Leukocytes,Ua TRACE (A) NEGATIVE   WBC, UA 0-5 0 - 5 /HPF   RBC /  HPF NONE SEEN 0 - 2 /HPF   Squamous  Epithelial / HPF 0-5 < OR = 5 /HPF   Bacteria, UA NONE SEEN NONE SEEN /HPF   Hyaline Cast NONE SEEN NONE SEEN /LPF  Microscopic Message  Result Value Ref Range   Note

## 2023-09-28 LAB — URINE CULTURE
MICRO NUMBER:: 16297384
SPECIMEN QUALITY:: ADEQUATE

## 2023-09-30 DIAGNOSIS — L814 Other melanin hyperpigmentation: Secondary | ICD-10-CM | POA: Diagnosis not present

## 2023-09-30 DIAGNOSIS — L578 Other skin changes due to chronic exposure to nonionizing radiation: Secondary | ICD-10-CM | POA: Diagnosis not present

## 2023-09-30 DIAGNOSIS — D225 Melanocytic nevi of trunk: Secondary | ICD-10-CM | POA: Diagnosis not present

## 2023-09-30 DIAGNOSIS — D2271 Melanocytic nevi of right lower limb, including hip: Secondary | ICD-10-CM | POA: Diagnosis not present

## 2023-10-12 DIAGNOSIS — M5416 Radiculopathy, lumbar region: Secondary | ICD-10-CM | POA: Diagnosis not present

## 2023-10-25 ENCOUNTER — Ambulatory Visit
Admission: RE | Admit: 2023-10-25 | Discharge: 2023-10-25 | Disposition: A | Discharge status: Home / Self Care | Payer: Medicare Other | Source: Ambulatory Visit | Attending: Family Medicine | Admitting: Family Medicine

## 2023-10-25 DIAGNOSIS — M8588 Other specified disorders of bone density and structure, other site: Secondary | ICD-10-CM | POA: Diagnosis not present

## 2023-10-25 DIAGNOSIS — Z1231 Encounter for screening mammogram for malignant neoplasm of breast: Secondary | ICD-10-CM

## 2023-10-25 DIAGNOSIS — M810 Age-related osteoporosis without current pathological fracture: Secondary | ICD-10-CM

## 2023-10-25 DIAGNOSIS — N958 Other specified menopausal and perimenopausal disorders: Secondary | ICD-10-CM | POA: Diagnosis not present

## 2023-10-28 DIAGNOSIS — M791 Myalgia, unspecified site: Secondary | ICD-10-CM | POA: Diagnosis not present

## 2023-10-28 DIAGNOSIS — M5416 Radiculopathy, lumbar region: Secondary | ICD-10-CM | POA: Diagnosis not present

## 2023-10-28 DIAGNOSIS — Z1330 Encounter for screening examination for mental health and behavioral disorders, unspecified: Secondary | ICD-10-CM | POA: Diagnosis not present

## 2023-10-28 DIAGNOSIS — Z981 Arthrodesis status: Secondary | ICD-10-CM | POA: Insufficient documentation

## 2023-10-28 DIAGNOSIS — M461 Sacroiliitis, not elsewhere classified: Secondary | ICD-10-CM | POA: Diagnosis not present

## 2023-10-28 DIAGNOSIS — M4322 Fusion of spine, cervical region: Secondary | ICD-10-CM | POA: Diagnosis not present

## 2023-11-02 ENCOUNTER — Ambulatory Visit (INDEPENDENT_AMBULATORY_CARE_PROVIDER_SITE_OTHER): Admitting: Family Medicine

## 2023-11-02 ENCOUNTER — Encounter: Payer: Self-pay | Admitting: Family Medicine

## 2023-11-02 VITALS — BP 120/72 | HR 80 | Temp 97.7°F | Ht 62.0 in | Wt 107.0 lb

## 2023-11-02 DIAGNOSIS — K6289 Other specified diseases of anus and rectum: Secondary | ICD-10-CM | POA: Diagnosis not present

## 2023-11-02 DIAGNOSIS — M51362 Other intervertebral disc degeneration, lumbar region with discogenic back pain and lower extremity pain: Secondary | ICD-10-CM

## 2023-11-02 DIAGNOSIS — F411 Generalized anxiety disorder: Secondary | ICD-10-CM | POA: Diagnosis not present

## 2023-11-02 MED ORDER — ALPRAZOLAM 0.5 MG PO TABS: ORAL_TABLET | ORAL | 0 refills | Status: DC

## 2023-11-02 MED ORDER — ALENDRONATE SODIUM 70 MG PO TABS: 70.0000 mg | ORAL_TABLET | ORAL | 11 refills | Status: DC

## 2023-11-02 NOTE — Progress Notes (Signed)
 Subjective:    Patient ID: Katelyn [name], female    DOB: 09/28/1945, 78 y.o.   MRN: [id number]  HPI Patient has a history of osteoporosis.  We had discontinued Fosamax  in the past however her recent bone density test that showed irritation.  T-score was -2.9.  Therefore the patient would like to resume Fosamax  70 mg once a week.  She also is dealing with severe pain in her back.  Her pain specialist is recommended what appears to be an intrathecal nerve stimulation device.  Patient states that the constant pain in her back is exacerbating her anxiety.  Previously she is taking 1-1/2 Xanax  a day.  Now take Xanax  3 times a day 25.  She feels extremely anxious, sometimes has trouble, feels overwhelmed.  The Xanax  helps to relax her.  She is trying to avoid more powerful pain medications but she believes extreme anxiety helps to manage her pain.  She is already on Cymbalta  60 mg a day.  Third, the patient reports a burning pain around the rectum when she defecates.  On visual examination today there is no external hemorrhoid.  There is no palpable mass.  There is no visible abnormality.  I am unable to appreciate any anal fissure.  Past Medical History:  Diagnosis Date   Anxiety    Arthritis    cervical and lumbar DDD   Heart murmur    Hemorrhoids    Hyperlipidemia    Hypertension    Migraines    Due to neck pain   Osteoporosis    Past Surgical History:  Procedure Laterality Date   BUNIONECTOMY     right   CERVICAL FUSION     x3   COLONOSCOPY     FLEXIBLE SIGMOIDOSCOPY N/A 01/14/2023   Procedure: FLEXIBLE SIGMOIDOSCOPY;  Surgeon: Asencion Blacksmith, MD;  Location: [facility];  Service: Gastroenterology;  Laterality: N/A;   HEMORRHOID BANDING     HEMOSTASIS CLIP PLACEMENT  01/14/2023   Procedure: HEMOSTASIS CLIP PLACEMENT;  Surgeon: Asencion Blacksmith, MD;  Location: [facility];  Service: Gastroenterology;;   LUMBAR DISC SURGERY     POLYPECTOMY     radiofrequency ablasion     SPINE  SURGERY      Dr. Faylene Hoots (x2, last Nov/15) C3-T4   UPPER GASTROINTESTINAL ENDOSCOPY     Current Outpatient Medications on File Prior to Visit  Medication Sig Dispense Refill   Ascorbic Acid (VITAMIN C) 500 MG tablet Take 500 mg by mouth daily.       aspirin  81 MG tablet Take 81 mg by mouth daily.       B Complex Vitamins (VITAMIN B COMPLEX PO) Take 1 tablet by mouth daily.       calcium carbonate (OS-CAL) 600 MG TABS Take 600 mg by mouth 2 (two) times daily with a meal.       cephALEXin  (KEFLEX ) 500 MG capsule Take 1 capsule (500 mg total) by mouth 3 (three) times daily. 21 capsule 0   diclofenac  sodium (VOLTAREN ) 1 % GEL Apply 2 g topically 4 (four) times daily.     DULoxetine  (CYMBALTA ) 30 MG capsule Take 1 capsule (30 mg total) by mouth 2 (two) times daily. 180 capsule 3   fish oil-omega-3 fatty acids 1000 MG capsule Take 1 g by mouth 2 (two) times daily.       Garlic 1000 MG CAPS Take 1 capsule by mouth daily.       hydrochlorothiazide  (HYDRODIURIL ) 25 MG tablet Take 1 tablet (  25 mg total) by mouth daily. 90 tablet 2   HYDROcodone-acetaminophen  (NORCO/VICODIN) 5-325 MG tablet      hydrocortisone  (ANUSOL -HC) 2.5 % rectal cream Place 1 Application rectally at bedtime. 28 g 1   Lysine 500 MG CAPS Take 1 capsule by mouth daily.       magnesium oxide (MAG-OX) 400 MG tablet Take 400 mg by mouth daily.       Multiple Vitamins-Minerals (MULTIVITAMIN WITH MINERALS) tablet Take 1 tablet by mouth daily.       nortriptyline  (PAMELOR ) 10 MG capsule Take 10 mg by mouth See admin instructions. Take two capsules by mouth (20 mg) every morning and three capsules by mouth (30 mg) every evening.     nystatin -triamcinolone  ointment (MYCOLOG) Apply 1 Application topically 2 (two) times daily. Apply to affected area BID 30 g 0   POTASSIUM GLUCONATE PO Take 1 tablet by mouth daily.       PRESCRIPTION MEDICATION Trigger point injections.  Once every 3 months.     simvastatin  (ZOCOR ) 20 MG tablet Take 1 tablet (20  mg total) by mouth at bedtime. 90 tablet 2   SUMAtriptan  (IMITREX ) 100 MG tablet PLEASE SEE ATTACHED FOR DETAILED DIRECTIONS 10 tablet 2   traMADol  (ULTRAM ) 50 MG tablet Take 50 mg by mouth every 8 (eight) hours as needed. for pain  0   vitamin E 400 UNIT capsule Take 400 Units by mouth daily.       No current facility-administered medications on file prior to visit.   Allergies  Allergen Reactions   Iodine Hives   Prednisone Hives    Pt took Benadryl  with Prednisone for surgery and did ok with it   Sulfa Antibiotics Swelling   Red Dye #40 (Allura Red) Rash   Social History   Socioeconomic History   Marital status: Married    Spouse name: Drexel Gentles   Number of children: 2   Years of education: [employer] education level: Associate degree: academic program  Occupational History   Occupation: Retired  Tobacco Use   Smoking status: Never   Smokeless tobacco: Never  Vaping Use   Vaping status: Never Used  Substance and Sexual Activity   Alcohol use: Yes    Alcohol/week: 7.0 standard drinks of alcohol    Types: 7 Glasses of wine per week    Comment: 1-2 rare   Drug use: No   Sexual activity: Yes  Other Topics Concern   Not on file  Social History Narrative   Lives with husband   Caffeine use: none   Social Drivers of Corporate investment banker Strain: Low Risk  (10/28/2023)   Received from [facility]   Overall Financial Resource Strain (CARDIA)    Difficulty of Paying Living Expenses: Not hard at all  Food Insecurity: No Food Insecurity (10/28/2023)   [facility]   Hunger Vital Sign    Worried About Running Out of Food in the Last Year: Never true    Ran Out of Food in the Last Year: Never true  Transportation Needs: No Transportation Needs (10/28/2023)   Received [facility] - Transportation    Lack of Transportation (Medical): No    Lack of Transportation (Non-Medical): No  Physical Activity: Sufficiently Active (07/03/2023)    Exercise Vital Sign    Days of Exercise per Week: 5 days    Minutes of Exercise per Session: 30 min  Stress: No Stress Concern Present (07/03/2023)  [employer] of Occupational Health - Occupational Stress Questionnaire    Feeling of Stress : Not at all  Social Connections: Moderately Integrated (07/03/2023)   Social Connection and Isolation Panel [NHANES]    Frequency of Communication with Friends and Family: More than three times a week    Frequency of Social Gatherings with Friends and Family: Twice a week    Attends Religious Services: More than 4 times per year    Active Member of [employer] or Organizations: No    Attends Engineer, structural: Not on file    Marital Status: Married  Intimate Partner Violence: Unknown (09/24/2021)   Received from [facility]   HITS    Physically Hurt: Not on file    Insult or Talk Down To: Not on file    Threaten Physical Harm: Not on file    Scream or Curse: Not on file   Family History  Problem Relation Age of Onset   Colon polyps Mother    Arthritis Mother    Hearing loss Mother    Hyperlipidemia Mother    Hypertension Mother    Stroke Mother    Heart disease Mother    Colon polyps Father    Arthritis Father    Heart disease Father    Hyperlipidemia Father    Arthritis Maternal Grandmother    Arthritis Maternal Grandfather    Arthritis Paternal Grandmother    Arthritis Paternal Grandfather    Colon cancer Neg Hx    Esophageal cancer Neg Hx    Stomach cancer Neg Hx    Rectal cancer Neg Hx    Breast cancer Neg Hx    BRCA 1/2 Neg Hx       Review of Systems  All other systems reviewed and are negative.      Objective:   Physical Exam Vitals reviewed.  Constitutional:      Appearance: She is well-developed.  HENT:     Head: Normocephalic and atraumatic.  Cardiovascular:     Rate and Rhythm: Normal rate and regular rhythm.     Heart sounds: No murmur heard.    No friction rub. No gallop.   Pulmonary:     Effort: Pulmonary effort is normal. No respiratory distress.     Breath sounds: Examination of the left-upper field reveals decreased breath sounds. Examination of the left-middle field reveals decreased breath sounds. Examination of the left-lower field reveals decreased breath sounds. Decreased breath sounds present. No wheezing, rhonchi or rales.  Chest:     Chest wall: No tenderness.  Genitourinary:    Rectum: Normal. No mass, tenderness, anal fissure or external hemorrhoid. Normal anal tone.  Skin:    General: Skin is warm and dry.     Coloration: Skin is not pale.     Findings: No erythema or rash.  Psychiatric:        Mood and Affect: Mood normal. Mood is not anxious.        Speech: Speech normal.        Behavior: Behavior normal.        Thought Content: Thought content normal.        Judgment: Judgment normal.           Assessment & Plan:  Degeneration of intervertebral disc of lumbar region with discogenic back pain and lower extremity pain  GAD (generalized anxiety disorder)  Rectal pain Rectal exam is unrevealing.  Patient may have a small fissure that I just could not see.  The other possibility would be nerve pain referred from her lower back.  I recommended that she try the suggestion of her pain clinic to treat her back pain to see if this would also help with burning pain around the rectum.  If not I would recommend ongoing GI for evaluation.  However recommend daily formal.  I am willing to increase Xanax  to 0.5 mg 3 times a day.  We discussed the risk of habituation and dependency.  We discussed switching Klonopin.  The patient states that she would like to try increasing the frequency of the Xanax  temporarily until hopefully her back pain is better.  I will give her 90 tablets to last 1 month.  Also cautioned her about dizziness and falls and confusion.

## 2023-11-30 DIAGNOSIS — K080 Exfoliation of teeth due to systemic causes: Secondary | ICD-10-CM | POA: Diagnosis not present

## 2023-12-15 ENCOUNTER — Encounter: Payer: Self-pay | Admitting: Physician Assistant

## 2023-12-15 ENCOUNTER — Ambulatory Visit: Admitting: Physician Assistant

## 2023-12-15 VITALS — BP 122/78 | HR 68 | Ht 62.0 in | Wt 107.0 lb

## 2023-12-15 DIAGNOSIS — M792 Neuralgia and neuritis, unspecified: Secondary | ICD-10-CM

## 2023-12-15 NOTE — Progress Notes (Signed)
 Chief Complaint: Anal itching and burning  HPI:    Katelyn [name] is a 78 year old female with a past medical history as listed below including anxiety, hypertension, osteoporosis and multiple others, known to Dr. Avram, who Vernal to clinic today with anal itching and burning.    06/24/2023 patient seen in clinic for banding.  Discussed recurrent bleeding, anal irritation and pruritus.  History of previous banding of left lateral and right posterior internal hemorrhoids July 2024 complicated by post banding bleed treated with sigmoidoscopy and clips.  She is moving her bowels well with Benefiber.  The right posterior internal hemorrhoid was banded.    Today, the patient starts by telling me that she has a bad back, she was told that the burning stinging pain that she has around her anus could be from her back but no one has ever given her anything to help with this.  She tells me that she went to see her PCP who did a rectal exam and saw no fissure. When she urinates sometimes it burns worse so she ends up putting Vaseline on all over the area prior to urinating so that it does not cause extra burning.  Her PCP also checked for UTI which was negative.  She has been using occasional Hydrocortisone  ointment to help with the pain not sure if it actually works or not.  She seems to go back and forth from Vaseline to this but the Vaseline is ruining my clothes.  Denies any constipation.  Does describe being on Prednisone last week which seemed to help with his burning pain for a couple of days but then it came back.    Patient tells me she has a office visit with her back doctor next week.    Denies fever, chills or weight loss.  Past Medical History:  Diagnosis Date   Anxiety    Arthritis    cervical and lumbar DDD   Heart murmur    Hemorrhoids    Hyperlipidemia    Hypertension    Migraines    Due to neck pain   Osteoporosis     Past Surgical History:  Procedure Laterality Date    BUNIONECTOMY     right   CERVICAL FUSION     x3   COLONOSCOPY     FLEXIBLE SIGMOIDOSCOPY N/A 01/14/2023   Procedure: FLEXIBLE SIGMOIDOSCOPY;  Surgeon: Aneita Gwendlyn DASEN, MD;  Location: [facility];  Service: Gastroenterology;  Laterality: N/A;   HEMORRHOID BANDING     HEMOSTASIS CLIP PLACEMENT  01/14/2023   Procedure: HEMOSTASIS CLIP PLACEMENT;  Surgeon: Aneita Gwendlyn DASEN, MD;  Location: [facility];  Service: Gastroenterology;;   LUMBAR DISC SURGERY     POLYPECTOMY     radiofrequency ablasion     SPINE SURGERY      Dr. Gust (x2, last Nov/15) C3-T4   UPPER GASTROINTESTINAL ENDOSCOPY      Current Outpatient Medications  Medication Sig Dispense Refill   alendronate  (FOSAMAX ) 70 MG tablet Take 1 tablet (70 mg total) by mouth every 7 (seven) days. Take with a full glass of water on an empty stomach. 4 tablet 11   ALPRAZolam  (XANAX ) 0.5 MG tablet TAKE 1 TABLET BY MOUTH THREE TIMES A DAY AS NEEDED FOR ANXIETY 90 tablet 0   Ascorbic Acid (VITAMIN C) 500 MG tablet Take 500 mg by mouth daily.       aspirin  81 MG tablet Take 81 mg by mouth daily.       B Complex Vitamins (  VITAMIN B COMPLEX PO) Take 1 tablet by mouth daily.       calcium carbonate (OS-CAL) 600 MG TABS Take 600 mg by mouth 2 (two) times daily with a meal.       cephALEXin  (KEFLEX ) 500 MG capsule Take 1 capsule (500 mg total) by mouth 3 (three) times daily. 21 capsule 0   diclofenac  sodium (VOLTAREN ) 1 % GEL Apply 2 g topically 4 (four) times daily.     DULoxetine  (CYMBALTA ) 30 MG capsule Take 1 capsule (30 mg total) by mouth 2 (two) times daily. 180 capsule 3   fish oil-omega-3 fatty acids 1000 MG capsule Take 1 g by mouth 2 (two) times daily.       Garlic 1000 MG CAPS Take 1 capsule by mouth daily.       hydrochlorothiazide  (HYDRODIURIL ) 25 MG tablet Take 1 tablet (25 mg total) by mouth daily. 90 tablet 2   HYDROcodone-acetaminophen  (NORCO/VICODIN) 5-325 MG tablet      hydrocortisone  (ANUSOL -HC) 2.5 % rectal cream Place 1  Application rectally at bedtime. 28 g 1   Lysine 500 MG CAPS Take 1 capsule by mouth daily.       magnesium oxide (MAG-OX) 400 MG tablet Take 400 mg by mouth daily.       Multiple Vitamins-Minerals (MULTIVITAMIN WITH MINERALS) tablet Take 1 tablet by mouth daily.       nortriptyline  (PAMELOR ) 10 MG capsule Take 10 mg by mouth See admin instructions. Take two capsules by mouth (20 mg) every morning and three capsules by mouth (30 mg) every evening.     nystatin -triamcinolone  ointment (MYCOLOG) Apply 1 Application topically 2 (two) times daily. Apply to affected area BID 30 g 0   POTASSIUM GLUCONATE PO Take 1 tablet by mouth daily.       PRESCRIPTION MEDICATION Trigger point injections.  Once every 3 months.     simvastatin  (ZOCOR ) 20 MG tablet Take 1 tablet (20 mg total) by mouth at bedtime. 90 tablet 2   SUMAtriptan  (IMITREX ) 100 MG tablet PLEASE SEE ATTACHED FOR DETAILED DIRECTIONS 10 tablet 2   traMADol  (ULTRAM ) 50 MG tablet Take 50 mg by mouth every 8 (eight) hours as needed. for pain  0   vitamin E 400 UNIT capsule Take 400 Units by mouth daily.       No current facility-administered medications for this visit.    Allergies as of 12/15/2023 - Review Complete 09/27/2023  Allergen Reaction Noted   Iodine Hives 08/12/2017   Prednisone Hives 10/23/2013   Sulfa antibiotics Swelling 08/12/2017   Red dye #40 (allura red) Rash 05/31/2014    Family History  Problem Relation Age of Onset   Colon polyps Mother    Arthritis Mother    Hearing loss Mother    Hyperlipidemia Mother    Hypertension Mother    Stroke Mother    Heart disease Mother    Colon polyps Father    Arthritis Father    Heart disease Father    Hyperlipidemia Father    Arthritis Maternal Grandmother    Arthritis Maternal Grandfather    Arthritis Paternal Grandmother    Arthritis Paternal Grandfather    Colon cancer Neg Hx    Esophageal cancer Neg Hx    Stomach cancer Neg Hx    Rectal cancer Neg Hx    Breast cancer  Neg Hx    BRCA 1/2 Neg Hx     Social History   Socioeconomic History   Marital status: Married  Spouse name: Fairy   Number of children: 2   Years of education: College   Highest education level: Associate degree: academic program  Occupational History   Occupation: Retired  Tobacco Use   Smoking status: Never   Smokeless tobacco: Never  Vaping Use   Vaping status: Never Used  Substance and Sexual Activity   Alcohol use: Yes    Alcohol/week: 7.0 standard drinks of alcohol    Types: 7 Glasses of wine per week    Comment: 1-2 rare   Drug use: No   Sexual activity: Yes  Other Topics Concern   Not on file  Social History Narrative   Lives with husband   Caffeine use: none   Social Drivers of Corporate investment banker Strain: Low Risk  (10/28/2023)   Received from [facility]   Overall Financial Resource Strain (CARDIA)    Difficulty of Paying Living Expenses: Not hard at all  Food Insecurity: No Food Insecurity (10/28/2023)   Received from [facility]   Hunger Vital Sign    Within the past 12 months, you worried that your food would run out before you got the money to buy more.: Never true    Within the past 12 months, the food you bought just didn't last and you didn't have money to get more.: Never true  Transportation Needs: No Transportation Needs (10/28/2023)   Received from [facility] - Transportation    Lack of Transportation (Medical): No    Lack of Transportation (Non-Medical): No  Physical Activity: Sufficiently Active (07/03/2023)   Exercise Vital Sign    Days of Exercise per Week: 5 days    Minutes of Exercise per Session: 30 min  Stress: No Stress Concern Present (07/03/2023)   [employer] of Occupational Health - Occupational Stress Questionnaire    Feeling of Stress : Not at all  Social Connections: Moderately Integrated (07/03/2023)   Social Connection and Isolation Panel    Frequency of Communication with Friends and Family:  More than three times a week    Frequency of Social Gatherings with Friends and Family: Twice a week    Attends Religious Services: More than 4 times per year    Active Member of [employer] or Organizations: No    Attends Engineer, structural: Not on file    Marital Status: Married  Intimate Partner Violence: Unknown (09/24/2021)   Received from [facility]   HITS    Physically Hurt: Not on file    Insult or Talk Down To: Not on file    Threaten Physical Harm: Not on file    Scream or Curse: Not on file    Review of Systems:    Constitutional: No weight loss, fever or chills Cardiovascular: No chest pain Respiratory: No SOB Gastrointestinal: See HPI and otherwise negative   Physical Exam:  Vital signs: BP 122/78   Pulse 68   Ht 5' 2 (1.575 m)   Wt 107 lb (48.5 kg)   BMI 19.57 kg/m    Constitutional:   Pleasant elderly Caucasian female appears to be in NAD, Well developed, Well nourished, alert and cooperative Respiratory: Respirations even and unlabored. Lungs clear to auscultation bilaterally.   No wheezes, crackles, or rhonchi.  Cardiovascular: Normal S1, S2. No MRG. Regular rate and rhythm. No peripheral edema, cyanosis or pallor.  Gastrointestinal:  Soft, nondistended, nontender. No rebound or guarding. Normal bowel sounds. No appreciable masses or hepatomegaly. Rectal: External: Small external tag; internal: No  abnormality; anoscopy: No abnormality (patient relays a burning tingly pain on her buttocks surrounding her anus, not her actual anus) Psychiatric:  Demonstrates good judgement and reason without abnormal affect or behaviors.  RELEVANT LABS AND IMAGING: CBC    Component Value Date/Time   WBC 5.2 01/14/2023 1045   RBC 4.43 01/14/2023 1045   HGB 14.0 01/14/2023 1045   HCT 42.7 01/14/2023 1045   PLT 291 01/14/2023 1045   MCV 96.4 01/14/2023 1045   MCH 31.6 01/14/2023 1045   MCHC 32.8 01/14/2023 1045   RDW 12.5 01/14/2023 1045   LYMPHSABS 1.4 01/14/2023  1045   MONOABS 0.5 01/14/2023 1045   EOSABS 0.1 01/14/2023 1045   BASOSABS 0.0 01/14/2023 1045    CMP     Component Value Date/Time   NA 137 01/14/2023 1045   K 3.9 01/14/2023 1045   CL 98 01/14/2023 1045   CO2 30 01/14/2023 1045   GLUCOSE 67 (L) 01/14/2023 1045   BUN 10 01/14/2023 1045   CREATININE 0.61 01/14/2023 1045   CREATININE 0.63 11/10/2021 1522   CALCIUM 9.9 01/14/2023 1045   PROT 6.3 02/19/2023 0851   ALBUMIN 3.9 04/03/2022 1819   AST 27 02/19/2023 0851   ALT 30 (H) 02/19/2023 0851   ALKPHOS 60 04/03/2022 1819   BILITOT 0.4 02/19/2023 0851   GFRNONAA >60 01/14/2023 1045   GFRNONAA 81 02/12/2020 0901   GFRAA 94 02/12/2020 0901    Assessment: 1.  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  Discussed with patient that she should stop using the Hydrocortisone  ointment all the time as it does not really help and can thin the rectal tissue.  She can continue Vaseline as needed.  May want to wear underwear if this is ruining her clothes. 2.  Recommend the patient discuss this neuropathic pain with her orthopedic doctor next week when she follows with them. 3.  Did briefly discuss with Dr. Avram who recommends she follow-up with her back doctor.  Katelyn Failing, PA-C [facility] 12/15/2023, 2:15 PM  Cc: [name] DASEN, MD

## 2023-12-21 ENCOUNTER — Other Ambulatory Visit: Payer: Self-pay | Admitting: Family Medicine

## 2023-12-23 DIAGNOSIS — M791 Myalgia, unspecified site: Secondary | ICD-10-CM | POA: Diagnosis not present

## 2023-12-23 DIAGNOSIS — M5416 Radiculopathy, lumbar region: Secondary | ICD-10-CM | POA: Diagnosis not present

## 2024-01-03 DIAGNOSIS — M5416 Radiculopathy, lumbar region: Secondary | ICD-10-CM | POA: Diagnosis not present

## 2024-02-06 ENCOUNTER — Other Ambulatory Visit: Payer: Self-pay | Admitting: Family Medicine

## 2024-02-06 DIAGNOSIS — G894 Chronic pain syndrome: Secondary | ICD-10-CM

## 2024-02-06 DIAGNOSIS — G43009 Migraine without aura, not intractable, without status migrainosus: Secondary | ICD-10-CM

## 2024-02-08 DIAGNOSIS — H2513 Age-related nuclear cataract, bilateral: Secondary | ICD-10-CM | POA: Diagnosis not present

## 2024-02-08 NOTE — Telephone Encounter (Signed)
 Requested medications are due for refill today.  yes  Requested medications are on the active medications list.  yes  Last refill. 12/23/2023 #90 0 rf  Future visit scheduled.   yes  Notes to clinic.  Refill not delegated.    Requested Prescriptions  Pending Prescriptions Disp Refills   ALPRAZolam  (XANAX ) 0.5 MG tablet [Pharmacy Med Name: ALPRAZolam  0.5 MG Oral Tablet] 90 tablet 0    Sig: TAKE 1 TABLET BY MOUTH THREE TIMES DAILY AS NEEDED FOR ANXIETY     Not Delegated - Psychiatry: Anxiolytics/Hypnotics 2 Failed - 02/08/2024  4:28 PM      Failed - This refill cannot be delegated      Failed - Urine Drug Screen completed in last 360 days      Passed - Patient is not pregnant      Passed - Valid encounter within last 6 months    Recent Outpatient Visits           3 months ago Degeneration of intervertebral disc of lumbar region with discogenic back pain and lower extremity pain   [place] [facility] Pickard, Butler DASEN, MD   4 months ago Dysuria   [place] [facility] Aletha Bene, MD   7 months ago Atrophic vaginitis   [place] [facility] [name] DASEN, MD   11 months ago Osteoporosis without current pathological fracture, unspecified osteoporosis type   [place] [facility] Pickard, Butler DASEN, MD   1 year ago Dysuria   [place] [facility] Duanne, Butler DASEN, MD               SUMAtriptan  (IMITREX ) 100 MG tablet [Pharmacy Med Name: SUMAtriptan  Succinate 100 MG Oral Tablet] 10 tablet 0    Sig: TAKE ONE TABLET BY MOUTH EVERY 2 HOURS AS NEEDED FOR MIGRAINE. MAY REPEAT IN 2 HOURS IF HEADACHE PERSISTS OR RECURS.     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

## 2024-02-10 ENCOUNTER — Other Ambulatory Visit: Payer: Self-pay | Admitting: Family Medicine

## 2024-02-10 ENCOUNTER — Telehealth: Payer: Self-pay

## 2024-02-10 DIAGNOSIS — G43009 Migraine without aura, not intractable, without status migrainosus: Secondary | ICD-10-CM

## 2024-02-10 DIAGNOSIS — G894 Chronic pain syndrome: Secondary | ICD-10-CM

## 2024-02-10 MED ORDER — SUMATRIPTAN SUCCINATE 100 MG PO TABS: 100.0000 mg | ORAL_TABLET | ORAL | 2 refills | Status: DC | PRN

## 2024-02-10 MED ORDER — ALPRAZOLAM 0.5 MG PO TABS: 0.5000 mg | ORAL_TABLET | Freq: Three times a day (TID) | ORAL | 0 refills | Status: DC | PRN

## 2024-02-10 NOTE — Telephone Encounter (Signed)
 Copied from CRM [id number]. Topic: Clinical - Prescription Issue >> Feb 10, 2024  9:16 AM Katelyn [name] wrote: Reason for CRM: pt called asking about her prescription refills of xanax  and Imitrex .  She called in a refill on the 17th and called the pharmacy and they told her they have not heard back.  Please advise. [phone]

## 2024-02-17 DIAGNOSIS — M48062 Spinal stenosis, lumbar region with neurogenic claudication: Secondary | ICD-10-CM | POA: Diagnosis not present

## 2024-02-17 DIAGNOSIS — M5416 Radiculopathy, lumbar region: Secondary | ICD-10-CM | POA: Diagnosis not present

## 2024-02-17 DIAGNOSIS — M791 Myalgia, unspecified site: Secondary | ICD-10-CM | POA: Diagnosis not present

## 2024-02-17 DIAGNOSIS — G894 Chronic pain syndrome: Secondary | ICD-10-CM | POA: Diagnosis not present

## 2024-02-24 ENCOUNTER — Encounter: Payer: Self-pay | Admitting: Family Medicine

## 2024-02-24 ENCOUNTER — Ambulatory Visit: Payer: Medicare Other

## 2024-02-24 ENCOUNTER — Ambulatory Visit: Payer: Medicare Other | Admitting: Family Medicine

## 2024-02-24 VITALS — BP 110/62 | HR 72 | Temp 97.5°F | Ht 62.0 in | Wt 103.6 lb

## 2024-02-24 DIAGNOSIS — M51369 Other intervertebral disc degeneration, lumbar region without mention of lumbar back pain or lower extremity pain: Secondary | ICD-10-CM

## 2024-02-24 DIAGNOSIS — G894 Chronic pain syndrome: Secondary | ICD-10-CM

## 2024-02-24 DIAGNOSIS — R5383 Other fatigue: Secondary | ICD-10-CM | POA: Diagnosis not present

## 2024-02-24 DIAGNOSIS — M810 Age-related osteoporosis without current pathological fracture: Secondary | ICD-10-CM

## 2024-02-24 DIAGNOSIS — K080 Exfoliation of teeth due to systemic causes: Secondary | ICD-10-CM | POA: Diagnosis not present

## 2024-02-24 DIAGNOSIS — L821 Other seborrheic keratosis: Secondary | ICD-10-CM | POA: Insufficient documentation

## 2024-02-24 DIAGNOSIS — L578 Other skin changes due to chronic exposure to nonionizing radiation: Secondary | ICD-10-CM | POA: Insufficient documentation

## 2024-02-24 DIAGNOSIS — Z0000 Encounter for general adult medical examination without abnormal findings: Secondary | ICD-10-CM

## 2024-02-24 DIAGNOSIS — N6459 Other signs and symptoms in breast: Secondary | ICD-10-CM | POA: Insufficient documentation

## 2024-02-24 DIAGNOSIS — Z0001 Encounter for general adult medical examination with abnormal findings: Secondary | ICD-10-CM | POA: Diagnosis not present

## 2024-02-24 DIAGNOSIS — L814 Other melanin hyperpigmentation: Secondary | ICD-10-CM | POA: Insufficient documentation

## 2024-02-24 DIAGNOSIS — Z13220 Encounter for screening for lipoid disorders: Secondary | ICD-10-CM | POA: Diagnosis not present

## 2024-02-24 DIAGNOSIS — D2370 Other benign neoplasm of skin of unspecified lower limb, including hip: Secondary | ICD-10-CM | POA: Insufficient documentation

## 2024-02-24 DIAGNOSIS — Z23 Encounter for immunization: Secondary | ICD-10-CM

## 2024-02-24 NOTE — Progress Notes (Signed)
 Subjective:    Patient ID: Katelyn [name], female    DOB: Mar 30, 1946, 78 y.o.   MRN: [id number]   Patient is here today for complete physical exam.   Due for shingrix  #2, flu, covid, and rsv.  Agrees to shingrix  #2.    Immunization History  Administered Date(s) Administered   Fluad Quad(high Dose 65+) 03/21/2022   INFLUENZA, HIGH DOSE SEASONAL PF 02/02/2019, 02/06/2023   PFIZER Comirnaty(Gray Top)Covid-19 Tri-Sucrose Vaccine 08/14/2019, 09/04/2019, 03/19/2020   PFIZER(Purple Top)SARS-COV-2 Vaccination 08/14/2019, 09/04/2019, 03/19/2020   PNEUMOCOCCAL CONJUGATE-20 07/29/2020   Pneumococcal Conjugate-13 10/17/2015   Pneumococcal Polysaccharide-23 09/24/2014   Unspecified SARS-COV-2 Vaccination 08/14/2019, 03/21/2022   Zoster Recombinant(Shingrix ) 07/29/2020   Zoster, Live 06/21/2013   Colonoscopy was performed 2024 showed 7 polyps some of which were tubular adenomas.  They recommended repeat colonoscopy in 2027.  Her mammogram was performed 5/25 was normal.  Dexa 5/25 showed T score of -2.9.  It was -3.0 in 2021.  So it is stable.  Has been of fosamax  for > 5 years but is now off due to bisphosphonate holiday.   Past Medical History:  Diagnosis Date   Anxiety    Arthritis    cervical and lumbar DDD   Heart murmur    Hemorrhoids    Hyperlipidemia    Hypertension    Migraines    Due to neck pain   Osteoporosis    Past Surgical History:  Procedure Laterality Date   BUNIONECTOMY     right   CERVICAL FUSION     x3   COLONOSCOPY     FLEXIBLE SIGMOIDOSCOPY N/A 01/14/2023   Procedure: FLEXIBLE SIGMOIDOSCOPY;  Surgeon: Aneita Gwendlyn DASEN, MD;  Location: [facility];  Service: Gastroenterology;  Laterality: N/A;   HEMORRHOID BANDING     HEMOSTASIS CLIP PLACEMENT  01/14/2023   Procedure: HEMOSTASIS CLIP PLACEMENT;  Surgeon: Aneita Gwendlyn DASEN, MD;  Location: [facility];  Service: Gastroenterology;;   LUMBAR DISC SURGERY     POLYPECTOMY     radiofrequency ablasion     SPINE  SURGERY      Dr. Gust (x2, last Nov/15) C3-T4   UPPER GASTROINTESTINAL ENDOSCOPY     Current Outpatient Medications on File Prior to Visit  Medication Sig Dispense Refill   alendronate  (FOSAMAX ) 70 MG tablet Take 1 tablet (70 mg total) by mouth every 7 (seven) days. Take with a full glass of water on an empty stomach. 4 tablet 11   ALPRAZolam  (XANAX ) 0.5 MG tablet Take 1 tablet (0.5 mg total) by mouth 3 (three) times daily as needed. for anxiety 90 tablet 0   Ascorbic Acid (VITAMIN C) 500 MG tablet Take 500 mg by mouth daily.       aspirin  81 MG tablet Take 81 mg by mouth daily.       B Complex Vitamins (VITAMIN B COMPLEX PO) Take 1 tablet by mouth daily.       calcium carbonate (OS-CAL) 600 MG TABS Take 600 mg by mouth 2 (two) times daily with a meal.       cephALEXin  (KEFLEX ) 500 MG capsule Take 1 capsule (500 mg total) by mouth 3 (three) times daily. 21 capsule 0   diclofenac  sodium (VOLTAREN ) 1 % GEL Apply 2 g topically 4 (four) times daily.     DULoxetine  (CYMBALTA ) 30 MG capsule Take 1 capsule (30 mg total) by mouth 2 (two) times daily. 180 capsule 3   fish oil-omega-3 fatty acids 1000 MG capsule Take 1 g by mouth 2 (  two) times daily.       Garlic 1000 MG CAPS Take 1 capsule by mouth daily.       hydrochlorothiazide  (HYDRODIURIL ) 25 MG tablet Take 1 tablet (25 mg total) by mouth daily. 90 tablet 2   HYDROcodone-acetaminophen  (NORCO/VICODIN) 5-325 MG tablet      hydrocortisone  (ANUSOL -HC) 2.5 % rectal cream Place 1 Application rectally at bedtime. 28 g 1   Lysine 500 MG CAPS Take 1 capsule by mouth daily.       magnesium oxide (MAG-OX) 400 MG tablet Take 400 mg by mouth daily.       Multiple Vitamins-Minerals (MULTIVITAMIN WITH MINERALS) tablet Take 1 tablet by mouth daily.       nortriptyline  (PAMELOR ) 10 MG capsule Take 10 mg by mouth See admin instructions. Take two capsules by mouth (20 mg) every morning and three capsules by mouth (30 mg) every evening.     nystatin -triamcinolone   ointment (MYCOLOG) Apply 1 Application topically 2 (two) times daily. Apply to affected area BID 30 g 0   POTASSIUM GLUCONATE PO Take 1 tablet by mouth daily.       PRESCRIPTION MEDICATION Trigger point injections.  Once every 3 months.     simvastatin  (ZOCOR ) 20 MG tablet Take 1 tablet (20 mg total) by mouth at bedtime. 90 tablet 2   SUMAtriptan  (IMITREX ) 100 MG tablet Take 1 tablet (100 mg total) by mouth every 2 (two) hours as needed for migraine. May repeat in 2 hours if headache persists or recurs. 10 tablet 2   traMADol  (ULTRAM ) 50 MG tablet Take 50 mg by mouth every 8 (eight) hours as needed. for pain  0   vitamin E 400 UNIT capsule Take 400 Units by mouth daily.       No current facility-administered medications on file prior to visit.   Allergies  Allergen Reactions   Iodine Hives   Prednisone Hives    Pt took Benadryl  with Prednisone for surgery and did ok with it   Sulfa Antibiotics Swelling   Red Dye #40 (Allura Red) Rash   Social History   Socioeconomic History   Marital status: Married    Spouse name: Fairy   Number of children: 2   Years of education: [employer] education level: Associate degree: occupational, Scientist, product/process development, or vocational program  Occupational History   Occupation: Retired  Tobacco Use   Smoking status: Never   Smokeless tobacco: Never  Vaping Use   Vaping status: Never Used  Substance and Sexual Activity   Alcohol use: Yes    Alcohol/week: 7.0 standard drinks of alcohol    Types: 7 Glasses of wine per week    Comment: 1-2 rare   Drug use: No   Sexual activity: Yes  Other Topics Concern   Not on file  Social History Narrative   Lives with husband   Caffeine use: none   Social Drivers of Corporate investment banker Strain: Low Risk  (02/21/2024)   Overall Financial Resource Strain (CARDIA)    Difficulty of Paying Living Expenses: Not hard at all  Food Insecurity: No Food Insecurity (02/21/2024)   Hunger Vital Sign    Worried About  Running Out of Food in the Last Year: Never true    Ran Out of Food in the Last Year: Never true  Transportation Needs: No Transportation Needs (02/21/2024)   PRAPARE - Administrator, Civil Service (Medical): No    Lack of Transportation (Non-Medical): No  Physical  Activity: Sufficiently Active (02/21/2024)   Exercise Vital Sign    Days of Exercise per Week: 7 days    Minutes of Exercise per Session: 30 min  Stress: No Stress Concern Present (02/21/2024)   [employer] of Occupational Health - Occupational Stress Questionnaire    Feeling of Stress: Not at all  Social Connections: Unknown (02/21/2024)   Social Connection and Isolation Panel    Frequency of Communication with Friends and Family: More than three times a week    Frequency of Social Gatherings with Friends and Family: Three times a week    Attends Religious Services: More than 4 times per year    Active Member of Clubs or Organizations: Patient declined    Attends Banker Meetings: Not on file    Marital Status: Married  Intimate Partner Violence: Unknown (09/24/2021)   Received from [facility]   HITS    Physically Hurt: Not on file    Insult or Talk Down To: Not on file    Threaten Physical Harm: Not on file    Scream or Curse: Not on file   Family History  Problem Relation Age of Onset   Colon polyps Mother    Arthritis Mother    Hearing loss Mother    Hyperlipidemia Mother    Hypertension Mother    Stroke Mother    Heart disease Mother    Colon polyps Father    Arthritis Father    Heart disease Father    Hyperlipidemia Father    Arthritis Maternal Grandmother    Arthritis Maternal Grandfather    Arthritis Paternal Grandmother    Arthritis Paternal Grandfather    Colon cancer Neg Hx    Esophageal cancer Neg Hx    Stomach cancer Neg Hx    Rectal cancer Neg Hx    Breast cancer Neg Hx    BRCA 1/2 Neg Hx       Review of Systems  All other systems reviewed and are  negative.      Objective:   Physical Exam Vitals reviewed.  Constitutional:      General: She is not in acute distress.    Appearance: She is well-developed. She is not diaphoretic.  HENT:     Head: Normocephalic and atraumatic.     Right Ear: External ear normal.     Left Ear: External ear normal.     Nose: Nose normal.     Mouth/Throat:     Pharynx: No oropharyngeal exudate.  Eyes:     General: No scleral icterus.       Right eye: No discharge.        Left eye: No discharge.     Conjunctiva/sclera: Conjunctivae normal.     Pupils: Pupils are equal, round, and reactive to light.  Neck:     Thyroid : No thyromegaly.     Vascular: No JVD.     Trachea: No tracheal deviation.  Cardiovascular:     Rate and Rhythm: Normal rate and regular rhythm.     Heart sounds: No murmur heard.    No friction rub. No gallop.  Pulmonary:     Effort: Pulmonary effort is normal. No respiratory distress.     Breath sounds: Normal breath sounds. No stridor. No wheezing or rales.  Chest:     Chest wall: No tenderness.  Abdominal:     General: Bowel sounds are normal. There is no distension.     Palpations: Abdomen is soft. There is no  mass.     Tenderness: There is no abdominal tenderness. There is no guarding or rebound.  Musculoskeletal:        General: No tenderness. Normal range of motion.     Cervical back: Normal range of motion and neck supple.  Lymphadenopathy:     Cervical: No cervical adenopathy.  Skin:    General: Skin is warm.     Coloration: Skin is not pale.     Findings: No erythema or rash.  Neurological:     Mental Status: She is alert and oriented to person, place, and time.     Cranial Nerves: No cranial nerve deficit.     Motor: No abnormal muscle tone.     Coordination: Coordination normal.     Deep Tendon Reflexes: Reflexes are normal and symmetric.  Psychiatric:        Behavior: Behavior normal.        Thought Content: Thought content normal.        Judgment:  Judgment normal.           Assessment & Plan:  Routine general medical examination at a health care facility - Plan: CBC with Differential/Platelet, Comprehensive metabolic panel with GFR, Lipid panel, TSH  Screening cholesterol level - Plan: CBC with Differential/Platelet, Comprehensive metabolic panel with GFR, Lipid panel, TSH  Osteoporosis without current pathological fracture, unspecified osteoporosis type - Plan: CBC with Differential/Platelet, Comprehensive metabolic panel with GFR, Lipid panel, TSH  Degeneration of intervertebral disc of lumbar region, unspecified whether pain present  Chronic pain syndrome  Need for shingles vaccine - Plan: Varicella-zoster vaccine IM Patient received shingrix  #2.  Recommended flu, covid, and rsv.  Check cbc, cmp, flp, and tsh.  Stay off fosamax  given greater than 5 years of therapy.  Recheck dexa every 2 years and resume if dexa declines.  Cancer screening utd.  Continue calcium and vit d.

## 2024-02-25 ENCOUNTER — Ambulatory Visit: Payer: Self-pay | Admitting: Family Medicine

## 2024-02-25 LAB — CBC WITH DIFFERENTIAL/PLATELET
Absolute Lymphocytes: 1269 {cells}/uL (ref 850–3900)
Absolute Monocytes: 599 {cells}/uL (ref 200–950)
Basophils Absolute: 11 {cells}/uL (ref 0–200)
Basophils Relative: 0.2 %
Eosinophils Absolute: 22 {cells}/uL (ref 15–500)
Eosinophils Relative: 0.4 %
HCT: 41.6 % (ref 35.0–45.0)
Hemoglobin: 13.9 g/dL (ref 11.7–15.5)
MCH: 31.7 pg (ref 27.0–33.0)
MCHC: 33.4 g/dL (ref 32.0–36.0)
MCV: 94.8 fL (ref 80.0–100.0)
MPV: 10.3 fL (ref 7.5–12.5)
Monocytes Relative: 11.1 %
Neutro Abs: 3499 {cells}/uL (ref 1500–7800)
Neutrophils Relative %: 64.8 %
Platelets: 282 Thousand/uL (ref 140–400)
RBC: 4.39 Million/uL (ref 3.80–5.10)
RDW: 12.4 % (ref 11.0–15.0)
Total Lymphocyte: 23.5 %
WBC: 5.4 Thousand/uL (ref 3.8–10.8)

## 2024-02-25 LAB — COMPREHENSIVE METABOLIC PANEL WITH GFR
AG Ratio: 2.3 (calc) (ref 1.0–2.5)
ALT: 26 U/L (ref 6–29)
AST: 19 U/L (ref 10–35)
Albumin: 4.5 g/dL (ref 3.6–5.1)
Alkaline phosphatase (APISO): 58 U/L (ref 37–153)
BUN: 11 mg/dL (ref 7–25)
CO2: 29 mmol/L (ref 20–32)
Calcium: 9.8 mg/dL (ref 8.6–10.4)
Chloride: 95 mmol/L — ABNORMAL LOW (ref 98–110)
Creat: 0.64 mg/dL (ref 0.60–1.00)
Globulin: 2 g/dL (ref 1.9–3.7)
Glucose, Bld: 106 mg/dL — ABNORMAL HIGH (ref 65–99)
Potassium: 4.3 mmol/L (ref 3.5–5.3)
Sodium: 132 mmol/L — ABNORMAL LOW (ref 135–146)
Total Bilirubin: 0.5 mg/dL (ref 0.2–1.2)
Total Protein: 6.5 g/dL (ref 6.1–8.1)
eGFR: 92 mL/min/1.73m2 (ref >=60)

## 2024-02-25 LAB — LIPID PANEL
Cholesterol: 261 mg/dL — ABNORMAL HIGH (ref <200)
HDL: 129 mg/dL (ref >=50)
LDL Cholesterol (Calc): 117 mg/dL — ABNORMAL HIGH
Non-HDL Cholesterol (Calc): 132 mg/dL — ABNORMAL HIGH (ref <130)
Total CHOL/HDL Ratio: 2 (calc) (ref <5.0)
Triglycerides: 61 mg/dL (ref <150)

## 2024-02-25 LAB — TSH: TSH: 0.52 m[IU]/L (ref 0.40–4.50)

## 2024-03-01 DIAGNOSIS — K080 Exfoliation of teeth due to systemic causes: Secondary | ICD-10-CM | POA: Diagnosis not present

## 2024-03-06 ENCOUNTER — Other Ambulatory Visit: Payer: Self-pay | Admitting: Family Medicine

## 2024-03-07 NOTE — Telephone Encounter (Signed)
 Requested Prescriptions  Pending Prescriptions Disp Refills   hydrochlorothiazide  (HYDRODIURIL ) 25 MG tablet [Pharmacy Med Name: hydroCHLOROthiazide  25 MG Oral Tablet] 180 tablet 0    Sig: Take 1 tablet by mouth once daily     Cardiovascular: Diuretics - Thiazide Failed - 03/07/2024  1:24 PM      Failed - Na in normal range and within 180 days    Sodium  Date Value Ref Range Status  02/24/2024 132 (L) 135 - 146 mmol/L Final         Passed - Cr in normal range and within 180 days    Creat  Date Value Ref Range Status  02/24/2024 0.64 0.60 - 1.00 mg/dL Final         Passed - K in normal range and within 180 days    Potassium  Date Value Ref Range Status  02/24/2024 4.3 3.5 - 5.3 mmol/L Final         Passed - Last BP in normal range    BP Readings from Last 1 Encounters:  02/24/24 110/62         Passed - Valid encounter within last 6 months    Recent Outpatient Visits           1 week ago Routine general medical examination at a health care facility   [place] [facility] [name] DASEN, MD   4 months ago Degeneration of intervertebral disc of lumbar region with discogenic back pain and lower extremity pain   [place] [facility] [name] DASEN, MD   5 months ago Dysuria   [place] [facility] Aletha Bene, MD   8 months ago Atrophic vaginitis   [place] [facility] Duanne, Butler DASEN, MD   1 year ago Osteoporosis without current pathological fracture, unspecified osteoporosis type   [place] [facility] Pickard, Butler DASEN, MD

## 2024-03-22 DIAGNOSIS — G894 Chronic pain syndrome: Secondary | ICD-10-CM | POA: Diagnosis not present

## 2024-03-24 ENCOUNTER — Other Ambulatory Visit: Payer: Self-pay | Admitting: Family Medicine

## 2024-03-24 DIAGNOSIS — E785 Hyperlipidemia, unspecified: Secondary | ICD-10-CM

## 2024-03-24 NOTE — Telephone Encounter (Signed)
 Requested Prescriptions  Pending Prescriptions Disp Refills   ALPRAZolam  (XANAX ) 0.5 MG tablet [Pharmacy Med Name: ALPRAZolam  0.5 MG Oral Tablet] 90 tablet 0    Sig: TAKE 1 TABLET BY MOUTH THREE TIMES DAILY AS NEEDED FOR ANXIETY     Not Delegated - Psychiatry: Anxiolytics/Hypnotics 2 Failed - 03/24/2024  5:25 PM      Failed - This refill cannot be delegated      Failed - Urine Drug Screen completed in last 360 days      Passed - Patient is not pregnant      Passed - Valid encounter within last 6 months    Recent Outpatient Visits           4 weeks ago Routine general medical examination at a health care facility   [place] [facility] [name] DASEN, MD   4 months ago Degeneration of intervertebral disc of lumbar region with discogenic back pain and lower extremity pain   [place] [facility] Duanne, Butler DASEN, MD   5 months ago Dysuria   [place] [facility] Aletha Bene, MD   8 months ago Atrophic vaginitis   [place] [facility] [name] DASEN, MD   1 year ago Osteoporosis without current pathological fracture, unspecified osteoporosis type   [place] [facility] Pickard, Butler DASEN, MD               simvastatin  (ZOCOR ) 20 MG tablet [Pharmacy Med Name: Simvastatin  20 MG Oral Tablet] 90 tablet 0    Sig: TAKE 1 TABLET BY MOUTH ONCE DAILY AT BEDTIME     Cardiovascular:  Antilipid - Statins Failed - 03/24/2024  5:25 PM      Failed - Lipid Panel in normal range within the last 12 months    Cholesterol  Date Value Ref Range Status  02/24/2024 261 (H) <200 mg/dL Final   LDL Cholesterol (Calc)  Date Value Ref Range Status  02/24/2024 117 (H) mg/dL (calc) Final    Comment:    Reference range: <100 . Desirable range <100 mg/dL for primary prevention;   <70 mg/dL for patients with CHD or diabetic patients  with > or = 2 CHD risk factors. SABRA LDL-C is now calculated  using the Martin-Hopkins  calculation, which is a validated novel method providing  better accuracy than the Friedewald equation in the  estimation of LDL-C.  Gladis APPLETHWAITE et al. SANDREA. 7986;689(80): 2061-2068  (http://education.[facility].com/faq/FAQ164)    HDL  Date Value Ref Range Status  02/24/2024 129 > OR = 50 mg/dL Final   Triglycerides  Date Value Ref Range Status  02/24/2024 61 <150 mg/dL Final         Passed - Patient is not pregnant      Passed - Valid encounter within last 12 months    Recent Outpatient Visits           4 weeks ago Routine general medical examination at a health care facility   [place] [facility] [name] DASEN, MD   4 months ago Degeneration of intervertebral disc of lumbar region with discogenic back pain and lower extremity pain   [place] [facility] [name] DASEN, MD   5 months ago Dysuria   [place] [facility] Aletha Bene, MD   8 months ago Atrophic vaginitis    [facility] Pickard, Butler DASEN, MD  1 year ago Osteoporosis without current pathological fracture, unspecified osteoporosis type   [place] [facility] Pickard, Butler DASEN, MD

## 2024-03-24 NOTE — Telephone Encounter (Signed)
 Requested medications are due for refill today.  yes  Requested medications are on the active medications list.  yes  Last refill. 02/10/2024 #90 0 rf  Future visit scheduled.   Next year  Notes to clinic.  Refill not delegated.    Requested Prescriptions  Pending Prescriptions Disp Refills   ALPRAZolam  (XANAX ) 0.5 MG tablet [Pharmacy Med Name: ALPRAZolam  0.5 MG Oral Tablet] 90 tablet 0    Sig: TAKE 1 TABLET BY MOUTH THREE TIMES DAILY AS NEEDED FOR ANXIETY     Not Delegated - Psychiatry: Anxiolytics/Hypnotics 2 Failed - 03/24/2024  5:25 PM      Failed - This refill cannot be delegated      Failed - Urine Drug Screen completed in last 360 days      Passed - Patient is not pregnant      Passed - Valid encounter within last 6 months    Recent Outpatient Visits           4 weeks ago Routine general medical examination at a health care facility   [place] [facility] [name] DASEN, MD   4 months ago Degeneration of intervertebral disc of lumbar region with discogenic back pain and lower extremity pain   [place] [facility] Duanne, Butler DASEN, MD   5 months ago Dysuria   [place] [facility] Aletha Bene, MD   8 months ago Atrophic vaginitis   [place] [facility] [name] DASEN, MD   1 year ago Osteoporosis without current pathological fracture, unspecified osteoporosis type   [place] [facility] Pickard, Butler DASEN, MD              Signed Prescriptions Disp Refills   simvastatin  (ZOCOR ) 20 MG tablet 90 tablet 3    Sig: TAKE 1 TABLET BY MOUTH ONCE DAILY AT BEDTIME     Cardiovascular:  Antilipid - Statins Failed - 03/24/2024  5:25 PM      Failed - Lipid Panel in normal range within the last 12 months    Cholesterol  Date Value Ref Range Status  02/24/2024 261 (H) <200 mg/dL Final   LDL Cholesterol (Calc)  Date Value Ref Range Status  02/24/2024 117 (H) mg/dL  (calc) Final    Comment:    Reference range: <100 . Desirable range <100 mg/dL for primary prevention;   <70 mg/dL for patients with CHD or diabetic patients  with > or = 2 CHD risk factors. SABRA LDL-C is now calculated using the Martin-Hopkins  calculation, which is a validated novel method providing  better accuracy than the Friedewald equation in the  estimation of LDL-C.  Gladis APPLETHWAITE et al. SANDREA. 7986;689(80): 2061-2068  (http://education.[facility].com/faq/FAQ164)    HDL  Date Value Ref Range Status  02/24/2024 129 > OR = 50 mg/dL Final   Triglycerides  Date Value Ref Range Status  02/24/2024 61 <150 mg/dL Final         Passed - Patient is not pregnant      Passed - Valid encounter within last 12 months    Recent Outpatient Visits           4 weeks ago Routine general medical examination at a health care facility   [facility] [name] DASEN, MD   4 months ago Degeneration of intervertebral disc of lumbar region with discogenic back pain and lower extremity pain   [place] [facility] [name] DASEN, MD   5 months ago Dysuria   [place] [facility] Aletha Bene, MD   8 months ago Atrophic vaginitis   [place] [facility] Duanne, Butler DASEN, MD   1 year ago Osteoporosis without current pathological fracture, unspecified osteoporosis type   [place] [facility] Pickard, Butler DASEN, MD

## 2024-03-29 DIAGNOSIS — M461 Sacroiliitis, not elsewhere classified: Secondary | ICD-10-CM | POA: Diagnosis not present

## 2024-03-30 DIAGNOSIS — M542 Cervicalgia: Secondary | ICD-10-CM | POA: Diagnosis not present

## 2024-03-30 DIAGNOSIS — M791 Myalgia, unspecified site: Secondary | ICD-10-CM | POA: Diagnosis not present

## 2024-03-30 DIAGNOSIS — M48062 Spinal stenosis, lumbar region with neurogenic claudication: Secondary | ICD-10-CM | POA: Diagnosis not present

## 2024-03-30 DIAGNOSIS — G894 Chronic pain syndrome: Secondary | ICD-10-CM | POA: Diagnosis not present

## 2024-03-30 DIAGNOSIS — M4322 Fusion of spine, cervical region: Secondary | ICD-10-CM | POA: Diagnosis not present

## 2024-03-30 NOTE — Progress Notes (Signed)
 Subjective:    Katelyn [name] is a 78 y.o. (DOB 1945-07-02) female.     Patient presents with  . Neck - Pain  . Follow-up     HPI:  Patient has a history of right SI joint fusion on 03/30/2023 with Dr. Gust.  Also history of a L2-4 PSF and L3-4 OLIF by Dr. Gust.  She also has a history of a C3-5 ACDF back in 2015 and a C4-T3 fusion from October 2016.  Patient did well with those surgeries as well as outcomes.  Overall pain is worsening throughout her lower back into her right buttock and into her R hip. She gets radiation into her groin. She is in the process of obtaining insurance approval for SPRINT PNS.  Additionally presents today with complaints of neck pain and bilateral trap pain. She asks for a TPI today.  Imaging: X-rays 07/07/2023 showing previous L3-4 OLIF and L2-4 PSF and TLIF at L2-3 with hardware intact without subsidence.  There was a right SI joint also noted fused without complications.  Degenerative scoliosis was noted above the fusion.  Moderate joint line narrowing in both hip joints.  Lumbar MRI 09/16/2022 showed revision of lumbar fusion extending from L2-L4 intact.  Mild increased narrowing of the right lateral recess and right foramen at L1-2.  Previous surgery at L3-4 with mild residual spinal stenosis and right foraminal and lateral recess narrowing, stable chronic central disc protrusion at L5-S1 without significant spinal stenosis.  DEXA bone density scan with significant osteoporosis noted in both hips and wrist as well.  Her primary care is aware of this finding and she has already been contacted to consider osteoporosis management with twice yearly injections.  She is encouraged to keep this follow-up with her primary care and to follow through on the treatment.  Reviewed and updated this visit by provider: Tobacco  Allergies  Meds  Problems  Med Hx  Surg Hx  Fam Hx       Review of Systems  Constitutional:  Negative for fever.  Respiratory:   Negative for shortness of breath.   Cardiovascular:  Negative for chest pain.  Genitourinary:  Negative for difficulty urinating.  Neurological:  Negative for syncope.      Objective:   Vitals:   03/30/24 0958  BP: (!) 140/94  Pulse: 88  PainSc:   5   Physical Exam On examination today, patient is alert and pleasant.  She ambulates into the office today with slightly forward flexed position but bears full weight and does not require support device.  Close examination of the lumbar spine reveals significantly decreased flexion and extension and some pain with palpation along the lumbar sacral paraspinal musculature and referred into the bilateral buttocks.    Her cervical spine again also has significantly decreased range of motion surgical scarring from previous fusions.  She does have tight painful muscular banding in the lower cervical paraspinals and trapezius bilaterally.    Injections: R L1 SNRB 10/12/2023 without significant relief of her back pain. R L4 SNRB 01/03/24 with Dr. Darlean with 1 day relief  TPI into bilateral cervical paraspinal musculature 01/2024 with relief     Assessment / Plan:   Assessment 1. Cervicalgia (Primary) 2. Chronic pain syndrome 3. Myalgia -     lidocaine  (XYLOCAINE ) 1% injection 4 mL; 4 mL, Other, Once, On Thu 03/30/24 at 1045, For 1 dose -     triamcinolone  acetonide (KENalog -40) 40 mg/mL injection 40 mg; 40 mg, Intramuscular, Once,  On Thu 03/30/24 at 1045, For 1 dose 4. Cervical vertebral fusion 5. Lumbar stenosis with neurogenic claudication     Plan At this point we discussed things at length. Discussed with the patient consideration for a Sprint PNS system for chronic postsurgical lumbar pain.  She has previously discussed the Sprint PNS procedure with Dr. Darlean.  She is going to proceed with that. Insurance approval pending.  No further surgery at this time. Continue Duloxetine . In regards to her cervical pain, discussed TPI today. Reminded  patient that she should allow 3 months between injections. Patient understood that but she is desperate for relief. She understands the risks of repeated steroid injections.   Procedure: Patient with a significant flareup of muscle pain today.  Trigger point injection was completed for acute pain relief to the following muscle groups,Bilateral trapezius, erector spinae, and erector spinalis. Risk and benefits of procedure were discussed and questions were answered.  Informed consent was obtained.  Timeout was performed.  The skin overlying the injection site was cleansed with Betadine and alcohol.  Site was anesthetized with ethyl chloride spray.  Injection was completed with a 25-gauge 1.5 inch needle.  After negative aspiration, injection was completed using 40 mg of triamcinolone  and 4 mL of 1% lidocaine .  Needle withdrawn intact from injection site.  Homeostasis was achieved.  No immediate complications were noted.  Patient tolerated the procedure well.  Risks, benefits, and alternatives of the medications and treatment plan prescribed today were discussed, and patient expressed understanding. Plan follow-up as discussed or as needed if any worsening symptoms or change in condition.       Elveria Hoit, PA  Documentation for time-based billing:  Total time spent of date of service was 30 minutes.  Patient care activities included preparing to see the patient such as reviewing the patient record, performing a medically appropriate history and physical examination, ordering prescription medications, tests, or procedures, and documenting clinical information in the electronic or other health record.

## 2024-04-03 ENCOUNTER — Inpatient Hospital Stay (HOSPITAL_COMMUNITY)

## 2024-04-03 ENCOUNTER — Telehealth: Payer: Self-pay

## 2024-04-03 ENCOUNTER — Encounter (HOSPITAL_COMMUNITY): Payer: Self-pay

## 2024-04-03 ENCOUNTER — Ambulatory Visit: Payer: Self-pay | Admitting: Family Medicine

## 2024-04-03 ENCOUNTER — Emergency Department (HOSPITAL_COMMUNITY)

## 2024-04-03 ENCOUNTER — Inpatient Hospital Stay (HOSPITAL_COMMUNITY)
Admission: EM | Admit: 2024-04-03 | Discharge: 2024-04-04 | DRG: 201 | Disposition: A | Discharge status: Home / Self Care | Source: Ambulatory Visit | Attending: Family Medicine | Admitting: Family Medicine

## 2024-04-03 ENCOUNTER — Other Ambulatory Visit: Payer: Self-pay

## 2024-04-03 ENCOUNTER — Encounter: Payer: Self-pay | Admitting: Family Medicine

## 2024-04-03 ENCOUNTER — Ambulatory Visit: Admitting: Family Medicine

## 2024-04-03 ENCOUNTER — Ambulatory Visit
Admission: RE | Admit: 2024-04-03 | Discharge: 2024-04-03 | Disposition: A | Source: Ambulatory Visit | Attending: Family Medicine | Admitting: Family Medicine

## 2024-04-03 VITALS — BP 122/82 | HR 102 | Temp 98.2°F | Ht 62.0 in | Wt 104.1 lb

## 2024-04-03 DIAGNOSIS — R0602 Shortness of breath: Secondary | ICD-10-CM | POA: Diagnosis not present

## 2024-04-03 DIAGNOSIS — I700 Atherosclerosis of aorta: Secondary | ICD-10-CM | POA: Diagnosis not present

## 2024-04-03 DIAGNOSIS — J9811 Atelectasis: Secondary | ICD-10-CM | POA: Diagnosis not present

## 2024-04-03 DIAGNOSIS — J939 Pneumothorax, unspecified: Secondary | ICD-10-CM | POA: Diagnosis not present

## 2024-04-03 DIAGNOSIS — Z8249 Family history of ischemic heart disease and other diseases of the circulatory system: Secondary | ICD-10-CM

## 2024-04-03 DIAGNOSIS — I10 Essential (primary) hypertension: Secondary | ICD-10-CM | POA: Diagnosis present

## 2024-04-03 DIAGNOSIS — Z882 Allergy status to sulfonamides status: Secondary | ICD-10-CM

## 2024-04-03 DIAGNOSIS — Z8709 Personal history of other diseases of the respiratory system: Secondary | ICD-10-CM

## 2024-04-03 DIAGNOSIS — Z7983 Long term (current) use of bisphosphonates: Secondary | ICD-10-CM

## 2024-04-03 DIAGNOSIS — Z4682 Encounter for fitting and adjustment of non-vascular catheter: Secondary | ICD-10-CM | POA: Diagnosis not present

## 2024-04-03 DIAGNOSIS — R000 Tachycardia, unspecified: Secondary | ICD-10-CM | POA: Diagnosis not present

## 2024-04-03 DIAGNOSIS — Z87891 Personal history of nicotine dependence: Secondary | ICD-10-CM | POA: Diagnosis not present

## 2024-04-03 DIAGNOSIS — Z7982 Long term (current) use of aspirin: Secondary | ICD-10-CM | POA: Diagnosis not present

## 2024-04-03 DIAGNOSIS — T797XXA Traumatic subcutaneous emphysema, initial encounter: Secondary | ICD-10-CM | POA: Diagnosis not present

## 2024-04-03 DIAGNOSIS — J439 Emphysema, unspecified: Secondary | ICD-10-CM | POA: Diagnosis not present

## 2024-04-03 DIAGNOSIS — R918 Other nonspecific abnormal finding of lung field: Secondary | ICD-10-CM | POA: Diagnosis not present

## 2024-04-03 DIAGNOSIS — J95811 Postprocedural pneumothorax: Secondary | ICD-10-CM | POA: Diagnosis not present

## 2024-04-03 DIAGNOSIS — E785 Hyperlipidemia, unspecified: Secondary | ICD-10-CM | POA: Diagnosis present

## 2024-04-03 DIAGNOSIS — Z981 Arthrodesis status: Secondary | ICD-10-CM | POA: Diagnosis not present

## 2024-04-03 LAB — COMPREHENSIVE METABOLIC PANEL WITH GFR
ALT: 36 U/L (ref 0–44)
AST: 28 U/L (ref 15–41)
Albumin: 4 g/dL (ref 3.5–5.0)
Alkaline Phosphatase: 63 U/L (ref 38–126)
Anion gap: 11 (ref 5–15)
BUN: 6 mg/dL — ABNORMAL LOW (ref 8–23)
CO2: 25 mmol/L (ref 22–32)
Calcium: 9.6 mg/dL (ref 8.9–10.3)
Chloride: 93 mmol/L — ABNORMAL LOW (ref 98–111)
Creatinine, Ser: 0.64 mg/dL (ref 0.44–1.00)
GFR, Estimated: >60 mL/min (ref >60)
Glucose, Bld: 126 mg/dL — ABNORMAL HIGH (ref 70–99)
Potassium: 3.8 mmol/L (ref 3.5–5.1)
Sodium: 129 mmol/L — ABNORMAL LOW (ref 135–145)
Total Bilirubin: 0.7 mg/dL (ref 0.0–1.2)
Total Protein: 7 g/dL (ref 6.5–8.1)

## 2024-04-03 LAB — CBC WITH DIFFERENTIAL/PLATELET
Abs Immature Granulocytes: 0.04 K/uL (ref 0.00–0.07)
Basophils Absolute: 0.1 K/uL (ref 0.0–0.1)
Basophils Relative: 1 %
Eosinophils Absolute: 0 K/uL (ref 0.0–0.5)
Eosinophils Relative: 0 %
HCT: 44.3 % (ref 36.0–46.0)
Hemoglobin: 15 g/dL (ref 12.0–15.0)
Immature Granulocytes: 1 %
Lymphocytes Relative: 24 %
Lymphs Abs: 1.8 K/uL (ref 0.7–4.0)
MCH: 31.6 pg (ref 26.0–34.0)
MCHC: 33.9 g/dL (ref 30.0–36.0)
MCV: 93.5 fL (ref 80.0–100.0)
Monocytes Absolute: 0.6 K/uL (ref 0.1–1.0)
Monocytes Relative: 8 %
Neutro Abs: 5.2 K/uL (ref 1.7–7.7)
Neutrophils Relative %: 66 %
Platelets: 403 K/uL — ABNORMAL HIGH (ref 150–400)
RBC: 4.74 MIL/uL (ref 3.87–5.11)
RDW: 13.2 % (ref 11.5–15.5)
WBC: 7.8 K/uL (ref 4.0–10.5)
nRBC: 0 % (ref 0.0–0.2)

## 2024-04-03 MED ORDER — ALPRAZOLAM 0.5 MG PO TABS
0.5000 mg | ORAL_TABLET | Freq: Three times a day (TID) | ORAL | Status: DC | PRN
Start: 2024-04-03 — End: 2024-04-04
  Administered 2024-04-03 – 2024-04-04 (×2): 0.5 mg via ORAL
  Filled 2024-04-03 (×2): qty 1

## 2024-04-03 MED ORDER — HYDROCHLOROTHIAZIDE 25 MG PO TABS
25.0000 mg | ORAL_TABLET | Freq: Every day | ORAL | Status: DC
  Administered 2024-04-03 – 2024-04-04 (×2): 25 mg via ORAL
  Filled 2024-04-03 (×2): qty 1

## 2024-04-03 MED ORDER — HYDROMORPHONE HCL 1 MG/ML IJ SOLN
0.5000 mg | INTRAMUSCULAR | Status: DC | PRN
  Administered 2024-04-03 – 2024-04-04 (×3): 0.5 mg via INTRAVENOUS
  Filled 2024-04-03 (×3): qty 0.5

## 2024-04-03 MED ORDER — ACETAMINOPHEN 500 MG PO TABS
1000.0000 mg | ORAL_TABLET | Freq: Once | ORAL | Status: AC
  Administered 2024-04-03: 1000 mg via ORAL
  Filled 2024-04-03: qty 2

## 2024-04-03 MED ORDER — GABAPENTIN 300 MG PO CAPS
300.0000 mg | ORAL_CAPSULE | Freq: Three times a day (TID) | ORAL | Status: DC
  Administered 2024-04-03 – 2024-04-04 (×3): 300 mg via ORAL
  Filled 2024-04-03 (×3): qty 1

## 2024-04-03 MED ORDER — ACETAMINOPHEN 500 MG PO TABS
1000.0000 mg | ORAL_TABLET | Freq: Three times a day (TID) | ORAL | Status: DC
  Administered 2024-04-03 – 2024-04-04 (×3): 1000 mg via ORAL
  Filled 2024-04-03 (×3): qty 2

## 2024-04-03 MED ORDER — LIDOCAINE HCL (PF) 1 % IJ SOLN
10.0000 mL | Freq: Once | INTRAMUSCULAR | Status: AC
  Administered 2024-04-03: 10 mL via INTRADERMAL
  Filled 2024-04-03: qty 10

## 2024-04-03 MED ORDER — TRAMADOL HCL 50 MG PO TABS
100.0000 mg | ORAL_TABLET | Freq: Four times a day (QID) | ORAL | Status: DC | PRN
  Administered 2024-04-03: 100 mg via ORAL
  Filled 2024-04-03: qty 2

## 2024-04-03 MED ORDER — TRAMADOL HCL 50 MG PO TABS
50.0000 mg | ORAL_TABLET | Freq: Four times a day (QID) | ORAL | Status: DC | PRN
Start: 2024-04-03 — End: 2024-04-03

## 2024-04-03 MED ORDER — DULOXETINE HCL 30 MG PO CPEP
30.0000 mg | ORAL_CAPSULE | Freq: Two times a day (BID) | ORAL | Status: DC
  Administered 2024-04-03 – 2024-04-04 (×3): 30 mg via ORAL
  Filled 2024-04-03 (×3): qty 1

## 2024-04-03 MED ORDER — HYDROMORPHONE HCL 1 MG/ML IJ SOLN
0.5000 mg | Freq: Once | INTRAMUSCULAR | Status: AC
  Administered 2024-04-03: 0.5 mg via INTRAVENOUS
  Filled 2024-04-03: qty 1

## 2024-04-03 MED ORDER — FENTANYL CITRATE (PF) 50 MCG/ML IJ SOSY
50.0000 ug | PREFILLED_SYRINGE | Freq: Once | INTRAMUSCULAR | Status: AC
  Administered 2024-04-03: 50 ug via INTRAVENOUS
  Filled 2024-04-03: qty 1

## 2024-04-03 NOTE — Consult Note (Signed)
   NAME:  Katelyn [name], MRN:  [id number], DOB:  08-Dec-1945, LOS: 0 ADMISSION DATE:  04/03/2024, CONSULTATION DATE: 04/03/2024 REFERRING MD: Marsha Ada MD, CHIEF COMPLAINT: Recurrent pneumothorax  History of Present Illness:   The patient presents with recurrent pneumothorax following trigger point injections. She is accompanied by a family member.  Recurrent pneumothorax - Recurrent pneumothorax following trigger point injections, most recently on the right side after injection in the shoulder and neck area - Immediate onset of symptoms after the recent injection, prompting emergency room evaluation and confirmation of pneumothorax - Previous pneumothorax occurred on the left side in 2023, also following trigger point injections  Chest tube insertion site pain - Significant pain at the site of chest tube insertion - Pain is more severe compared to the previous pneumothorax episode  Musculoskeletal history - History of seven back surgeries - Presence of extensive spinal hardware  Tobacco use history - Ceased smoking approximately thirty years ago - Prior history of smoking about one pack of cigarettes per week - No use of vaping or marijuana  Pertinent  Medical History    has a past medical history of Anxiety, Arthritis, Heart murmur, Hemorrhoids, Hyperlipidemia, Hypertension, Migraines, and Osteoporosis.   Significant Hospital Events: Including procedures, antibiotic start and stop dates in addition to other pertinent events     Interim History / Subjective:    Objective    Blood pressure (!) 144/112, pulse 87, temperature 98.1 F (36.7 C), temperature source Oral, resp. rate 18, height 5' 2 (1.575 m), weight 47.2 kg, SpO2 100%.       No intake or output data in the 24 hours ending 04/03/24 1653 Filed Weights   04/03/24 1254  Weight: 47.2 kg    Examination: Gen:      Thin female in moderate pain due chest tube insertion site HEENT:  EOMI, sclera  anicteric Neck:     No masses; no thyromegaly Lungs:    Clear to auscultation bilaterally; normal respiratory effort CV:         Regular rate and rhythm; no murmurs Abd:      + bowel sounds; soft, non-tender; no palpable masses, no distension Ext:    No edema; adequate peripheral perfusion Neuro: alert and oriented x 3 Psych: normal mood and affect   CT chest 04/27/2022-mild atelectasis in the lung base.  No emphysema, bullae, cysts. Chest x-ray 04/03/2024-large right-sided pneumothorax Chest x-ray 05/04/2024-reexpansion of right lung.  Tiny apical pneumothorax persists.  Resolved problem list   Assessment and Plan  Right-sided pneumothorax secondary to trigger point injection Recurrent right-sided pneumothorax following trigger point injection in the scapular region. Previous pneumothorax on the left side in 2023, also post-trigger point injection. No smoking-related lung damage or bullous lesions on CT. Current pneumothorax associated with significant pain, unlike the previous episode. - There is no air leak noticed on coughing or straining.  Transition chest tube to water seal. - Order chest x-ray in a couple of hours and tomorrow AM to assess lung expansion. - Remove chest tube if lung remains fully expanded. - Administer analgesics as needed.  Patient has Dilaudid  as needed  Signature:   Jakaylee Sasaki MD [facility] See Amion for pager  If no response to pager , please call [phone] until 7pm After 7:00 pm call Elink  [phone] 04/03/2024, 4:59 PM

## 2024-04-03 NOTE — H&P (Signed)
 History and Physical    Patient: Katelyn [name] FMW:[id number] DOB: 09-24-1945 DOA: 04/03/2024 DOS: the patient was seen and examined on 04/03/2024 PCP: [name] DASEN, MD  Patient coming from: Home  Chief Complaint:  Chief Complaint  Patient presents with   Collapsed R Lung   HPI: Katelyn [name] is a 78 y.o. female with medical history significant of HTN, HLD, prior PTX  and migraines p/w R cp and found to have large R PTX.  The patient reported having a history of neck surgery twelve years ago, which resulted in ongoing pain. To manage this, the patient had been receiving trigger point injections. However, during a recent injection on the right side, the patient's lung was clipped, and they experienced immediate pain and SOB. The patient visited her primary care doctor this morning, who ordered scans and instructed them to go to the emergency department when it showed a collapsed lung.  In the ED, pt hypertensive, tachycardic and tachypneic on RA. Labs notable for Na 129. CXR with large R PTX. EDP inserted chest tube, consulted pulmonology and requested medicine admission.   Review of Systems: As mentioned in the history of present illness. All other systems reviewed and are negative. Past Medical History:  Diagnosis Date   Anxiety    Arthritis    cervical and lumbar DDD   Heart murmur    Hemorrhoids    Hyperlipidemia    Hypertension    Migraines    Due to neck pain   Osteoporosis    Past Surgical History:  Procedure Laterality Date   BUNIONECTOMY     right   CERVICAL FUSION     x3   COLONOSCOPY     FLEXIBLE SIGMOIDOSCOPY N/A 01/14/2023   Procedure: FLEXIBLE SIGMOIDOSCOPY;  Surgeon: Aneita Gwendlyn DASEN, MD;  Location: [facility];  Service: Gastroenterology;  Laterality: N/A;   HEMORRHOID BANDING     HEMOSTASIS CLIP PLACEMENT  01/14/2023   Procedure: HEMOSTASIS CLIP PLACEMENT;  Surgeon: Aneita Gwendlyn DASEN, MD;  Location: [facility];  Service: Gastroenterology;;    LUMBAR DISC SURGERY     POLYPECTOMY     radiofrequency ablasion     SPINE SURGERY      Dr. Gust (x2, last Nov/15) C3-T4   UPPER GASTROINTESTINAL ENDOSCOPY     Social History:  reports that she has never smoked. She has never used smokeless tobacco. She reports current alcohol use of about 7.0 standard drinks of alcohol per week. She reports that she does not use drugs.  Allergies  Allergen Reactions   Iodine Hives   Prednisone Hives    Pt took Benadryl  with Prednisone for surgery and did ok with it   Sulfa Antibiotics Swelling   Red Dye #40 (Allura Red) Rash    Family History  Problem Relation Age of Onset   Colon polyps Mother    Arthritis Mother    Hearing loss Mother    Hyperlipidemia Mother    Hypertension Mother    Stroke Mother    Heart disease Mother    Colon polyps Father    Arthritis Father    Heart disease Father    Hyperlipidemia Father    Arthritis Maternal Grandmother    Arthritis Maternal Grandfather    Arthritis Paternal Grandmother    Arthritis Paternal Grandfather    Colon cancer Neg Hx    Esophageal cancer Neg Hx    Stomach cancer Neg Hx    Rectal cancer Neg Hx    Breast cancer Neg Hx  BRCA 1/2 Neg Hx     Prior to Admission medications   Medication Sig Start Date End Date Taking? Authorizing Provider  alendronate  (FOSAMAX ) 70 MG tablet Take 1 tablet (70 mg total) by mouth every 7 (seven) days. Take with a full glass of water on an empty stomach. 11/02/23   [name] DASEN, MD  ALPRAZolam  (XANAX ) 0.5 MG tablet TAKE 1 TABLET BY MOUTH THREE TIMES DAILY AS NEEDED FOR ANXIETY 03/27/24   [name] DASEN, MD  Ascorbic Acid (VITAMIN C) 500 MG tablet Take 500 mg by mouth daily.      [provider]  aspirin  81 MG tablet Take 81 mg by mouth daily.      [provider]  B Complex Vitamins (VITAMIN B COMPLEX PO) Take 1 tablet by mouth daily.      [provider]  calcium carbonate (OS-CAL) 600 MG TABS Take 600 mg by mouth 2 (two)  times daily with a meal.      [provider]  cephALEXin  (KEFLEX ) 500 MG capsule Take 1 capsule (500 mg total) by mouth 3 (three) times daily. Patient not taking: Reported on 04/03/2024 06/03/23   [name] DASEN, MD  diclofenac  sodium (VOLTAREN ) 1 % GEL Apply 2 g topically 4 (four) times daily.    [provider]  DULoxetine  (CYMBALTA ) 30 MG capsule Take 1 capsule (30 mg total) by mouth 2 (two) times daily. 09/08/17   [name] DASEN, MD  fish oil-omega-3 fatty acids 1000 MG capsule Take 1 g by mouth 2 (two) times daily.      [provider]  Garlic 1000 MG CAPS Take 1 capsule by mouth daily.      [provider]  hydrochlorothiazide  (HYDRODIURIL ) 25 MG tablet Take 1 tablet by mouth once daily 03/07/24   [name] DASEN, MD  HYDROcodone-acetaminophen  (NORCO/VICODIN) 5-325 MG tablet  01/29/21   [provider]  hydrocortisone  (ANUSOL -HC) 2.5 % rectal cream Place 1 Application rectally at bedtime. 06/02/23   Kerman Vina HERO, NP  Lysine 500 MG CAPS Take 1 capsule by mouth daily.      [provider]  magnesium oxide (MAG-OX) 400 MG tablet Take 400 mg by mouth daily.      [provider]  Multiple Vitamins-Minerals (MULTIVITAMIN WITH MINERALS) tablet Take 1 tablet by mouth daily.      [provider]  nortriptyline  (PAMELOR ) 10 MG capsule Take 10 mg by mouth See admin instructions. Take two capsules by mouth (20 mg) every morning and three capsules by mouth (30 mg) every evening.    [provider]  nystatin -triamcinolone  ointment (MYCOLOG) Apply 1 Application topically 2 (two) times daily. Apply to affected area BID Patient not taking: Reported on 04/03/2024 09/27/23   Aletha Bene, MD  POTASSIUM GLUCONATE PO Take 1 tablet by mouth daily.      [provider]  PRESCRIPTION MEDICATION Trigger point injections.  Once every 3 months.    [provider]  simvastatin  (ZOCOR ) 20 MG tablet TAKE 1 TABLET  BY MOUTH ONCE DAILY AT BEDTIME 03/24/24   [name] DASEN, MD  SUMAtriptan  (IMITREX ) 100 MG tablet Take 1 tablet (100 mg total) by mouth every 2 (two) hours as needed for migraine. May repeat in 2 hours if headache persists or recurs. 02/10/24   [name] DASEN, MD  traMADol  (ULTRAM ) 50 MG tablet Take 50 mg by mouth every 8 (eight) hours as needed. for pain 10/22/17   [provider]  vitamin E 400 UNIT capsule Take 400 Units by mouth daily.      [provider]    Physical Exam: Vitals:   04/03/24 1310 04/03/24 1445 04/03/24 1500 04/03/24 1530  BP: (!) 147/102 (!) 138/90 (!) 145/100 (!) 142/94  Pulse: 98 91 91 90  Resp: 17 13 16 18   Temp:      SpO2: 96% 99% 95% 98%  Weight:      Height:       General: Alert, oriented x3, resting comfortably in no acute distress Respiratory: Decreased air exchange on R; no wheezing Cardiovascular: Regular rate and rhythm w/o m/r/g    Data Reviewed:  Lab Results  Component Value Date   WBC 7.8 04/03/2024   HGB 15.0 04/03/2024   HCT 44.3 04/03/2024   MCV 93.5 04/03/2024   PLT 403 (H) 04/03/2024   Lab Results  Component Value Date   GLUCOSE 126 (H) 04/03/2024   CALCIUM 9.6 04/03/2024   NA 129 (L) 04/03/2024   K 3.8 04/03/2024   CO2 25 04/03/2024   CL 93 (L) 04/03/2024   BUN 6 (L) 04/03/2024   CREATININE 0.64 04/03/2024   Lab Results  Component Value Date   ALT 36 04/03/2024   AST 28 04/03/2024   ALKPHOS 63 04/03/2024   BILITOT 0.7 04/03/2024   Lab Results  Component Value Date   INR 1.0 01/14/2023   Radiology: DG Chest 2 View Addendum Date: 04/03/2024 ** ADDENDUM #1 ** ADDENDUM: Connie Emperor MD was aware of these results at 1152 hours on 04/03/2024. Patient reportedly being sent to Emergency Department. ---------------------------------------------------- Electronically signed by: Helayne Hurst MD 04/03/2024 12:15 PM EDT RP Workstation: [id number]   Result Date: 04/03/2024 ** ORIGINAL REPORT ** EXAM: 2  VIEW(S) XRAY OF THE CHEST 04/03/2024 11:15:58 AM COMPARISON: Chest CT 04/27/2022 and earlier. CLINICAL HISTORY: 78 year old female. Shortness of breath, history of pneumothorax. Cough, radiating posterior neck pain, winded going up stairs, headaches after trigger point injection. FINDINGS: LUNGS AND PLEURA: Large right sided pneumothorax. Underlying chronic pulmonary hyperinflation suspected. Left lung appears negative. No pulmonary edema. No pleural effusion. HEART AND MEDIASTINUM: No leftward mediastinal shift when compared to 2023 radiographs. No acute abnormality of the cardiac and mediastinal silhouettes. BONES AND SOFT TISSUES: Chronic scoliosis and multilevel spinal fusion. No acute osseous abnormality. IMPRESSION: 1. Positive for Large right-sided pneumothorax. No significant mediastinal shift. Underlying chronic pulmonary hyperinflation. Electronically signed by: Helayne Hurst MD 04/03/2024 11:39 AM EDT RP Workstation: [id number]    Assessment and Plan: 39F h/o HTN, HLD, prior PTX  and migraines p/w R cp and found to have large R PTX.  R PTX -Pulmonology consulted; apprec eval/recs -Agree with chest tube placement -Multimodal pain control w/ tylenol , gabapetin, and tramadol  prn -IV dilaudid  0.5mg  q3h prn -F/u serial AM CXRs  HTN -Hydrochlorothiazide  25mg  daily   Advance Care Planning:   Code Status: Full Code   Consults: Pulm  Family Communication: Spouse  Severity of Illness: The appropriate patient status for this patient is INPATIENT. Inpatient status is judged to be reasonable and necessary in order to provide the required intensity of service to ensure the patient's safety. The patient's presenting symptoms, physical exam findings, and initial radiographic and laboratory data in the context of their chronic comorbidities is felt to place them at high risk for further clinical deterioration. Furthermore, it is not anticipated that the patient will be medically stable for discharge  from the hospital within 2 midnights of admission.   * I  certify that at the point of admission it is my clinical judgment that the patient will require inpatient hospital care spanning beyond 2 midnights from the point of admission due to high intensity of service, high risk for further deterioration and high frequency of surveillance required.*   ------- I spent 60 minutes reviewing previous notes, at the bedside counseling/discussing the treatment plan, and performing clinical documentation.  Author: Marsha Ada, MD 04/03/2024 3:38 PM  For on call review [email or web address].

## 2024-04-03 NOTE — Progress Notes (Addendum)
 Patient Office Visit  Assessment & Plan:  Shortness of breath -     DG Chest 2 View; Future  History of pneumothorax -     DG Chest 2 View; Future   Assessment and Plan    Shortness of breath and right-sided chest pain, possible pneumothorax Acute shortness of breath and right-sided chest pain, likely pneumothorax or pleurisy. Elevated pulse suggests respiratory distress. Right-sided symptoms indicate new issue. - Order stat chest x-ray at [facility] in [place]. - Contact her immediately with results. - Refer to ER if x-ray confirms pneumothorax.      We will notify patient of the results.  If positive for pneumothorax she will need to go to the ER.  Patient is agreeable to plan. Addendum- patient will be notified re this now and needs to go to ED now.  IMPRESSION: 1. Positive for Large right-sided pneumothorax. No significant mediastinal shift. Underlying chronic pulmonary hyperinflation.   Electronically signed by: Helayne Hurst MD 04/03/2024 11:39 AM EDT RP Return if symptoms worsen or fail to improve.   Subjective:    Patient ID: Katelyn [name], female    DOB: 1946/02/14  Age: 78 y.o. MRN: [id number]  Chief Complaint  Patient presents with   Shortness of Breath    Pt states that she had a trigger point injection last Thursday and now feels SOB, a cough and a headache. Pt states the last time this happened, she had a collapsed lung.     Shortness of Breath   Discussed the use of AI scribe software for clinical note transcription with the patient, who gave verbal consent to proceed.  History of Present Illness        History of Present Illness Katelyn [name] is a 78 year old female with a history of left sided pneumothorax who presents with shortness of breath since Thursday. patient is concerned because symptoms are worsening.   She has been experiencing shortness of breath since Thursday, following spinal steroid injections for scoliosis in [facility]. The symptoms are reminiscent of those she experienced two years ago when she was diagnosed with a  left sided pneumothorax after initially evaluation thought to be pleurisy. CXR showed large left sided pneumothorax in 2023. At that time, she was hospitalized for three days and required a chest tube.  Currently, the shortness of breath is worsening, particularly affecting her sleep. The discomfort is more pronounced on the right side, whereas her previous pneumothorax was on the left side. Despite the symptoms, she delayed seeking emergency care due to family commitments, including her husband's birthday and a visit from her children and grandchildren.  No fever, chills, or cough. She feels cold, which she attributes to the weather, and confirms no history of blood clots or recent viral illnesses. She has not been exposed to COVID-19 and has received her flu shot. She denies any history of smoking.  She has been living in [place]  for about sixteen years, having moved from [place]  to be closer to her children.  Physical Exam VITALS: SaO2- 99% CHEST: Lungs clear to auscultation.  Assessment and Plan Shortness of breath and right-sided chest pain, possible pneumothorax Acute shortness of breath and right-sided chest pain, likely pneumothorax or pleurisy. Elevated pulse suggests respiratory distress. Right-sided symptoms indicate new issue. - Order stat chest x-ray at [facility] in [place]. - Contact her immediately with results. - Refer to ER if x-ray confirms pneumothorax.    The ASCVD Risk score (Arnett DK,  et al., 2019) failed to calculate for the following reasons:   The valid HDL cholesterol range is 20 to 100 mg/dL  Past Medical History:  Diagnosis Date   Anxiety    Arthritis    cervical and lumbar DDD   Heart murmur    Hemorrhoids    Hyperlipidemia    Hypertension    Migraines    Due to neck pain   Osteoporosis    Past Surgical History:   Procedure Laterality Date   BUNIONECTOMY     right   CERVICAL FUSION     x3   COLONOSCOPY     FLEXIBLE SIGMOIDOSCOPY N/A 01/14/2023   Procedure: FLEXIBLE SIGMOIDOSCOPY;  Surgeon: Aneita Gwendlyn DASEN, MD;  Location: [facility];  Service: Gastroenterology;  Laterality: N/A;   HEMORRHOID BANDING     HEMOSTASIS CLIP PLACEMENT  01/14/2023   Procedure: HEMOSTASIS CLIP PLACEMENT;  Surgeon: Aneita Gwendlyn DASEN, MD;  Location: [facility];  Service: Gastroenterology;;   LUMBAR DISC SURGERY     POLYPECTOMY     radiofrequency ablasion     SPINE SURGERY      Dr. Gust (x2, last Nov/15) C3-T4   UPPER GASTROINTESTINAL ENDOSCOPY     Social History   Tobacco Use   Smoking status: Never   Smokeless tobacco: Never  Vaping Use   Vaping status: Never Used  Substance Use Topics   Alcohol use: Yes    Alcohol/week: 7.0 standard drinks of alcohol    Types: 7 Glasses of wine per week    Comment: 1-2 rare   Drug use: No   Family History  Problem Relation Age of Onset   Colon polyps Mother    Arthritis Mother    Hearing loss Mother    Hyperlipidemia Mother    Hypertension Mother    Stroke Mother    Heart disease Mother    Colon polyps Father    Arthritis Father    Heart disease Father    Hyperlipidemia Father    Arthritis Maternal Grandmother    Arthritis Maternal Grandfather    Arthritis Paternal Grandmother    Arthritis Paternal Grandfather    Colon cancer Neg Hx    Esophageal cancer Neg Hx    Stomach cancer Neg Hx    Rectal cancer Neg Hx    Breast cancer Neg Hx    BRCA 1/2 Neg Hx    Allergies  Allergen Reactions   Iodine Hives   Prednisone Hives    Pt took Benadryl  with Prednisone for surgery and did ok with it   Sulfa Antibiotics Swelling   Red Dye #40 (Allura Red) Rash    Review of Systems  Respiratory:  Positive for shortness of breath.       Objective:    BP 122/82   Pulse (!) 102   Temp 98.2 F (36.8 C)   Ht 5' 2 (1.575 m)   Wt 104 lb 2 oz (47.2 kg)   SpO2  99%   BMI 19.04 kg/m  BP Readings from Last 3 Encounters:  04/03/24 122/82  02/24/24 110/62  12/15/23 122/78   Wt Readings from Last 3 Encounters:  04/03/24 104 lb 2 oz (47.2 kg)  02/24/24 103 lb 9.6 oz (47 kg)  12/15/23 107 lb (48.5 kg)    Physical Exam Vitals and nursing note reviewed.  Constitutional:      General: She is not in acute distress.    Appearance: Normal appearance.  HENT:     Head: Normocephalic.  Right Ear: There is impacted cerumen.     Left Ear: There is impacted cerumen.  Eyes:     Extraocular Movements: Extraocular movements intact.     Pupils: Pupils are equal, round, and reactive to light.  Cardiovascular:     Rate and Rhythm: Regular rhythm. Tachycardia present.     Heart sounds: Normal heart sounds.  Pulmonary:     Effort: Pulmonary effort is normal.     Breath sounds: Normal breath sounds. No wheezing or rhonchi.  Chest:     Chest wall: No tenderness.  Musculoskeletal:     Right lower leg: No edema.     Left lower leg: No edema.  Neurological:     General: No focal deficit present.     Mental Status: She is alert and oriented to person, place, and time.  Psychiatric:        Mood and Affect: Mood normal.        Behavior: Behavior normal.      No results found for any visits on 04/03/24.

## 2024-04-03 NOTE — Plan of Care (Signed)
 Chest tube site c/d/I , on water seal.  Regular diet.  Pain controlled with current pain regimen.   Problem: Education: Goal: Knowledge of General Education information will improve Description: Including pain rating scale, medication(s)/side effects and non-pharmacologic comfort measures Outcome: Progressing   Problem: Health Behavior/Discharge Planning: Goal: Ability to manage health-related needs will improve Outcome: Progressing   Problem: Clinical Measurements: Goal: Ability to maintain clinical measurements within normal limits will improve Outcome: Progressing Goal: Will remain free from infection Outcome: Progressing Goal: Diagnostic test results will improve Outcome: Progressing Goal: Respiratory complications will improve Outcome: Progressing Goal: Cardiovascular complication will be avoided Outcome: Progressing

## 2024-04-03 NOTE — ED Provider Notes (Signed)
 [place] EMERGENCY DEPARTMENT AT [facility] Provider Note  CSN: [id number] Arrival date & time: 04/03/24 1235  Chief Complaint(s) Collapsed R Lung  HPI Katelyn [name] is a 78 y.o. female history of hypertension, hyperlipidemia presenting to the emergency department with abnormal x-ray.  Patient reports late last week had trigger point injection.  Reports that after trigger point injection she had coughing and chest pain.  She reports some shortness of breath.  Worse on the right side.  Trigger point injection was on the right side.  She actually had a similar thing happen to her a couple of years ago and developed a pneumothorax on the left side.  No fainting.  No other injury or fall.  No other pain.   Past Medical History Past Medical History:  Diagnosis Date   Anxiety    Arthritis    cervical and lumbar DDD   Heart murmur    Hemorrhoids    Hyperlipidemia    Hypertension    Migraines    Due to neck pain   Osteoporosis    Patient Active Problem List   Diagnosis Date Noted   Pneumothorax on right 04/03/2024   Benign neoplasm of skin of lower limb 02/24/2024   Inverted nipple 02/24/2024   Lentigo 02/24/2024   Seborrheic keratoses 02/24/2024   Sun-damaged skin 02/24/2024   History of lumbar spinal fusion 10/28/2023   Myalgia 10/28/2023   Dysuria 09/27/2023   Itching in the vaginal area 09/27/2023   Abnormality of gait and mobility 03/31/2023   Hematochezia 01/14/2023   Prolapsed internal hemorrhoids, grade 2 12/31/2022   Pneumothorax on left 04/04/2022   Chest tube in place 04/04/2022   Spontaneous pneumothorax 04/03/2022   Sebaceous cyst 02/16/2022   Kyphosis 08/23/2019   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 Authorizing Provider  alendronate  (FOSAMAX ) 70 MG tablet Take 1 tablet (70 mg total) by mouth every 7 (seven) days. Take with a full glass of water on an empty stomach. 11/02/23   [name] DASEN, MD  ALPRAZolam  (XANAX ) 0.5 MG tablet TAKE 1 TABLET BY MOUTH THREE TIMES DAILY AS NEEDED FOR ANXIETY 03/27/24   [name] DASEN, MD  Ascorbic Acid (VITAMIN C) 500 MG tablet Take 500 mg by mouth daily.      [provider]  aspirin  81 MG tablet Take 81 mg by mouth daily.      [provider]  B Complex Vitamins (VITAMIN B COMPLEX PO) Take 1 tablet by mouth daily.      [provider]  calcium carbonate (OS-CAL) 600 MG TABS Take 600 mg by mouth 2 (two) times daily with a meal.      [provider]  cephALEXin  (KEFLEX ) 500 MG capsule Take 1 capsule (500 mg total) by mouth 3 (three) times daily. Patient not taking: Reported on 04/03/2024 06/03/23   [name] DASEN, MD  diclofenac  sodium (VOLTAREN ) 1 % GEL Apply 2 g topically 4 (four) times daily.    [provider]  DULoxetine  (CYMBALTA ) 30 MG capsule Take  1 capsule (30 mg total) by mouth 2 (two) times daily. 09/08/17   [name] DASEN, MD  fish oil-omega-3 fatty acids 1000 MG capsule Take 1 g by mouth 2 (two) times daily.      [provider]  Garlic 1000 MG CAPS Take 1 capsule by mouth daily.      [provider]  hydrochlorothiazide  (HYDRODIURIL ) 25 MG tablet Take 1 tablet by mouth once daily 03/07/24   [name] DASEN, MD  HYDROcodone-acetaminophen  (NORCO/VICODIN) 5-325 MG tablet  01/29/21   [provider]  hydrocortisone  (ANUSOL -HC) 2.5 % rectal cream Place 1 Application rectally at bedtime. 06/02/23   Kerman Vina HERO,  NP  Lysine 500 MG CAPS Take 1 capsule by mouth daily.      [provider]  magnesium oxide (MAG-OX) 400 MG tablet Take 400 mg by mouth daily.      [provider]  Multiple Vitamins-Minerals (MULTIVITAMIN WITH MINERALS) tablet Take 1 tablet by mouth daily.      [provider]  nortriptyline  (PAMELOR ) 10 MG capsule Take 10 mg by mouth See admin instructions. Take two capsules by mouth (20 mg) every morning and three capsules by mouth (30 mg) every evening.    [provider]  nystatin -triamcinolone  ointment (MYCOLOG) Apply 1 Application topically 2 (two) times daily. Apply to affected area BID Patient not taking: Reported on 04/03/2024 09/27/23   Aletha Bene, MD  POTASSIUM GLUCONATE PO Take 1 tablet by mouth daily.      [provider]  PRESCRIPTION MEDICATION Trigger point injections.  Once every 3 months.    [provider]  simvastatin  (ZOCOR ) 20 MG tablet TAKE 1 TABLET BY MOUTH ONCE DAILY AT BEDTIME 03/24/24   [name] DASEN, MD  SUMAtriptan  (IMITREX ) 100 MG tablet Take 1 tablet (100 mg total) by mouth every 2 (two) hours as needed for migraine. May repeat in 2 hours if headache persists or recurs. 02/10/24   [name] DASEN, MD  traMADol  (ULTRAM ) 50 MG tablet Take 50 mg by mouth every 8 (eight) hours as needed. for pain 10/22/17   [provider]  vitamin E 400 UNIT capsule Take 400 Units by mouth daily.      [provider]                                                                                                                                    Past Surgical History Past Surgical History:  Procedure Laterality Date   BUNIONECTOMY     right   CERVICAL FUSION     x3   COLONOSCOPY     FLEXIBLE SIGMOIDOSCOPY N/A 01/14/2023   Procedure: FLEXIBLE SIGMOIDOSCOPY;  Surgeon: Aneita Gwendlyn DASEN, MD;  Location: [facility];  Service: Gastroenterology;  Laterality: N/A;   HEMORRHOID BANDING     HEMOSTASIS CLIP  PLACEMENT  01/14/2023   Procedure: HEMOSTASIS CLIP PLACEMENT;  Surgeon: Aneita Gwendlyn DASEN,  MD;  Location: [facility];  Service: Gastroenterology;;   LUMBAR DISC SURGERY     POLYPECTOMY     radiofrequency ablasion     SPINE SURGERY      Dr. Gust (x2, last Nov/15) C3-T4   UPPER GASTROINTESTINAL ENDOSCOPY     Family History Family History  Problem Relation Age of Onset   Colon polyps Mother    Arthritis Mother    Hearing loss Mother    Hyperlipidemia Mother    Hypertension Mother    Stroke Mother    Heart disease Mother    Colon polyps Father    Arthritis Father    Heart disease Father    Hyperlipidemia Father    Arthritis Maternal Grandmother    Arthritis Maternal Grandfather    Arthritis Paternal Grandmother    Arthritis Paternal Grandfather    Colon cancer Neg Hx    Esophageal cancer Neg Hx    Stomach cancer Neg Hx    Rectal cancer Neg Hx    Breast cancer Neg Hx    BRCA 1/2 Neg Hx     Social History Social History   Tobacco Use   Smoking status: Never   Smokeless tobacco: Never  Vaping Use   Vaping status: Never Used  Substance Use Topics   Alcohol use: Yes    Alcohol/week: 7.0 standard drinks of alcohol    Types: 7 Glasses of wine per week    Comment: 1-2 rare   Drug use: No   Allergies Iodine, Prednisone, Sulfa antibiotics, and Red dye #40 (allura red)  Review of Systems Review of Systems  All other systems reviewed and are negative.   Physical Exam Vital Signs  I have reviewed the triage vital signs BP (!) 142/94   Pulse 90   Temp 98.2 F (36.8 C)   Resp 18   Ht 5' 2 (1.575 m)   Wt 47.2 kg   SpO2 98%   BMI 19.02 kg/m  Physical Exam Vitals and nursing note reviewed.  Constitutional:      General: She is not in acute distress.    Appearance: She is well-developed.  HENT:     Head: Normocephalic and atraumatic.     Mouth/Throat:     Mouth: Mucous membranes are moist.  Eyes:     Pupils: Pupils are equal, round, and reactive to light.   Cardiovascular:     Rate and Rhythm: Regular rhythm. Bradycardia present.     Heart sounds: No murmur heard. Pulmonary:     Effort: Pulmonary effort is normal. No respiratory distress.     Breath sounds: Examination of the right-upper field reveals decreased breath sounds. Examination of the right-middle field reveals decreased breath sounds. Examination of the right-lower field reveals decreased breath sounds. Decreased breath sounds present.  Abdominal:     General: Abdomen is flat.     Palpations: Abdomen is soft.     Tenderness: There is no abdominal tenderness.  Musculoskeletal:        General: No tenderness.     Right lower leg: No edema.     Left lower leg: No edema.  Skin:    General: Skin is warm and dry.  Neurological:     General: No focal deficit present.     Mental Status: She is alert. Mental status is at baseline.  Psychiatric:        Mood and Affect: Mood normal.        Behavior: Behavior normal.     ED  Results and Treatments Labs (all labs ordered are listed, but only abnormal results are displayed) Labs Reviewed  COMPREHENSIVE METABOLIC PANEL WITH GFR - Abnormal; Notable for the following components:      Result Value   Sodium 129 (*)    Chloride 93 (*)    Glucose, Bld 126 (*)    BUN 6 (*)    All other components within normal limits  CBC WITH DIFFERENTIAL/PLATELET - Abnormal; Notable for the following components:   Platelets 403 (*)    All other components within normal limits                                                                                                                          Radiology   Pertinent labs & imaging results that were available during my care of the patient were reviewed by me and considered in my medical decision making (see MDM for details).  Medications Ordered in ED Medications  DULoxetine  (CYMBALTA ) DR capsule 30 mg (has no administration in time range)  hydrochlorothiazide  (HYDRODIURIL ) tablet 25 mg (has no  administration in time range)  HYDROmorphone  (DILAUDID ) injection 0.5 mg (has no administration in time range)  fentaNYL  (SUBLIMAZE ) injection 50 mcg (50 mcg Intravenous Given 04/03/24 1410)  lidocaine  (PF) (XYLOCAINE ) 1 % injection 10 mL (10 mLs Intradermal Given 04/03/24 1401)  HYDROmorphone  (DILAUDID ) injection 0.5 mg (0.5 mg Intravenous Given 04/03/24 1446)  acetaminophen  (TYLENOL ) tablet 1,000 mg (1,000 mg Oral Given 04/03/24 1447)                                                                                                                                     Procedures .Critical Care  Performed by: Francesca Elsie CROME, MD Authorized by: Francesca Elsie CROME, MD   Critical care provider statement:    Critical care time (minutes):  30   Critical care was necessary to treat or prevent imminent or life-threatening deterioration of the following conditions:  Respiratory failure   Critical care was time spent personally by me on the following activities:  Development of treatment plan with patient or surrogate, discussions with consultants, evaluation of patient's response to treatment, examination of patient, ordering and review of laboratory studies, ordering and review of radiographic studies, ordering and performing treatments and interventions, pulse oximetry, re-evaluation of patient's condition and review of old charts   Care discussed with: admitting provider  CHEST TUBE INSERTION  Date/Time: 04/03/2024 3:35 PM  Performed by: Francesca Elsie CROME, MD Authorized by: Francesca Elsie CROME, MD   Consent:    Consent obtained:  Verbal and written   Consent given by:  Patient and spouse   Risks, benefits, and alternatives were discussed: yes     Risks discussed:  Bleeding, damage to surrounding structures, infection, incomplete drainage, nerve damage and pain   Alternatives discussed:  Delayed treatment, no treatment and alternative treatment Universal protocol:    Procedure explained  and questions answered to patient or proxy's satisfaction: yes     Relevant documents present and verified: yes     Test results available: yes     Imaging studies available: yes     Required blood products, implants, devices, and special equipment available: yes     Site/side marked: yes     Immediately prior to procedure, a time out was called: yes     Patient identity confirmed:  Verbally with patient and arm band Pre-procedure details:    Skin preparation:  Chlorhexidine with alcohol   Preparation: Patient was prepped and draped in the usual sterile fashion   Anesthesia:    Anesthesia method:  Local infiltration   Local anesthetic:  Lidocaine  1% w/o epi Procedure details:    Placement location:  R lateral   Scalpel size:  11   Tube size (French): 14.   Dissection instrument: seldinger technique.   Ultrasound guidance: yes     Tension pneumothorax: no     Tube connected to:  Water seal and suction   Drainage characteristics:  Air only   Suture material:  2-0 silk   Dressing:  4x4 sterile gauze and Xeroform gauze Post-procedure details:    Post-insertion x-ray findings: tube in good position     Procedure completion:  Tolerated well, no immediate complications   (including critical care time)  Medical Decision Making / ED Course   MDM:  78 year old presenting to the emergency department with abnormal x-ray.  Seems likely due to trigger point injection.  Reviewed outside imaging which shows pneumothorax.  I confirm pneumothorax is present on bedside ultrasound.  Patient consented for chest tube.  Chest tube was placed without difficulty.  Repeat x-ray confirms reexpansion of the lung.  Have discussed with pulmonology for consultation.  Heart rate improved after chest tube placement.  Patient will be admitted for further management      Additional history obtained: -Additional history obtained from spouse -External records from outside source obtained and reviewed  including: Chart review including previous notes, labs, imaging, consultation notes including outside XR and pmd note    Lab Tests: -I ordered, reviewed, and interpreted labs.   The pertinent results include:   Labs Reviewed  COMPREHENSIVE METABOLIC PANEL WITH GFR - Abnormal; Notable for the following components:      Result Value   Sodium 129 (*)    Chloride 93 (*)    Glucose, Bld 126 (*)    BUN 6 (*)    All other components within normal limits  CBC WITH DIFFERENTIAL/PLATELET - Abnormal; Notable for the following components:   Platelets 403 (*)    All other components within normal limits    Notable for mild hyponatremia   Imaging Studies ordered: I ordered imaging studies including CXR On my interpretation imaging demonstrates tube in good position I independently visualized and interpreted imaging. I agree with the radiologist interpretation   Medicines ordered and prescription drug management: Meds ordered this encounter  Medications   fentaNYL  (SUBLIMAZE ) injection 50 mcg   lidocaine  (PF) (XYLOCAINE ) 1 % injection 10 mL   HYDROmorphone  (DILAUDID ) injection 0.5 mg   acetaminophen  (TYLENOL ) tablet 1,000 mg   DULoxetine  (CYMBALTA ) DR capsule 30 mg   hydrochlorothiazide  (HYDRODIURIL ) tablet 25 mg   DISCONTD: traMADol  (ULTRAM ) tablet 50 mg    for pain     HYDROmorphone  (DILAUDID ) injection 0.5 mg    -I have reviewed the patients home medicines and have made adjustments as needed   Consultations Obtained: I requested consultation with the pulm,  and discussed lab and imaging findings as well as pertinent plan - they recommend: admit   Cardiac Monitoring: The patient was maintained on a cardiac monitor.  I personally viewed and interpreted the cardiac monitored which showed an underlying rhythm of: NSR  Reevaluation: After the interventions noted above, I reevaluated the patient and found that their symptoms have improved  Co morbidities that complicate the patient  evaluation  Past Medical History:  Diagnosis Date   Anxiety    Arthritis    cervical and lumbar DDD   Heart murmur    Hemorrhoids    Hyperlipidemia    Hypertension    Migraines    Due to neck pain   Osteoporosis       Dispostion: Disposition decision including need for hospitalization was considered, and patient admitted to the hospital.    Final Clinical Impression(s) / ED Diagnoses Final diagnoses:  Iatrogenic pneumothorax     This chart was dictated using voice recognition software.  Despite best efforts to proofread,  errors can occur which can change the documentation meaning.    Francesca Elsie CROME, MD 04/03/24 1538

## 2024-04-03 NOTE — ED Triage Notes (Addendum)
 Pt presents POV to ED from PCP for a large R. Sided pneumothorax as indicated on an outpatient xray.  Pt is ambulatory, talking in full sentences and not in any distress at this time.

## 2024-04-03 NOTE — Telephone Encounter (Signed)
 Pt is aware of results. Advised to go to ER immediately.

## 2024-04-03 NOTE — Telephone Encounter (Signed)
 Copied from CRM [id number]. Topic: Clinical - Lab/Test Results >> Apr 03, 2024 11:51 AM Winona SAUNDERS wrote: Att: Elida Sora calling from [facility] radiology with stat imaging results. They are now aware Dr. Aletha has reviewed the results and will amend this call. Please follow up with pt DG Chest 2 View: Result Notes  Connie Aletha, MD 04/03/2024 11:52 AM EDT   Patient has pneumothorax right side- she was correct She needs to go to  [facility] ER immediately.  Thanks.

## 2024-04-03 NOTE — ED Notes (Addendum)
 MD Francesca, this RN and patient signed paper consent form for chest tube.  Placed in medical records to be scanned into electronic chart.

## 2024-04-04 ENCOUNTER — Telehealth: Payer: Self-pay | Admitting: Pulmonary Disease

## 2024-04-04 ENCOUNTER — Inpatient Hospital Stay (HOSPITAL_COMMUNITY)

## 2024-04-04 DIAGNOSIS — I700 Atherosclerosis of aorta: Secondary | ICD-10-CM | POA: Diagnosis not present

## 2024-04-04 DIAGNOSIS — T797XXA Traumatic subcutaneous emphysema, initial encounter: Secondary | ICD-10-CM | POA: Diagnosis not present

## 2024-04-04 DIAGNOSIS — Z981 Arthrodesis status: Secondary | ICD-10-CM | POA: Diagnosis not present

## 2024-04-04 DIAGNOSIS — R918 Other nonspecific abnormal finding of lung field: Secondary | ICD-10-CM | POA: Diagnosis not present

## 2024-04-04 DIAGNOSIS — Z4682 Encounter for fitting and adjustment of non-vascular catheter: Secondary | ICD-10-CM | POA: Diagnosis not present

## 2024-04-04 DIAGNOSIS — J939 Pneumothorax, unspecified: Secondary | ICD-10-CM | POA: Diagnosis not present

## 2024-04-04 NOTE — Progress Notes (Signed)
 Mobility Specialist Progress Note:    04/04/24 1425  Mobility  Activity Ambulated with assistance (In hallway)  Level of Assistance Standby assist, set-up cues, supervision of patient - no hands on  Assistive Device None;Other (Comment) (Handrails)  Distance Ambulated (ft) 270 ft  Activity Response Tolerated well  Mobility Referral Yes  Mobility visit 1 Mobility  Mobility Specialist Start Time (ACUTE ONLY) 1304  Mobility Specialist Stop Time (ACUTE ONLY) 1316  Mobility Specialist Time Calculation (min) (ACUTE ONLY) 12 min   Received pt in bed and eager for mobility. No physical assistance needed. No c/o. Returned to room without fault. Left pt in chair. Personal belongings and call light within reach. All needs met.  Lavanda Pollack Mobility Specialist  Please contact via [employer] or  Rehab Office [phone]

## 2024-04-04 NOTE — Progress Notes (Signed)
 NAME:  Katelyn [name], MRN:  [id number], DOB:  1945-07-11, LOS: 1 ADMISSION DATE:  04/03/2024, CONSULTATION DATE: 04/03/2024 REFERRING MD: Marsha Ada MD, CHIEF COMPLAINT: Recurrent pneumothorax  History of Present Illness:   The patient presents with recurrent pneumothorax following trigger point injections. She is accompanied by a family member.  Recurrent pneumothorax - Recurrent pneumothorax following trigger point injections, most recently on the right side after injection in the shoulder and neck area - Immediate onset of symptoms after the recent injection, prompting emergency room evaluation and confirmation of pneumothorax - Previous pneumothorax occurred on the left side in 2023, also following trigger point injections  Chest tube insertion site pain - Significant pain at the site of chest tube insertion - Pain is more severe compared to the previous pneumothorax episode  Musculoskeletal history - History of seven back surgeries - Presence of extensive spinal hardware  Tobacco use history - Ceased smoking approximately thirty years ago - Prior history of smoking about one pack of cigarettes per week - No use of vaping or marijuana  Pertinent  Medical History    has a past medical history of Anxiety, Arthritis, Heart murmur, Hemorrhoids, Hyperlipidemia, Hypertension, Migraines, and Osteoporosis.   Significant Hospital Events: Including procedures, antibiotic start and stop dates in addition to other pertinent events     Interim History / Subjective:    Objective    Blood pressure 104/78, pulse 96, temperature 98.5 F (36.9 C), temperature source Oral, resp. rate 14, height 5' 2 (1.575 m), weight 47.2 kg, SpO2 100%.        Intake/Output Summary (Last 24 hours) at 04/04/2024 1039 Last data filed at 04/03/2024 1800 Gross per 24 hour  Intake 236 ml  Output --  Net 236 ml   Filed Weights   04/03/24 1254  Weight: 47.2 kg    Examination: Gen:      Thin  female in moderate pain due chest tube insertion site HEENT:  EOMI, sclera anicteric Neck:     No masses; no thyromegaly Lungs:    Clear to auscultation bilaterally; normal respiratory effort CV:         Regular rate and rhythm; no murmurs Abd:      + bowel sounds; soft, non-tender; no palpable masses, no distension Ext:    No edema; adequate peripheral perfusion Neuro: alert and oriented x 3 Psych: normal mood and affect   CT chest 04/27/2022-mild atelectasis in the lung base.  No emphysema, bullae, cysts. Chest x-ray 04/03/2024-large right-sided pneumothorax Chest x-ray 04/03/2024-reexpansion of right lung.  Tiny apical pneumothorax persists. Chest x-ray 04/03/2024-displacement of chest tube which has been retracted.  Stable tiny apical pneumothorax Chest x-ray 04/04/2024-stable apical pneumothorax  Resolved problem list   Assessment and Plan  Right-sided pneumothorax secondary to trigger point injection Recurrent right-sided pneumothorax following trigger point injection in the scapular region. Previous pneumothorax on the left side in 2023, also post-trigger point injection. No smoking-related lung damage or bullous lesions on CT. Current pneumothorax associated with significant pain, unlike the previous episode. - There is no air leak noticed on coughing or straining.  Chest tube remains on waterseal - Remove the chest tube as it is almost pulled out and is not communicating with the tiny stable apical pneumothorax.  Start 4 L oxygen to help resorb the residual pneumothorax.  Will get a chest x-ray later today and if stable she can probably go home and follow-up in pulmonary clinic. - Administer analgesics as needed.  Patient has Dilaudid  as needed  Signature:   Tavarus Poteete MD [facility] See Amion for pager  If no response to pager , please call [phone] until 7pm After 7:00 pm call Elink  [phone] 04/04/2024, 10:39 AM

## 2024-04-04 NOTE — Discharge Instructions (Signed)
 Follow-up with primary care physician in 1 week. Advised to follow-up with pulmonology as scheduled. Patient is being discharged home today

## 2024-04-04 NOTE — Plan of Care (Signed)
  Problem: Education: Goal: Knowledge of General Education information will improve Description: Including pain rating scale, medication(s)/side effects and non-pharmacologic comfort measures Outcome: Progressing   Problem: Coping: Goal: Level of anxiety will decrease Outcome: Progressing   Problem: Pain Managment: Goal: General experience of comfort will improve and/or be controlled Outcome: Progressing   Problem: Skin Integrity: Goal: Risk for impaired skin integrity will decrease Outcome: Progressing

## 2024-04-04 NOTE — Plan of Care (Signed)
 Pt is discharging home.  AVS given and explained to patient.  Problem: Education: Goal: Knowledge of General Education information will improve Description: Including pain rating scale, medication(s)/side effects and non-pharmacologic comfort measures Outcome: Adequate for Discharge   Problem: Health Behavior/Discharge Planning: Goal: Ability to manage health-related needs will improve Outcome: Adequate for Discharge   Problem: Clinical Measurements: Goal: Ability to maintain clinical measurements within normal limits will improve Outcome: Adequate for Discharge Goal: Will remain free from infection Outcome: Adequate for Discharge Goal: Diagnostic test results will improve Outcome: Adequate for Discharge Goal: Respiratory complications will improve Outcome: Adequate for Discharge Goal: Cardiovascular complication will be avoided Outcome: Adequate for Discharge

## 2024-04-04 NOTE — Telephone Encounter (Signed)
 Please make follow up visit in 2 weeks with chest x ray for pneumothorax

## 2024-04-04 NOTE — Consult Note (Deleted)
 NAME:  Katelyn [name], MRN:  [id number], DOB:  [birth year], LOS: 1 ADMISSION DATE:  04/03/2024, CONSULTATION DATE: 04/03/2024 REFERRING MD: Marsha Ada MD, CHIEF COMPLAINT: Recurrent pneumothorax  History of Present Illness:   The patient presents with recurrent pneumothorax following trigger point injections. She is accompanied by a family member.  Recurrent pneumothorax - Recurrent pneumothorax following trigger point injections, most recently on the right side after injection in the shoulder and neck area - Immediate onset of symptoms after the recent injection, prompting emergency room evaluation and confirmation of pneumothorax - Previous pneumothorax occurred on the left side in 2023, also following trigger point injections  Chest tube insertion site pain - Significant pain at the site of chest tube insertion - Pain is more severe compared to the previous pneumothorax episode  Musculoskeletal history - History of seven back surgeries - Presence of extensive spinal hardware  Tobacco use history - Ceased smoking approximately thirty years ago - Prior history of smoking about one pack of cigarettes per week - No use of vaping or marijuana  Pertinent  Medical History    has a past medical history of Anxiety, Arthritis, Heart murmur, Hemorrhoids, Hyperlipidemia, Hypertension, Migraines, and Osteoporosis.   Significant Hospital Events: Including procedures, antibiotic start and stop dates in addition to other pertinent events     Interim History / Subjective:    Objective    Blood pressure (!) 149/91, pulse 74, temperature 97.6 F (36.4 C), temperature source Oral, resp. rate 17, height 5' 2 (1.575 m), weight 47.2 kg, SpO2 98%.        Intake/Output Summary (Last 24 hours) at 04/04/2024 0949 Last data filed at 04/03/2024 1800 Gross per 24 hour  Intake 236 ml  Output --  Net 236 ml   Filed Weights   04/03/24 1254  Weight: 47.2 kg    Examination: Gen:       Thin female in moderate pain due chest tube insertion site HEENT:  EOMI, sclera anicteric Neck:     No masses; no thyromegaly Lungs:    Clear to auscultation bilaterally; normal respiratory effort CV:         Regular rate and rhythm; no murmurs Abd:      + bowel sounds; soft, non-tender; no palpable masses, no distension Ext:    No edema; adequate peripheral perfusion Neuro: alert and oriented x 3 Psych: normal mood and affect   CT chest 04/27/2022-mild atelectasis in the lung base.  No emphysema, bullae, cysts. Chest x-ray 04/03/2024-large right-sided pneumothorax Chest x-ray 04/03/2024-reexpansion of right lung.  Tiny apical pneumothorax persists. Chest x-ray 04/03/2024-displacement of chest tube which has been retracted.  Stable tiny apical pneumothorax Chest x-ray 04/04/2024-stable apical pneumothorax  Resolved problem list   Assessment and Plan  Right-sided pneumothorax secondary to trigger point injection Recurrent right-sided pneumothorax following trigger point injection in the scapular region. Previous pneumothorax on the left side in 2023, also post-trigger point injection. No smoking-related lung damage or bullous lesions on CT. Current pneumothorax associated with significant pain, unlike the previous episode. - There is no air leak noticed on coughing or straining.  Chest tube remains on waterseal - Remove the chest tube as it is almost pulled out and is not communicating with the tiny stable apical pneumothorax.  Start 4 L oxygen to help resorb the residual pneumothorax.  Will get a chest x-ray later today and if stable she can probably go home and follow-up in pulmonary clinic. - Administer analgesics as needed.  Patient has Dilaudid  as  needed  Signature:   Lillah Standre MD [facility] See Amion for pager  If no response to pager , please call [phone] until 7pm After 7:00 pm call Elink  [phone] 04/04/2024, 9:49 AM

## 2024-04-04 NOTE — Progress Notes (Addendum)
 Dr. Theophilus removed chest tube at bedside. Pt tolerated well.  As per MD, place pt on 4L O2 to help resorb the residual pneumothorax

## 2024-04-04 NOTE — Discharge Summary (Signed)
 Physician Discharge Summary  Katelyn [name] FMW:[id number] DOB: 03/06/1946 DOA: 04/03/2024  PCP: Katelyn [name] DASEN, MD  Admit date: 04/03/2024  Discharge date: 04/04/2024  Admitted From: Home  Disposition:  Home  Recommendations for Outpatient Follow-up:  Follow up with PCP in 1-2 weeks. Please obtain BMP/CBC in one week Advised to follow-up with pulmonology as scheduled. Patient is being discharged home today.  Home Health:None Equipment/Devices:None  Discharge Condition: Stable CODE STATUS:Full code Diet recommendation: Heart Healthy   Brief [facility] Course: This 78 years old female with PMH significant for tobacco use presented to the ED with right chest pain. She is found to have right-sided pneumothorax.  Patient underwent trigger point injection, later developed pain, imaging confirms pneumothorax.  Patient underwent chest tube insertion by pulmonology. She has previous pneumothorax on the left side in 2023, also post-trigger point injection. No smoking-related lung damage or bullous lesions on CT. Current pneumothorax associated with significant pain, unlike the previous episode. There is no air leak noticed on coughing or straining.  Chest tube remains on water seal. Chest tube subsequently removed.  Patient started on 4 L of supplemental oxygen to help resolve the residual pneumothorax.  Later in the day chest x-ray completed shows resolution of pneumothorax.  Pulmonology recommended patient can be discharged home.  Discharge Diagnoses:  Principal Problem:   Pneumothorax on right    Discharge Instructions  Discharge Instructions     Call MD for:  difficulty breathing, headache or visual disturbances   Complete by: As directed    Call MD for:  persistant dizziness or light-headedness   Complete by: As directed    Call MD for:  persistant nausea and vomiting   Complete by: As directed    Diet - low sodium heart healthy   Complete by: As directed    Diet  general   Complete by: As directed    Discharge instructions   Complete by: As directed    Follow-up with primary care physician in 1 week. Advised to follow-up with pulmonology as scheduled. Patient is being discharged home today   Increase activity slowly   Complete by: As directed       Allergies as of 04/04/2024       Reactions   Iodine Hives   Prednisone Hives   OK to take if pre-treated with Benadryl    Sulfa Antibiotics Swelling   Hydrocodone Hives, Itching   OK to take if pre-treated with Benadryl    Red Dye #40 (allura Red) Rash        Medication List     TAKE these medications    ALPRAZolam  0.5 MG tablet Commonly known as: XANAX  TAKE 1 TABLET BY MOUTH THREE TIMES DAILY AS NEEDED FOR ANXIETY   aspirin  81 MG tablet Take 81 mg by mouth daily.   BIOTIN PO Take 1 tablet by mouth daily.   CALCIUM PO Take 1 tablet by mouth 2 (two) times daily.   diclofenac  sodium 1 % Gel Commonly known as: VOLTAREN  Apply 1 Application topically 2 (two) times daily as needed (pain).   diphenhydrAMINE  25 MG tablet Commonly known as: BENADRYL  Take 25 mg by mouth every 6 (six) hours as needed for allergies (hives).   DULoxetine  30 MG capsule Commonly known as: CYMBALTA  Take 1 capsule (30 mg total) by mouth 2 (two) times daily.   FISH OIL PO Take 1 capsule by mouth 2 (two) times daily.   GARLIC PO Take 1 capsule by mouth daily.   hydrochlorothiazide  25 MG tablet  Commonly known as: HYDRODIURIL  Take 1 tablet by mouth once daily   LYSINE PO Take 1 tablet by mouth daily.   MAGNESIUM PO Take 1 tablet by mouth daily.   nortriptyline  10 MG capsule Commonly known as: PAMELOR  Take 20-30 mg by mouth See admin instructions. Take two capsules (20mg ) by mouth in the morning and take 3 capsules (30mg ) at night.   POTASSIUM GLUCONATE PO Take 1 tablet by mouth daily.   predniSONE 5 MG tablet Commonly known as: DELTASONE Take 5-30 mg by mouth See admin instructions.  Prednisone 5mg ; 6 day dose pack. Take 6 tablets (30mg ) by mouth over day 1, then take 5 tablets (25mg ) over day 2, then take 4 tablets (20mg ) over day 3, then take 3 tablets (15mg ) over day 4, then take 2 tablets (10mg ) over day 5, then take 1 tablet on day 6. Stop.   PRESCRIPTION MEDICATION Trigger point injections.  Once every 3 months.   simvastatin  20 MG tablet Commonly known as: ZOCOR  TAKE 1 TABLET BY MOUTH ONCE DAILY AT BEDTIME   SUMAtriptan  100 MG tablet Commonly known as: IMITREX  Take 100 mg by mouth See admin instructions. Take 1 tablet (100mg ) by mouth once as needed for migraine. May repeat dose x1 if headache persists or recurs. Max daily dose 200mg  per day.   traMADol  50 MG tablet Commonly known as: ULTRAM  Take 50-100 mg by mouth every 4 (four) hours as needed for moderate pain (pain score 4-6).   VITAMIN B COMPLEX PO Take 1 tablet by mouth daily.   VITAMIN C PO Take 1 tablet by mouth daily.   VITAMIN E PO Take 1 capsule by mouth daily.   Womens 50+ Multi Vitamin Tabs Take 1 tablet by mouth daily.        Follow-up Information     Katelyn [name] DASEN, MD Follow up in 1 week(s).   Specialty: Family Medicine Contact information: [address] [place] [address] [phone]                Allergies  Allergen Reactions   Iodine Hives   Prednisone Hives    OK to take if pre-treated with Benadryl    Sulfa Antibiotics Swelling   Hydrocodone Hives and Itching    OK to take if pre-treated with Benadryl    Red Dye #40 (Allura Red) Rash    Consultations: Pulmonology   Procedures/Studies:   Subjective: Patient was seen and examined at bedside.  Overnight events noted. Patient reports feeling much better  and wants to be discharged home.  Discharge Exam: Vitals:   04/04/24 0959 04/04/24 1547  BP:    Pulse:    Resp:    Temp:    SpO2: 100% 97%   Vitals:   04/04/24 0512 04/04/24 0956 04/04/24 0959 04/04/24 1547  BP: (!) 149/91  104/78    Pulse: 74 96    Resp: 17 14    Temp: 97.6 F (36.4 C) 98.5 F (36.9 C)    TempSrc: Oral Oral    SpO2: 98% 97% 100% 97%  Weight:      Height:        General: Pt is alert, awake, not in acute distress Cardiovascular: RRR, S1/S2 +, no rubs, no gallops Respiratory: CTA bilaterally, no wheezing, no rhonchi Abdominal: Soft, NT, ND, bowel sounds + Extremities: no edema, no cyanosis    The results of significant diagnostics from this hospitalization (including imaging, microbiology, ancillary and laboratory) are listed below for reference.  Microbiology: No results found for this or any previous visit (from the past 240 hours).   Labs: BNP (last 3 results) No results for input(s): BNP in the last 8760 hours. Basic Metabolic Panel: Recent Labs  Lab 04/03/24 1301  NA 129*  K 3.8  CL 93*  CO2 25  GLUCOSE 126*  BUN 6*  CREATININE 0.64  CALCIUM 9.6   Liver Function Tests: Recent Labs  Lab 04/03/24 1301  AST 28  ALT 36  ALKPHOS 63  BILITOT 0.7  PROT 7.0  ALBUMIN 4.0   No results for input(s): LIPASE, AMYLASE in the last 168 hours. No results for input(s): AMMONIA in the last 168 hours. CBC: Recent Labs  Lab 04/03/24 1301  WBC 7.8  NEUTROABS 5.2  HGB 15.0  HCT 44.3  MCV 93.5  PLT 403*   Cardiac Enzymes: No results for input(s): CKTOTAL, CKMB, CKMBINDEX, TROPONINI in the last 168 hours. BNP: Invalid input(s): POCBNP CBG: No results for input(s): GLUCAP in the last 168 hours. D-Dimer No results for input(s): DDIMER in the last 72 hours. Hgb A1c No results for input(s): HGBA1C in the last 72 hours. Lipid Profile No results for input(s): CHOL, HDL, LDLCALC, TRIG, CHOLHDL, LDLDIRECT in the last 72 hours. Thyroid  function studies No results for input(s): TSH, T4TOTAL, T3FREE, THYROIDAB in the last 72 hours.  Invalid input(s): FREET3 Anemia work up No results for input(s): VITAMINB12, FOLATE,  FERRITIN, TIBC, IRON, RETICCTPCT in the last 72 hours. Urinalysis    Component Value Date/Time   COLORURINE YELLOW 09/27/2023 1535   APPEARANCEUR CLEAR 09/27/2023 1535   LABSPEC 1.015 09/27/2023 1535   PHURINE 7.0 09/27/2023 1535   GLUCOSEU NEGATIVE 09/27/2023 1535   GLUCOSEU NEGATIVE 06/02/2023 1112   HGBUR NEGATIVE 09/27/2023 1535   BILIRUBINUR NEGATIVE 06/02/2023 1112   KETONESUR NEGATIVE 09/27/2023 1535   PROTEINUR NEGATIVE 09/27/2023 1535   UROBILINOGEN 0.2 06/02/2023 1112   NITRITE NEGATIVE 09/27/2023 1535   LEUKOCYTESUR TRACE (A) 09/27/2023 1535   Sepsis Labs Recent Labs  Lab 04/03/24 1301  WBC 7.8   Microbiology No results found for this or any previous visit (from the past 240 hours).   Time coordinating discharge: Over 30 minutes  SIGNED:   Darcel Dawley, MD  [facility] 04/04/2024, 3:51 PM Pager   If 7PM-7AM, please contact night-coverage

## 2024-04-04 NOTE — TOC Initial Note (Signed)
 Transition of Care [facility]) - Initial/Assessment Note    Patient Details  Name: Katelyn [name] MRN: [id number] Date of Birth: 01/29/1946  Transition of Care [facility]) CM/SW Contact:    Jeoffrey LITTIE Moose, [place] Phone Number: 04/04/2024, 9:40 AM  Clinical Narrative:                 Pt admitted from home due to right sided pneumothorax. No current TOC needs, please consult as needs arise.        Patient Goals and CMS Choice            Expected Discharge Plan and Services                                              Prior Living Arrangements/Services                       Activities of Daily Living      Permission Sought/Granted                  Emotional Assessment              Admission diagnosis:  Iatrogenic pneumothorax [J95.811] Pneumothorax on right [J93.9] Patient Active Problem List   Diagnosis Date Noted   Pneumothorax on right 04/03/2024   Benign neoplasm of skin of lower limb 02/24/2024   Inverted nipple 02/24/2024   Lentigo 02/24/2024   Seborrheic keratoses 02/24/2024   Sun-damaged skin 02/24/2024   History of lumbar spinal fusion 10/28/2023   Myalgia 10/28/2023   Dysuria 09/27/2023   Itching in the vaginal area 09/27/2023   Abnormality of gait and mobility 03/31/2023   Hematochezia 01/14/2023   Prolapsed internal hemorrhoids, grade 2 12/31/2022   Pneumothorax on left 04/04/2022   Chest tube in place 04/04/2022   Spontaneous pneumothorax 04/03/2022   Sebaceous cyst 02/16/2022   Kyphosis 08/23/2019   Post laminectomy syndrome 08/23/2019   Spinal stenosis 08/23/2019   DDD (degenerative disc disease), 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   Cervical dystonia 02/02/2016   Migraines 12/02/2015   Chronic pain syndrome 09/16/2015   GAD (generalized anxiety disorder) 09/11/2015   HTN (hypertension) 03/15/2015   HLD (hyperlipidemia) 03/15/2015   Osteoporosis 03/15/2015   Arthritis 03/15/2015   Paronychia  of finger 05/10/2013   PCP:  [name] DASEN, MD Pharmacy:   [facility] [address] [place] - [address] [place] [address] Phone: [phone] Fax: [phone]     Social Drivers of Health (SDOH) Social History: SDOH Screenings   Food Insecurity: No Food Insecurity (04/03/2024)  Housing: Low Risk  (04/03/2024)  Transportation Needs: No Transportation Needs (04/03/2024)  Utilities: Not At Risk (04/03/2024)  Alcohol Screen: Low Risk  (02/21/2024)  Depression (PHQ2-9): Low Risk  (02/24/2024)  Financial Resource Strain: Low Risk  (02/21/2024)  Physical Activity: Sufficiently Active (02/21/2024)  Social Connections: Socially Integrated (04/03/2024)  Stress: No Stress Concern Present (02/21/2024)  Tobacco Use: Low Risk  (04/03/2024)   SDOH Interventions:     Readmission Risk Interventions     No data to display

## 2024-04-04 NOTE — Progress Notes (Addendum)
 As per Dr. Theophilus ok to remove 4L O2.  O2 sats 97% room air

## 2024-04-05 ENCOUNTER — Telehealth: Payer: Self-pay

## 2024-04-05 NOTE — Transitions of Care (Post Inpatient/ED Visit) (Signed)
 04/05/2024  Name: Katelyn [name] MRN: [id number] DOB: [birth year]  Today's TOC FU Call Status: Today's TOC FU Call Status:: Successful TOC FU Call Completed TOC FU Call Complete Date: 04/05/24 Patient's Name and Date of Birth confirmed.  Transition Care Management Follow-up Telephone Call Date of Discharge: 04/04/24 Discharge Facility: [facility]) Type of Discharge: Inpatient Admission Primary Inpatient Discharge Diagnosis:: pneumothorax How have you been since you were released from the hospital?: Better Any questions or concerns?: No  Items Reviewed: Did you receive and understand the discharge instructions provided?: Yes Medications obtained,verified, and reconciled?: Yes (Medications Reviewed) Any new allergies since your discharge?: No Dietary orders reviewed?: Yes Do you have support at home?: Yes People in Home [RPT]: spouse  Medications Reviewed Today: Medications Reviewed Today     Reviewed by Emmitt Pan, LPN (Licensed Practical Nurse) on 04/05/24 at 1226  Med List Status: <None>   Medication Order Taking? Sig Documenting Provider Last Dose Status Informant  ALPRAZolam  (XANAX ) 0.5 MG tablet [id number] Yes TAKE 1 TABLET BY MOUTH THREE TIMES DAILY AS NEEDED FOR ANXIETY [name] DASEN, MD  Active Self, Pharmacy Records  Ascorbic Acid (VITAMIN C PO) [id number] Yes Take 1 tablet by mouth daily. [provider]  Active Self, Pharmacy Records  aspirin  81 MG tablet [id number] Yes Take 81 mg by mouth daily.   [provider]  Active Self, Pharmacy Records  B Complex Vitamins (VITAMIN B COMPLEX PO) [id number] Yes Take 1 tablet by mouth daily.   [provider]  Active Self, Pharmacy Records  BIOTIN PO [id number] Yes Take 1 tablet by mouth daily. [provider]  Active Self, Pharmacy Records  CALCIUM PO [id number] Yes Take 1 tablet by mouth 2 (two) times daily. [provider]  Active Self, Pharmacy Records  diclofenac   sodium (VOLTAREN ) 1 % GEL [id number] Yes Apply 1 Application topically 2 (two) times daily as needed (pain). [provider]  Active Self, Pharmacy Records  diphenhydrAMINE  (BENADRYL ) 25 MG tablet [id number] Yes Take 25 mg by mouth every 6 (six) hours as needed for allergies (hives). [provider]  Active Pharmacy Records, Self  DULoxetine  (CYMBALTA ) 30 MG capsule [id number] Yes Take 1 capsule (30 mg total) by mouth 2 (two) times daily. [name] DASEN, MD  Active Self, Pharmacy Records  GARLIC PO [id number] Yes Take 1 capsule by mouth daily. [provider]  Active Self, Pharmacy Records  hydrochlorothiazide  (HYDRODIURIL ) 25 MG tablet [id number] Yes Take 1 tablet by mouth once daily [name] DASEN, MD  Active Self, Pharmacy Records  LYSINE PO [id number] Yes Take 1 tablet by mouth daily. [provider]  Active Self, Pharmacy Records  MAGNESIUM PO [id number] Yes Take 1 tablet by mouth daily. [provider]  Active Self, Pharmacy Records  Multiple Vitamins-Minerals (WOMENS 50+ MULTI VITAMIN) TABS [id number] Yes Take 1 tablet by mouth daily. [provider]  Active Self, Pharmacy Records  nortriptyline  (PAMELOR ) 10 MG capsule [id number] Yes Take 20-30 mg by mouth See admin instructions. Take two capsules (20mg ) by mouth in the morning and take 3 capsules (30mg ) at night. [provider]  Active Self, Pharmacy Records  Omega-3 Fatty Acids (FISH OIL PO) [id number] Yes Take 1 capsule by mouth 2 (two) times daily. [provider]  Active Self, Pharmacy Records  POTASSIUM GLUCONATE PO [id number] Yes Take 1 tablet by mouth daily.   [provider]  Active Self, Pharmacy Records  predniSONE (DELTASONE) 5 MG tablet [id number] Yes Take 5-30 mg by  mouth See admin instructions. Prednisone 5mg ; 6 day dose pack. Take 6 tablets (30mg ) by mouth over day 1, then take 5 tablets (25mg ) over day 2, then take 4 tablets (20mg ) over day 3, then take 3  tablets (15mg ) over day 4, then take 2 tablets (10mg ) over day 5, then take 1 tablet on day 6. Stop. [provider]  Active Pharmacy Records, Self           Med Note (COFFELL, JON HERO   Tue Apr 04, 2024  9:31 AM) LF sold 04/01/24 #21, 6 DS.  PRESCRIPTION MEDICATION [id number] Yes Trigger point injections.  Once every 3 months. [provider]  Active Self, Pharmacy Records  simvastatin  (ZOCOR ) 20 MG tablet [id number] Yes TAKE 1 TABLET BY MOUTH ONCE DAILY AT BEDTIME [name] DASEN, MD  Active Self, Pharmacy Records  SUMAtriptan  (IMITREX ) 100 MG tablet [id number] Yes Take 100 mg by mouth See admin instructions. Take 1 tablet (100mg ) by mouth once as needed for migraine. May repeat dose x1 if headache persists or recurs. Max daily dose 200mg  per day. [provider]  Active Self, Pharmacy Records  traMADol  (ULTRAM ) 50 MG tablet [id number] Yes Take 50-100 mg by mouth every 4 (four) hours as needed for moderate pain (pain score 4-6). [provider]  Active Self, Pharmacy Records  VITAMIN E PO [id number] Yes Take 1 capsule by mouth daily. [provider]  Active Self, Pharmacy Records            Home Care and Equipment/Supplies: Were Home Health Services Ordered?: NA Any new equipment or medical supplies ordered?: NA  Functional Questionnaire: Do you need assistance with bathing/showering or dressing?: Yes Do you need assistance with meal preparation?: Yes Do you need assistance with eating?: No Do you have difficulty maintaining continence: No Do you need assistance with getting out of bed/getting out of a chair/moving?: No Do you have difficulty managing or taking your medications?: No  Follow up appointments reviewed: PCP Follow-up appointment confirmed?: Yes Date of PCP follow-up appointment?: 04/11/24 Follow-up Provider: [facility] Follow-up appointment confirmed?: Yes Date of Specialist follow-up appointment?:  04/27/24 Follow-Up Specialty Provider:: pulmo Do you need transportation to your follow-up appointment?: No Do you understand care options if your condition(s) worsen?: Yes-patient verbalized understanding    SIGNATURE Julian Lemmings, LPN [facility] Nurse Health Advisor Direct Dial [phone]

## 2024-04-05 NOTE — Telephone Encounter (Signed)
 Chest-x-ray completed, patient was scheduled to follow-up. NFN

## 2024-04-11 ENCOUNTER — Ambulatory Visit (INDEPENDENT_AMBULATORY_CARE_PROVIDER_SITE_OTHER): Admitting: Family Medicine

## 2024-04-11 VITALS — BP 138/72 | HR 82 | Temp 98.1°F | Ht 62.0 in | Wt 104.0 lb

## 2024-04-11 DIAGNOSIS — E871 Hypo-osmolality and hyponatremia: Secondary | ICD-10-CM

## 2024-04-11 DIAGNOSIS — J95811 Postprocedural pneumothorax: Secondary | ICD-10-CM

## 2024-04-11 LAB — BASIC METABOLIC PANEL WITHOUT GFR
BUN: 9 mg/dL (ref 7–25)
CO2: 32 mmol/L (ref 20–32)
Calcium: 9.9 mg/dL (ref 8.6–10.4)
Chloride: 99 mmol/L (ref 98–110)
Creat: 0.68 mg/dL (ref 0.60–1.00)
Glucose, Bld: 63 mg/dL — ABNORMAL LOW (ref 65–99)
Potassium: 4.3 mmol/L (ref 3.5–5.3)
Sodium: 138 mmol/L (ref 135–146)

## 2024-04-11 LAB — EXTRA LAV TOP TUBE

## 2024-04-11 MED ORDER — BENZONATATE 100 MG PO CAPS: 100.0000 mg | ORAL_CAPSULE | Freq: Three times a day (TID) | ORAL | 0 refills | Status: AC | PRN

## 2024-04-11 NOTE — Progress Notes (Signed)
 Subjective:    Patient ID: Katelyn [name], female    DOB: [birth year], 78 y.o.   MRN: [id number]   Patient recently was admitted to the hospital with a right-sided pneumothorax.  This occurred after patient had trigger point injections.  She states that immediately after developing the trigger point injections, she felt pain in her right posterior chest.  She also developed increasing shortness of breath and pleurisy.  She went to the hospital where she was found to have an apical pneumothorax.  A chest tube was placed for 24 hours.  The chest tube has been discontinued.  She was monitored and then released from the hospital.  She denies any shortness of breath.  She denies any chest pain.  She denies any pleurisy.  She denies any hemoptysis.  She denies any fevers or chills.  She does have an occasional cough that she has had ever since the hospital but it is dry and nonproductive. Past Medical History:  Diagnosis Date   Anxiety    Arthritis    cervical and lumbar DDD   Heart murmur    Hemorrhoids    Hyperlipidemia    Hypertension    Migraines    Due to neck pain   Osteoporosis    Past Surgical History:  Procedure Laterality Date   BUNIONECTOMY     right   CERVICAL FUSION     x3   COLONOSCOPY     FLEXIBLE SIGMOIDOSCOPY N/A 01/14/2023   Procedure: FLEXIBLE SIGMOIDOSCOPY;  Surgeon: Aneita Gwendlyn DASEN, MD;  Location: [facility];  Service: Gastroenterology;  Laterality: N/A;   HEMORRHOID BANDING     HEMOSTASIS CLIP PLACEMENT  01/14/2023   Procedure: HEMOSTASIS CLIP PLACEMENT;  Surgeon: Aneita Gwendlyn DASEN, MD;  Location: [facility];  Service: Gastroenterology;;   LUMBAR DISC SURGERY     POLYPECTOMY     radiofrequency ablasion     SPINE SURGERY      Dr. Gust (x2, last Nov/15) C3-T4   UPPER GASTROINTESTINAL ENDOSCOPY     Current Outpatient Medications on File Prior to Visit  Medication Sig Dispense Refill   ALPRAZolam  (XANAX ) 0.5 MG tablet TAKE 1 TABLET BY MOUTH THREE TIMES  DAILY AS NEEDED FOR ANXIETY 90 tablet 0   Ascorbic Acid (VITAMIN C PO) Take 1 tablet by mouth daily.     aspirin  81 MG tablet Take 81 mg by mouth daily.       B Complex Vitamins (VITAMIN B COMPLEX PO) Take 1 tablet by mouth daily.       BIOTIN PO Take 1 tablet by mouth daily.     CALCIUM PO Take 1 tablet by mouth 2 (two) times daily.     diclofenac  sodium (VOLTAREN ) 1 % GEL Apply 1 Application topically 2 (two) times daily as needed (pain).     diphenhydrAMINE  (BENADRYL ) 25 MG tablet Take 25 mg by mouth every 6 (six) hours as needed for allergies (hives).     DULoxetine  (CYMBALTA ) 30 MG capsule Take 1 capsule (30 mg total) by mouth 2 (two) times daily. 180 capsule 3   GARLIC PO Take 1 capsule by mouth daily.     hydrochlorothiazide  (HYDRODIURIL ) 25 MG tablet Take 1 tablet by mouth once daily 180 tablet 0   LYSINE PO Take 1 tablet by mouth daily.     MAGNESIUM PO Take 1 tablet by mouth daily.     Multiple Vitamins-Minerals (WOMENS 50+ MULTI VITAMIN) TABS Take 1 tablet by mouth daily.     nortriptyline  (  PAMELOR ) 10 MG capsule Take 20-30 mg by mouth See admin instructions. Take two capsules (20mg ) by mouth in the morning and take 3 capsules (30mg ) at night.     Omega-3 Fatty Acids (FISH OIL PO) Take 1 capsule by mouth 2 (two) times daily.     POTASSIUM GLUCONATE PO Take 1 tablet by mouth daily.       predniSONE (DELTASONE) 5 MG tablet Take 5-30 mg by mouth See admin instructions. Prednisone 5mg ; 6 day dose pack. Take 6 tablets (30mg ) by mouth over day 1, then take 5 tablets (25mg ) over day 2, then take 4 tablets (20mg ) over day 3, then take 3 tablets (15mg ) over day 4, then take 2 tablets (10mg ) over day 5, then take 1 tablet on day 6. Stop.     PRESCRIPTION MEDICATION Trigger point injections.  Once every 3 months.     simvastatin  (ZOCOR ) 20 MG tablet TAKE 1 TABLET BY MOUTH ONCE DAILY AT BEDTIME 90 tablet 3   SUMAtriptan  (IMITREX ) 100 MG tablet Take 100 mg by mouth See admin instructions. Take 1  tablet (100mg ) by mouth once as needed for migraine. May repeat dose x1 if headache persists or recurs. Max daily dose 200mg  per day.     traMADol  (ULTRAM ) 50 MG tablet Take 50-100 mg by mouth every 4 (four) hours as needed for moderate pain (pain score 4-6).  0   VITAMIN E PO Take 1 capsule by mouth daily.     No current facility-administered medications on file prior to visit.   Allergies  Allergen Reactions   Iodine Hives   Prednisone Hives    OK to take if pre-treated with Benadryl    Sulfa Antibiotics Swelling   Hydrocodone Hives and Itching    OK to take if pre-treated with Benadryl    Red Dye #40 (Allura Red) Rash   Social History   Socioeconomic History   Marital status: Married    Spouse name: Fairy   Number of children: 2   Years of education: [employer] education level: Associate degree: occupational, Scientist, product/process development, or vocational program  Occupational History   Occupation: Retired  Tobacco Use   Smoking status: Never   Smokeless tobacco: Never  Vaping Use   Vaping status: Never Used  Substance and Sexual Activity   Alcohol use: Yes    Alcohol/week: 7.0 standard drinks of alcohol    Types: 7 Glasses of wine per week    Comment: 1-2 rare   Drug use: No   Sexual activity: Yes  Other Topics Concern   Not on file  Social History Narrative   Lives with husband   Caffeine use: none   Social Drivers of Corporate investment banker Strain: Low Risk  (02/21/2024)   Overall Financial Resource Strain (CARDIA)    Difficulty of Paying Living Expenses: Not hard at all  Food Insecurity: No Food Insecurity (04/03/2024)   Hunger Vital Sign    Worried About Running Out of Food in the Last Year: Never true    Ran Out of Food in the Last Year: Never true  Transportation Needs: No Transportation Needs (04/03/2024)   PRAPARE - Administrator, Civil Service (Medical): No    Lack of Transportation (Non-Medical): No  Physical Activity: Sufficiently Active  (02/21/2024)   Exercise Vital Sign    Days of Exercise per Week: 7 days    Minutes of Exercise per Session: 30 min  Stress: No Stress Concern Present (02/21/2024)   [place]  [employer]stitute of Occupational Health - Occupational Stress Questionnaire    Feeling of Stress: Not at all  Social Connections: Socially Integrated (04/03/2024)   Social Connection and Isolation Panel    Frequency of Communication with Friends and Family: More than three times a week    Frequency of Social Gatherings with Friends and Family: Three times a week    Attends Religious Services: More than 4 times per year    Active Member of Clubs or Organizations: Yes    Attends Banker Meetings: 1 to 4 times per year    Marital Status: Married  Catering manager Violence: Not At Risk (04/03/2024)   Humiliation, Afraid, Rape, and Kick questionnaire    Fear of Current or Ex-Partner: No    Emotionally Abused: No    Physically Abused: No    Sexually Abused: No   Family History  Problem Relation Age of Onset   Colon polyps Mother    Arthritis Mother    Hearing loss Mother    Hyperlipidemia Mother    Hypertension Mother    Stroke Mother    Heart disease Mother    Colon polyps Father    Arthritis Father    Heart disease Father    Hyperlipidemia Father    Arthritis Maternal Grandmother    Arthritis Maternal Grandfather    Arthritis Paternal Grandmother    Arthritis Paternal Grandfather    Colon cancer Neg Hx    Esophageal cancer Neg Hx    Stomach cancer Neg Hx    Rectal cancer Neg Hx    Breast cancer Neg Hx    BRCA 1/2 Neg Hx       Review of Systems  All other systems reviewed and are negative.      Objective:   Physical Exam Vitals reviewed.  Constitutional:      General: She is not in acute distress.    Appearance: She is well-developed. She is not diaphoretic.  HENT:     Head: Normocephalic and atraumatic.     Right Ear: External ear normal.     Left Ear: External ear normal.     Nose:  Nose normal.     Mouth/Throat:     Pharynx: No oropharyngeal exudate.  Eyes:     General: No scleral icterus.       Right eye: No discharge.        Left eye: No discharge.     Conjunctiva/sclera: Conjunctivae normal.     Pupils: Pupils are equal, round, and reactive to light.  Neck:     Thyroid : No thyromegaly.     Vascular: No JVD.     Trachea: No tracheal deviation.  Cardiovascular:     Rate and Rhythm: Normal rate and regular rhythm.     Heart sounds: No murmur heard.    No friction rub. No gallop.  Pulmonary:     Effort: Pulmonary effort is normal. No respiratory distress.     Breath sounds: Normal breath sounds. No stridor. No wheezing, rhonchi or rales.  Chest:     Chest wall: No tenderness.  Abdominal:     General: Bowel sounds are normal. There is no distension.     Palpations: Abdomen is soft. There is no mass.     Tenderness: There is no abdominal tenderness. There is no guarding or rebound.  Musculoskeletal:        General: No tenderness. Normal range of motion.     Cervical back: Normal range of motion and  neck supple.  Lymphadenopathy:     Cervical: No cervical adenopathy.  Skin:    General: Skin is warm.     Coloration: Skin is not pale.     Findings: No erythema or rash.  Neurological:     Mental Status: She is alert and oriented to person, place, and time.     Cranial Nerves: No cranial nerve deficit.     Motor: No abnormal muscle tone.     Coordination: Coordination normal.     Deep Tendon Reflexes: Reflexes are normal and symmetric.  Psychiatric:        Behavior: Behavior normal.        Thought Content: Thought content normal.        Judgment: Judgment normal.    Chest tube site just inferior to the right axilla appears well-healed with no secondary cellulitis or bruising       Assessment & Plan:  Hyponatremia - Plan: Basic Metabolic Panel Without GFR  Postprocedural pneumothorax Pneumothorax appears to be healing well with no complications.   Specifically there is no evidence on physical exam today for worsening pneumothorax.  There are no signs or symptoms of an empyema or a secondary pneumonia.  She did have hyponatremia in the hospital so I will recheck a BMP to monitor her sodium level.  Otherwise she is doing well with no concerns.  I did give her Tessalon Perles to use as needed for cough to avoid possibly reinjuring herself from coughing too hard

## 2024-04-13 ENCOUNTER — Ambulatory Visit: Payer: Self-pay | Admitting: Family Medicine

## 2024-04-27 ENCOUNTER — Ambulatory Visit: Admitting: Pulmonary Disease

## 2024-04-27 ENCOUNTER — Ambulatory Visit

## 2024-04-27 ENCOUNTER — Encounter: Payer: Self-pay | Admitting: Pulmonary Disease

## 2024-04-27 VITALS — BP 132/86 | HR 71 | Temp 98.1°F | Ht 61.0 in | Wt 103.0 lb

## 2024-04-27 DIAGNOSIS — Z8709 Personal history of other diseases of the respiratory system: Secondary | ICD-10-CM

## 2024-04-27 DIAGNOSIS — J9383 Other pneumothorax: Secondary | ICD-10-CM

## 2024-04-27 DIAGNOSIS — Z87891 Personal history of nicotine dependence: Secondary | ICD-10-CM | POA: Diagnosis not present

## 2024-04-27 DIAGNOSIS — J939 Pneumothorax, unspecified: Secondary | ICD-10-CM | POA: Diagnosis not present

## 2024-04-27 DIAGNOSIS — Z09 Encounter for follow-up examination after completed treatment for conditions other than malignant neoplasm: Secondary | ICD-10-CM | POA: Diagnosis not present

## 2024-04-27 NOTE — Patient Instructions (Addendum)
    VISIT SUMMARY: You had a follow-up visit after your recent hospitalization for a recurrent collapsed lung. Your chest tube site pain has significantly improved, and we are exploring alternative treatments for your neck pain to avoid further complications.  YOUR PLAN: RECURRENT PNEUMOTHORAX: You had a second episode of a collapsed lung, which required hospitalization and a chest tube. Current imaging shows it has mostly resolved, with only a trace left. -We have ordered a chest x-ray to confirm the resolution of the trace pneumothorax. -We will communicate the x-ray results to you via MyChart.  CHEST TUBE SITE PAIN: You had significant pain at the site where the chest tube was inserted, but it has now improved. -No further action is needed as the pain has resolved.  NECK PAIN MANAGEMENT: We are looking for alternative treatments for your neck pain to avoid another collapsed lung. -Avoid trigger point injections and dry needling as they may cause complications.                          Contains text generated by Abridge.                               Contains text generated by Abridge.

## 2024-04-27 NOTE — Progress Notes (Signed)
 Katelyn [name]    [id number]    January 02, 1946  Primary Care Physician:Pickard, Butler DASEN, MD  Referring Physician: [name] DASEN, MD [address] [place],  [place] [address]  Chief complaint: Follow-up for recurrent pneumothorax  HPI: 78 y.o. who  has a past medical history of Anxiety, Arthritis, Heart murmur, Hemorrhoids, Hyperlipidemia, Hypertension, Migraines, and Osteoporosis.  Discussed the use of AI scribe software for clinical note transcription with the patient, who gave verbal consent to proceed.  History of Present Illness Katelyn [name] is a 78 year old female who presents for follow-up after a recent hospitalization for recurrent pneumothorax.  Recurrent pneumothorax - Hospitalized recently for recurrent pneumothorax following a trigger point injection, marking the second episode of collapsed lung. - First pneumothorax also required hospitalization and chest tube placement. - Current imaging shows resolution except for a trace pneumothorax. - No further trigger point injections since the last incident. - CT scan performed in 2023 as part of prior pneumothorax workup.  Chest tube site pain - Significant improvement in pain at the right chest tube insertion site. - No current complaints of pain at the site.  Neck pain management - Exploring alternative treatments for neck pain to avoid recurrence of pneumothorax. - Previously tried dry needling, which aggravated her neck condition.  Tobacco use history - History of tobacco use, quit 30 years ago.  Outpatient Encounter Medications as of 04/27/2024  Medication Sig   Ascorbic Acid (VITAMIN C PO) Take 1 tablet by mouth daily.   aspirin  81 MG tablet Take 81 mg by mouth daily.     B Complex Vitamins (VITAMIN B COMPLEX PO) Take 1 tablet by mouth daily.     BIOTIN PO Take 1 tablet by mouth daily.   CALCIUM PO Take 1 tablet by mouth 2 (two) times daily.   diclofenac  sodium (VOLTAREN ) 1 % GEL  Apply 1 Application topically 2 (two) times daily as needed (pain).   diphenhydrAMINE  (BENADRYL ) 25 MG tablet Take 25 mg by mouth every 6 (six) hours as needed for allergies (hives).   DULoxetine  (CYMBALTA ) 30 MG capsule Take 1 capsule (30 mg total) by mouth 2 (two) times daily.   GARLIC PO Take 1 capsule by mouth daily.   hydrochlorothiazide  (HYDRODIURIL ) 25 MG tablet Take 1 tablet by mouth once daily   LYSINE PO Take 1 tablet by mouth daily.   MAGNESIUM PO Take 1 tablet by mouth daily.   Multiple Vitamins-Minerals (WOMENS 50+ MULTI VITAMIN) TABS Take 1 tablet by mouth daily.   nortriptyline  (PAMELOR ) 10 MG capsule Take 20-30 mg by mouth See admin instructions. Take two capsules (20mg ) by mouth in the morning and take 3 capsules (30mg ) at night.   Omega-3 Fatty Acids (FISH OIL PO) Take 1 capsule by mouth 2 (two) times daily.   POTASSIUM GLUCONATE PO Take 1 tablet by mouth daily.     simvastatin  (ZOCOR ) 20 MG tablet TAKE 1 TABLET BY MOUTH ONCE DAILY AT BEDTIME   SUMAtriptan  (IMITREX ) 100 MG tablet Take 100 mg by mouth See admin instructions. Take 1 tablet (100mg ) by mouth once as needed for migraine. May repeat dose x1 if headache persists or recurs. Max daily dose 200mg  per day.   traMADol  (ULTRAM ) 50 MG tablet Take 50-100 mg by mouth every 4 (four) hours as needed for moderate pain (pain score 4-6).   VITAMIN E PO Take 1 capsule by mouth daily.   ALPRAZolam  (XANAX ) 0.5 MG  tablet TAKE 1 TABLET BY MOUTH THREE TIMES DAILY AS NEEDED FOR ANXIETY   benzonatate (TESSALON) 100 MG capsule Take 1 capsule (100 mg total) by mouth 3 (three) times daily as needed for cough.   predniSONE (DELTASONE) 5 MG tablet Take 5-30 mg by mouth See admin instructions. Prednisone 5mg ; 6 day dose pack. Take 6 tablets (30mg ) by mouth over day 1, then take 5 tablets (25mg ) over day 2, then take 4 tablets (20mg ) over day 3, then take 3 tablets (15mg ) over day 4, then take 2 tablets (10mg ) over day 5, then take 1 tablet on day 6.  Stop. (Patient not taking: Reported on 04/27/2024)   PRESCRIPTION MEDICATION Trigger point injections.  Once every 3 months.   No facility-administered encounter medications on file as of 04/27/2024.     Physical Exam: Today's Vitals   04/27/24 1325  BP: 132/86  Pulse: 71  Temp: 98.1 F (36.7 C)  TempSrc: Oral  SpO2: 97%  Weight: 103 lb (46.7 kg)  Height: 5' 1 (1.549 m)   Body mass index is 19.46 kg/m.  Physical Exam GEN: No acute distress. CV: Regular rate and rhythm, no murmurs. LUNGS: Clear to auscultation bilaterally, normal respiratory effort. SKIN JOINTS: Warm and dry, no rash.    Data Reviewed: Imaging: CT chest 04/27/2022-resolved pneumothorax without residual abnormality.  No underlying emphysema or predisposing cause for pneumothorax.  Chest x-ray 04/04/2024-persistent trace right apical pneumothorax.  Small subcutaneous chest wall emphysema. Respiratory consulted basis. I have reviewed the images personally.  PFTs:  Labs: Assessment & Plan Right-sided pneumothorax status post chest tube, resolved Resolved right-sided pneumothorax following chest tube placement. Previous episode in 2023 with no underlying lung abnormality identified on CT. No recent smoking history, quit 30 years ago.  Repeated episodes are likely from trigger point injection and she has been advised to avoid it.  No current pain at the site of previous chest tube placement. No need for repeat CT as no underlying lung abnormality was found previously. - Ordered chest x-ray to confirm resolution of trace pneumothorax - Will communicate x-ray results via MyChart  Recommendations: Follow-up chest x-ray  I personally spent a total of 35 minutes in the care of the patient today including preparing to see the patient, getting/reviewing separately obtained history, performing a medically appropriate exam/evaluation, placing orders, referring and communicating with other health care professionals,  documenting clinical information in the EHR, and independently interpreting results.   Lonna Coder MD [facility] 04/27/2024, 1:47 PM  CC: [name] DASEN, MD

## 2024-05-03 ENCOUNTER — Ambulatory Visit: Payer: Self-pay | Admitting: Pulmonary Disease

## 2024-05-03 ENCOUNTER — Ambulatory Visit

## 2024-05-03 DIAGNOSIS — J9383 Other pneumothorax: Secondary | ICD-10-CM

## 2024-05-06 ENCOUNTER — Other Ambulatory Visit: Payer: Self-pay | Admitting: Family Medicine

## 2024-05-09 NOTE — Telephone Encounter (Signed)
 Requested medication (s) are due for refill today - yes  Requested medication (s) are on the active medication list -yes  Future visit scheduled -yes  Last refill: 03/27/24 #90  Notes to clinic: non delegated Rx  Requested Prescriptions  Pending Prescriptions Disp Refills   ALPRAZolam  (XANAX ) 0.5 MG tablet [Pharmacy Med Name: ALPRAZolam  0.5 MG Oral Tablet] 90 tablet 0    Sig: TAKE 1 TABLET BY MOUTH THREE TIMES DAILY AS NEEDED FOR ANXIETY     Not Delegated - Psychiatry: Anxiolytics/Hypnotics 2 Failed - 05/09/2024 12:51 PM      Failed - This refill cannot be delegated      Failed - Urine Drug Screen completed in last 360 days      Passed - Patient is not pregnant      Passed - Valid encounter within last 6 months    Recent Outpatient Visits           4 weeks ago Hyponatremia   [place] [facility] Pickard, Butler DASEN, MD   1 month ago Shortness of breath   [place] [facility] Aletha Bene, MD   2 months ago Routine general medical examination at a health care facility   [place] [facility] [name] DASEN, MD   6 months ago Degeneration of intervertebral disc of lumbar region with discogenic back pain and lower extremity pain   [place] [facility] Duanne, Butler DASEN, MD   7 months ago Dysuria   [place] [facility] Aletha Bene, MD                 Requested Prescriptions  Pending Prescriptions Disp Refills   ALPRAZolam  (XANAX ) 0.5 MG tablet [Pharmacy Med Name: ALPRAZolam  0.5 MG Oral Tablet] 90 tablet 0    Sig: TAKE 1 TABLET BY MOUTH THREE TIMES DAILY AS NEEDED FOR ANXIETY     Not Delegated - Psychiatry: Anxiolytics/Hypnotics 2 Failed - 05/09/2024 12:51 PM      Failed - This refill cannot be delegated      Failed - Urine Drug Screen completed in last 360 days      Passed - Patient is not pregnant      Passed - Valid encounter within last 6 months     Recent Outpatient Visits           4 weeks ago Hyponatremia   [place] [facility] [name] DASEN, MD   1 month ago Shortness of breath   [place] [facility] Aletha Bene, MD   2 months ago Routine general medical examination at a health care facility   [place] [facility] [name] DASEN, MD   6 months ago Degeneration of intervertebral disc of lumbar region with discogenic back pain and lower extremity pain   [place] [facility] [name] DASEN, MD   7 months ago Dysuria   [place] [facility] Aletha Bene, MD

## 2024-05-12 NOTE — Telephone Encounter (Signed)
 Copied from CRM [id number]. Topic: Clinical - Prescription Issue >> May 12, 2024 10:20 AM Ashley R wrote: Reason for CRM: Reason for CRM: ALPRAZolam  (XANAX ) 0.5 MG tablet - two pharmacy requests since last CRM. Past due by a few weeks.

## 2024-05-26 ENCOUNTER — Other Ambulatory Visit: Payer: Self-pay | Admitting: Physician Assistant

## 2024-05-26 DIAGNOSIS — M48062 Spinal stenosis, lumbar region with neurogenic claudication: Secondary | ICD-10-CM

## 2024-05-26 DIAGNOSIS — Z981 Arthrodesis status: Secondary | ICD-10-CM

## 2024-05-26 DIAGNOSIS — M5416 Radiculopathy, lumbar region: Secondary | ICD-10-CM

## 2024-06-05 ENCOUNTER — Ambulatory Visit
Admission: RE | Admit: 2024-06-05 | Discharge: 2024-06-05 | Disposition: A | Source: Ambulatory Visit | Attending: Physician Assistant | Admitting: Physician Assistant

## 2024-06-05 DIAGNOSIS — M5416 Radiculopathy, lumbar region: Secondary | ICD-10-CM

## 2024-06-05 DIAGNOSIS — M48062 Spinal stenosis, lumbar region with neurogenic claudication: Secondary | ICD-10-CM

## 2024-06-05 DIAGNOSIS — Z981 Arthrodesis status: Secondary | ICD-10-CM

## 2024-06-19 ENCOUNTER — Ambulatory Visit

## 2024-06-19 DIAGNOSIS — J9383 Other pneumothorax: Secondary | ICD-10-CM | POA: Diagnosis not present

## 2024-06-23 ENCOUNTER — Other Ambulatory Visit: Payer: Self-pay | Admitting: Family Medicine

## 2024-06-23 DIAGNOSIS — G894 Chronic pain syndrome: Secondary | ICD-10-CM

## 2024-07-04 NOTE — Progress Notes (Signed)
 PROCEDURE PERFORMED:  Therapeutic right L5 selective nerve root block.    REASON FOR PROCEDURES: Lumbar radicular pain.    SURGEON:   Debby Kansky, M.D.  PROCEDURE:  After obtaining informed consent, the patient was placed in the prone position on the fluoroscopy table. Blood pressure, pulse, and pulse oximetry was assessed pre and post-procedure. The lumbar skin was prepped and draped in a sterile fashion with Chloraprep and sterile towels.    A #25 gauge 3.5 inch spinal needle was placed under fluoroscopic control into the right L5 foramen just under the mid-portion of the pedicle.  Approximately 1-2 mL of 200 mg/mL Omnipaque contrast dye was injected, demonstrating perineural spread.  Key fluoroscopic images of the procedure were saved to a local server. A total of 0.8 mL of 2% preservative free lidocaine  and 1.5 mL of 10 mg per mL of dexamethasone  was instilled around the nerve root.    A #25 gauge 3.5 inch spinal needle was placed under fluoroscopic control into the right L4 foramen just under the mid-portion of the pedicle.  Approximately 1-2 mL of 200 mg/mL Omnipaque contrast dye was injected, demonstrating perineural spread.  Key fluoroscopic images of the procedure were saved to a local server. A total of 0.8 mL of 2% preservative free lidocaine  and 1.5 mL of 10 mg per mL of dexamethasone  was instilled around the nerve root.    The patient tolerated the procedure well and was transitioned to the post procedure holding area uneventfully.  The patient was monitored for adverse allergic, paralytic and hypertensive reactions.  The patient was discharged without complication.

## 2024-07-19 ENCOUNTER — Other Ambulatory Visit: Payer: Self-pay | Admitting: Family Medicine

## 2024-07-19 DIAGNOSIS — G894 Chronic pain syndrome: Secondary | ICD-10-CM

## 2024-07-19 NOTE — Telephone Encounter (Signed)
 Copied from CRM #[id number]. Topic: Clinical - Prescription Issue >> Jul 19, 2024 11:28 AM Sophia H wrote: Reason for CRM: Patient states insurance advised that RX for SUMAtriptan  (IMITREX ) 100 MG tablet has to be for a 30 day supply. Please clarify with insurance.    [facility] [address], [place] - [id number] [place] [address] Phone: [phone]  Fax: [phone]

## 2024-07-20 MED ORDER — SUMATRIPTAN SUCCINATE 100 MG PO TABS: 100.0000 mg | ORAL_TABLET | ORAL | 0 refills | Status: AC | PRN

## 2024-07-20 NOTE — Telephone Encounter (Signed)
 Please see note [employer] requesting 30 day supply)  Last refill 06/23/24 #10   Requested Prescriptions  Pending Prescriptions Disp Refills   SUMAtriptan  (IMITREX ) 100 MG tablet 10 tablet 0    Sig: May repeat in 2 hours if headache persists or recurs.     Neurology:  Migraine Therapy - Triptan Passed - 07/20/2024 12:55 PM      Passed - Last BP in normal range    BP Readings from Last 1 Encounters:  04/27/24 132/86         Passed - Valid encounter within last 12 months    Recent Outpatient Visits           3 months ago Hyponatremia   [place] [facility] Duanne, Butler DASEN, MD   3 months ago Shortness of breath   [place] [facility] Aletha Bene, MD   4 months ago Routine general medical examination at a health care facility   [place] [facility] [name] DASEN, MD   8 months ago Degeneration of intervertebral disc of lumbar region with discogenic back pain and lower extremity pain   [place] [facility] [name] DASEN, MD   9 months ago Dysuria   [place] [facility] Aletha Bene, MD

## 2025-02-27 ENCOUNTER — Encounter: Admitting: Family Medicine

## 2025-03-05 ENCOUNTER — Encounter: Admitting: Family Medicine
